# Patient Record
Sex: Female | Born: 1965
Health system: Southern US, Community
[De-identification: ages and names within clinical notes are randomized; demographics above are authoritative.]

## PROBLEM LIST (undated history)

## (undated) ENCOUNTER — Emergency Department (HOSPITAL_COMMUNITY): Admission: EM | Payer: 59

## (undated) DIAGNOSIS — F32A Depression, unspecified: Secondary | ICD-10-CM

## (undated) DIAGNOSIS — F988 Other specified behavioral and emotional disorders with onset usually occurring in childhood and adolescence: Secondary | ICD-10-CM

## (undated) DIAGNOSIS — G2581 Restless legs syndrome: Secondary | ICD-10-CM

## (undated) DIAGNOSIS — I5030 Unspecified diastolic (congestive) heart failure: Secondary | ICD-10-CM

## (undated) DIAGNOSIS — Z808 Family history of malignant neoplasm of other organs or systems: Secondary | ICD-10-CM

## (undated) DIAGNOSIS — Z8049 Family history of malignant neoplasm of other genital organs: Secondary | ICD-10-CM

## (undated) DIAGNOSIS — T7840XA Allergy, unspecified, initial encounter: Secondary | ICD-10-CM

## (undated) DIAGNOSIS — G43909 Migraine, unspecified, not intractable, without status migrainosus: Secondary | ICD-10-CM

## (undated) DIAGNOSIS — I272 Pulmonary hypertension, unspecified: Secondary | ICD-10-CM

## (undated) DIAGNOSIS — Z803 Family history of malignant neoplasm of breast: Secondary | ICD-10-CM

## (undated) DIAGNOSIS — M1990 Unspecified osteoarthritis, unspecified site: Secondary | ICD-10-CM

## (undated) DIAGNOSIS — M810 Age-related osteoporosis without current pathological fracture: Secondary | ICD-10-CM

## (undated) DIAGNOSIS — F329 Major depressive disorder, single episode, unspecified: Secondary | ICD-10-CM

## (undated) DIAGNOSIS — D151 Benign neoplasm of heart: Secondary | ICD-10-CM

## (undated) HISTORY — DX: Unspecified diastolic (congestive) heart failure: I50.30

## (undated) HISTORY — DX: Family history of malignant neoplasm of other organs or systems: Z80.8

## (undated) HISTORY — DX: Major depressive disorder, single episode, unspecified: F32.9

## (undated) HISTORY — DX: Other specified behavioral and emotional disorders with onset usually occurring in childhood and adolescence: F98.8

## (undated) HISTORY — DX: Restless legs syndrome: G25.81

## (undated) HISTORY — PX: ABDOMINAL HYSTERECTOMY: SHX81

## (undated) HISTORY — DX: Family history of malignant neoplasm of breast: Z80.3

## (undated) HISTORY — PX: GASTRIC BYPASS: SHX52

## (undated) HISTORY — DX: Unspecified osteoarthritis, unspecified site: M19.90

## (undated) HISTORY — DX: Family history of malignant neoplasm of other genital organs: Z80.49

## (undated) HISTORY — PX: TUBAL LIGATION: SHX77

## (undated) HISTORY — PX: FOOT SURGERY: SHX648

## (undated) HISTORY — DX: Pulmonary hypertension, unspecified: I27.20

## (undated) HISTORY — DX: Depression, unspecified: F32.A

## (undated) HISTORY — PX: TOTAL KNEE ARTHROPLASTY: SHX125

## (undated) HISTORY — PX: CHOLECYSTECTOMY: SHX55

## (undated) HISTORY — DX: Allergy, unspecified, initial encounter: T78.40XA

## (undated) HISTORY — DX: Benign neoplasm of heart: D15.1

---

## 1991-12-03 ENCOUNTER — Encounter: Payer: Self-pay | Admitting: Gastroenterology

## 1997-05-05 ENCOUNTER — Inpatient Hospital Stay (HOSPITAL_COMMUNITY): Admission: AD | Admit: 1997-05-05 | Discharge: 1997-05-06 | Payer: Self-pay | Admitting: Obstetrics and Gynecology

## 1997-06-24 ENCOUNTER — Inpatient Hospital Stay (HOSPITAL_COMMUNITY): Admission: AD | Admit: 1997-06-24 | Discharge: 1997-06-26 | Payer: Self-pay | Admitting: Obstetrics and Gynecology

## 1997-06-27 ENCOUNTER — Encounter (HOSPITAL_COMMUNITY): Admission: RE | Admit: 1997-06-27 | Discharge: 1997-09-25 | Payer: Self-pay | Admitting: Obstetrics and Gynecology

## 1997-09-16 ENCOUNTER — Encounter (HOSPITAL_COMMUNITY): Admission: RE | Admit: 1997-09-16 | Discharge: 1997-12-15 | Payer: Self-pay | Admitting: *Deleted

## 1998-01-15 ENCOUNTER — Encounter (HOSPITAL_COMMUNITY): Admission: RE | Admit: 1998-01-15 | Discharge: 1998-04-15 | Payer: Self-pay | Admitting: *Deleted

## 1999-04-09 ENCOUNTER — Ambulatory Visit (HOSPITAL_COMMUNITY): Admission: RE | Admit: 1999-04-09 | Discharge: 1999-04-09 | Payer: Self-pay | Admitting: Internal Medicine

## 2000-04-06 ENCOUNTER — Other Ambulatory Visit: Admission: RE | Admit: 2000-04-06 | Discharge: 2000-04-06 | Payer: Self-pay | Admitting: Obstetrics and Gynecology

## 2000-09-28 ENCOUNTER — Ambulatory Visit (HOSPITAL_COMMUNITY): Admission: RE | Admit: 2000-09-28 | Discharge: 2000-09-28 | Payer: Self-pay | Admitting: Obstetrics and Gynecology

## 2000-10-03 ENCOUNTER — Encounter (INDEPENDENT_AMBULATORY_CARE_PROVIDER_SITE_OTHER): Payer: Self-pay | Admitting: Specialist

## 2000-10-03 ENCOUNTER — Inpatient Hospital Stay (HOSPITAL_COMMUNITY): Admission: AD | Admit: 2000-10-03 | Discharge: 2000-10-07 | Payer: Self-pay | Admitting: Obstetrics and Gynecology

## 2000-11-15 ENCOUNTER — Other Ambulatory Visit: Admission: RE | Admit: 2000-11-15 | Discharge: 2000-11-15 | Payer: Self-pay | Admitting: Obstetrics and Gynecology

## 2001-07-14 ENCOUNTER — Encounter (INDEPENDENT_AMBULATORY_CARE_PROVIDER_SITE_OTHER): Payer: Self-pay

## 2001-07-14 ENCOUNTER — Ambulatory Visit (HOSPITAL_COMMUNITY): Admission: RE | Admit: 2001-07-14 | Discharge: 2001-07-14 | Payer: Self-pay | Admitting: Obstetrics and Gynecology

## 2001-11-21 ENCOUNTER — Other Ambulatory Visit: Admission: RE | Admit: 2001-11-21 | Discharge: 2001-11-21 | Payer: Self-pay | Admitting: Obstetrics and Gynecology

## 2001-11-30 ENCOUNTER — Encounter (INDEPENDENT_AMBULATORY_CARE_PROVIDER_SITE_OTHER): Payer: Self-pay | Admitting: *Deleted

## 2001-11-30 ENCOUNTER — Encounter: Payer: Self-pay | Admitting: Internal Medicine

## 2001-11-30 ENCOUNTER — Encounter: Admission: RE | Admit: 2001-11-30 | Discharge: 2001-11-30 | Payer: Self-pay | Admitting: Internal Medicine

## 2001-11-30 ENCOUNTER — Inpatient Hospital Stay (HOSPITAL_COMMUNITY): Admission: AD | Admit: 2001-11-30 | Discharge: 2001-12-01 | Payer: Self-pay | Admitting: Internal Medicine

## 2001-12-01 ENCOUNTER — Encounter: Payer: Self-pay | Admitting: General Surgery

## 2002-07-23 ENCOUNTER — Ambulatory Visit (HOSPITAL_BASED_OUTPATIENT_CLINIC_OR_DEPARTMENT_OTHER): Admission: RE | Admit: 2002-07-23 | Discharge: 2002-07-23 | Payer: Self-pay | Admitting: Orthopedic Surgery

## 2002-09-03 ENCOUNTER — Ambulatory Visit (HOSPITAL_BASED_OUTPATIENT_CLINIC_OR_DEPARTMENT_OTHER): Admission: RE | Admit: 2002-09-03 | Discharge: 2002-09-03 | Payer: Self-pay | Admitting: Orthopedic Surgery

## 2002-12-28 ENCOUNTER — Other Ambulatory Visit: Admission: RE | Admit: 2002-12-28 | Discharge: 2002-12-28 | Payer: Self-pay | Admitting: Obstetrics and Gynecology

## 2003-05-07 ENCOUNTER — Observation Stay (HOSPITAL_COMMUNITY): Admission: RE | Admit: 2003-05-07 | Discharge: 2003-05-08 | Payer: Self-pay | Admitting: Obstetrics and Gynecology

## 2003-05-07 ENCOUNTER — Encounter (INDEPENDENT_AMBULATORY_CARE_PROVIDER_SITE_OTHER): Payer: Self-pay | Admitting: Specialist

## 2004-01-23 ENCOUNTER — Ambulatory Visit: Payer: Self-pay | Admitting: Family Medicine

## 2004-01-24 ENCOUNTER — Other Ambulatory Visit: Admission: RE | Admit: 2004-01-24 | Discharge: 2004-01-24 | Payer: Self-pay | Admitting: Obstetrics and Gynecology

## 2004-05-18 ENCOUNTER — Ambulatory Visit: Payer: Self-pay | Admitting: Family Medicine

## 2004-05-28 ENCOUNTER — Ambulatory Visit: Payer: Self-pay | Admitting: Family Medicine

## 2004-06-04 ENCOUNTER — Encounter: Admission: RE | Admit: 2004-06-04 | Discharge: 2004-06-04 | Payer: Self-pay | Admitting: Obstetrics and Gynecology

## 2004-07-24 ENCOUNTER — Ambulatory Visit: Payer: Self-pay | Admitting: Family Medicine

## 2004-10-13 ENCOUNTER — Ambulatory Visit: Payer: Self-pay | Admitting: Internal Medicine

## 2005-01-04 ENCOUNTER — Ambulatory Visit: Payer: Self-pay | Admitting: Family Medicine

## 2005-02-02 ENCOUNTER — Ambulatory Visit: Payer: Self-pay | Admitting: Family Medicine

## 2005-03-24 ENCOUNTER — Ambulatory Visit: Payer: Self-pay | Admitting: Family Medicine

## 2005-03-29 ENCOUNTER — Other Ambulatory Visit: Admission: RE | Admit: 2005-03-29 | Discharge: 2005-03-29 | Payer: Self-pay | Admitting: Obstetrics and Gynecology

## 2005-06-28 ENCOUNTER — Ambulatory Visit: Payer: Self-pay | Admitting: Family Medicine

## 2005-07-06 ENCOUNTER — Ambulatory Visit: Payer: Self-pay | Admitting: Family Medicine

## 2006-05-13 ENCOUNTER — Encounter: Admission: RE | Admit: 2006-05-13 | Discharge: 2006-05-13 | Payer: Self-pay | Admitting: Obstetrics and Gynecology

## 2006-07-01 ENCOUNTER — Ambulatory Visit: Payer: Self-pay | Admitting: Family Medicine

## 2006-07-01 LAB — CONVERTED CEMR LAB
ALT: 20 units/L (ref 0–40)
AST: 18 units/L (ref 0–37)
Albumin: 4 g/dL (ref 3.5–5.2)
Alkaline Phosphatase: 42 units/L (ref 39–117)
Basophils Absolute: 0 10*3/uL (ref 0.0–0.1)
Basophils Relative: 0.5 % (ref 0.0–1.0)
Chloride: 111 meq/L (ref 96–112)
Eosinophils Absolute: 0.1 10*3/uL (ref 0.0–0.6)
Eosinophils Relative: 1.4 % (ref 0.0–5.0)
GFR calc Af Amer: 118 mL/min
GFR calc non Af Amer: 98 mL/min
HCT: 40.4 % (ref 36.0–46.0)
Hemoglobin: 13.8 g/dL (ref 12.0–15.0)
MCV: 93 fL (ref 78.0–100.0)
Monocytes Absolute: 0.5 10*3/uL (ref 0.2–0.7)
Monocytes Relative: 9.8 % (ref 3.0–11.0)
Platelets: 280 10*3/uL (ref 150–400)
Potassium: 3.8 meq/L (ref 3.5–5.1)
Sodium: 146 meq/L — ABNORMAL HIGH (ref 135–145)
TSH: 1.27 microintl units/mL (ref 0.35–5.50)
Total Protein: 7.2 g/dL (ref 6.0–8.3)
Triglycerides: 58 mg/dL (ref 0–149)
WBC: 4.8 10*3/uL (ref 4.5–10.5)

## 2006-07-08 ENCOUNTER — Ambulatory Visit: Payer: Self-pay | Admitting: Family Medicine

## 2006-08-25 ENCOUNTER — Ambulatory Visit (HOSPITAL_BASED_OUTPATIENT_CLINIC_OR_DEPARTMENT_OTHER): Admission: RE | Admit: 2006-08-25 | Discharge: 2006-08-25 | Payer: Self-pay | Admitting: Orthopaedic Surgery

## 2006-09-09 ENCOUNTER — Ambulatory Visit: Payer: Self-pay | Admitting: Family Medicine

## 2006-11-04 ENCOUNTER — Ambulatory Visit: Payer: Self-pay | Admitting: Family Medicine

## 2006-11-04 DIAGNOSIS — G4720 Circadian rhythm sleep disorder, unspecified type: Secondary | ICD-10-CM | POA: Insufficient documentation

## 2006-11-04 DIAGNOSIS — J301 Allergic rhinitis due to pollen: Secondary | ICD-10-CM | POA: Insufficient documentation

## 2006-12-02 ENCOUNTER — Ambulatory Visit: Payer: Self-pay | Admitting: Family Medicine

## 2006-12-02 DIAGNOSIS — R1310 Dysphagia, unspecified: Secondary | ICD-10-CM | POA: Insufficient documentation

## 2006-12-05 ENCOUNTER — Ambulatory Visit: Payer: Self-pay | Admitting: Internal Medicine

## 2006-12-23 ENCOUNTER — Ambulatory Visit: Payer: Self-pay | Admitting: Family Medicine

## 2006-12-23 DIAGNOSIS — M1990 Unspecified osteoarthritis, unspecified site: Secondary | ICD-10-CM | POA: Insufficient documentation

## 2006-12-23 DIAGNOSIS — R51 Headache: Secondary | ICD-10-CM | POA: Insufficient documentation

## 2006-12-23 DIAGNOSIS — J309 Allergic rhinitis, unspecified: Secondary | ICD-10-CM | POA: Insufficient documentation

## 2006-12-23 DIAGNOSIS — J45909 Unspecified asthma, uncomplicated: Secondary | ICD-10-CM | POA: Insufficient documentation

## 2006-12-23 DIAGNOSIS — R519 Headache, unspecified: Secondary | ICD-10-CM | POA: Insufficient documentation

## 2007-01-03 ENCOUNTER — Inpatient Hospital Stay (HOSPITAL_COMMUNITY): Admission: RE | Admit: 2007-01-03 | Discharge: 2007-01-05 | Payer: Self-pay | Admitting: Orthopaedic Surgery

## 2007-01-05 ENCOUNTER — Encounter (INDEPENDENT_AMBULATORY_CARE_PROVIDER_SITE_OTHER): Payer: Self-pay | Admitting: Orthopaedic Surgery

## 2007-01-05 ENCOUNTER — Ambulatory Visit: Payer: Self-pay | Admitting: Surgery

## 2007-01-11 ENCOUNTER — Encounter: Admission: RE | Admit: 2007-01-11 | Discharge: 2007-01-11 | Payer: Self-pay | Admitting: Orthopaedic Surgery

## 2007-01-16 ENCOUNTER — Encounter: Admission: RE | Admit: 2007-01-16 | Discharge: 2007-01-16 | Payer: Self-pay | Admitting: Orthopaedic Surgery

## 2007-01-25 ENCOUNTER — Telehealth: Payer: Self-pay | Admitting: Family Medicine

## 2007-01-27 ENCOUNTER — Encounter: Admission: RE | Admit: 2007-01-27 | Discharge: 2007-01-27 | Payer: Self-pay | Admitting: Orthopaedic Surgery

## 2007-03-17 ENCOUNTER — Encounter: Admission: RE | Admit: 2007-03-17 | Discharge: 2007-03-17 | Payer: Self-pay | Admitting: Orthopaedic Surgery

## 2007-05-12 ENCOUNTER — Ambulatory Visit: Payer: Self-pay | Admitting: Family Medicine

## 2007-06-07 DIAGNOSIS — I10 Essential (primary) hypertension: Secondary | ICD-10-CM | POA: Insufficient documentation

## 2007-06-07 DIAGNOSIS — K219 Gastro-esophageal reflux disease without esophagitis: Secondary | ICD-10-CM | POA: Insufficient documentation

## 2007-06-07 DIAGNOSIS — E669 Obesity, unspecified: Secondary | ICD-10-CM | POA: Insufficient documentation

## 2007-10-05 ENCOUNTER — Ambulatory Visit: Payer: Self-pay | Admitting: Family Medicine

## 2007-10-05 DIAGNOSIS — F3289 Other specified depressive episodes: Secondary | ICD-10-CM | POA: Insufficient documentation

## 2007-10-05 DIAGNOSIS — F329 Major depressive disorder, single episode, unspecified: Secondary | ICD-10-CM

## 2007-10-05 DIAGNOSIS — F988 Other specified behavioral and emotional disorders with onset usually occurring in childhood and adolescence: Secondary | ICD-10-CM | POA: Insufficient documentation

## 2007-10-06 ENCOUNTER — Encounter: Payer: Self-pay | Admitting: Family Medicine

## 2007-10-26 ENCOUNTER — Ambulatory Visit: Payer: Self-pay | Admitting: Family Medicine

## 2007-10-26 DIAGNOSIS — G2581 Restless legs syndrome: Secondary | ICD-10-CM | POA: Insufficient documentation

## 2007-10-26 DIAGNOSIS — F410 Panic disorder [episodic paroxysmal anxiety] without agoraphobia: Secondary | ICD-10-CM | POA: Insufficient documentation

## 2007-12-07 ENCOUNTER — Ambulatory Visit: Payer: Self-pay | Admitting: Family Medicine

## 2007-12-07 DIAGNOSIS — N951 Menopausal and female climacteric states: Secondary | ICD-10-CM | POA: Insufficient documentation

## 2007-12-15 ENCOUNTER — Telehealth: Payer: Self-pay | Admitting: Family Medicine

## 2007-12-18 ENCOUNTER — Ambulatory Visit: Payer: Self-pay | Admitting: Family Medicine

## 2007-12-18 ENCOUNTER — Telehealth: Payer: Self-pay | Admitting: Family Medicine

## 2007-12-19 ENCOUNTER — Telehealth: Payer: Self-pay | Admitting: Family Medicine

## 2007-12-19 LAB — CONVERTED CEMR LAB: TSH: 0.83 microintl units/mL (ref 0.35–5.50)

## 2008-01-17 ENCOUNTER — Telehealth: Payer: Self-pay | Admitting: Family Medicine

## 2008-01-18 ENCOUNTER — Encounter: Payer: Self-pay | Admitting: Family Medicine

## 2008-04-30 ENCOUNTER — Inpatient Hospital Stay (HOSPITAL_COMMUNITY): Admission: RE | Admit: 2008-04-30 | Discharge: 2008-05-01 | Payer: Self-pay | Admitting: Orthopaedic Surgery

## 2008-07-04 ENCOUNTER — Telehealth: Payer: Self-pay | Admitting: Family Medicine

## 2008-07-08 ENCOUNTER — Ambulatory Visit: Payer: Self-pay | Admitting: Family Medicine

## 2008-07-08 DIAGNOSIS — E039 Hypothyroidism, unspecified: Secondary | ICD-10-CM | POA: Insufficient documentation

## 2008-07-08 LAB — CONVERTED CEMR LAB
ALT: 13 units/L (ref 0–35)
AST: 14 units/L (ref 0–37)
Bilirubin Urine: NEGATIVE
CO2: 28 meq/L (ref 19–32)
Chloride: 111 meq/L (ref 96–112)
Creatinine, Ser: 0.6 mg/dL (ref 0.4–1.2)
Direct LDL: 111.7 mg/dL
Glucose, Bld: 76 mg/dL (ref 70–99)
HDL: 72.6 mg/dL (ref >39.00)
Potassium: 3.8 meq/L (ref 3.5–5.1)
Specific Gravity, Urine: 1.025
Total Bilirubin: 0.6 mg/dL (ref 0.3–1.2)
Total Protein: 6.9 g/dL (ref 6.0–8.3)
Triglycerides: 61 mg/dL (ref 0.0–149.0)
VLDL: 12.2 mg/dL (ref 0.0–40.0)

## 2008-07-18 ENCOUNTER — Ambulatory Visit: Payer: Self-pay | Admitting: Family Medicine

## 2008-07-24 ENCOUNTER — Ambulatory Visit: Payer: Self-pay | Admitting: Family Medicine

## 2008-07-29 ENCOUNTER — Encounter: Payer: Self-pay | Admitting: Family Medicine

## 2008-07-31 ENCOUNTER — Ambulatory Visit: Payer: Self-pay | Admitting: Family Medicine

## 2008-08-06 ENCOUNTER — Ambulatory Visit: Payer: Self-pay | Admitting: Family Medicine

## 2008-08-14 ENCOUNTER — Ambulatory Visit: Payer: Self-pay | Admitting: Family Medicine

## 2008-08-21 ENCOUNTER — Ambulatory Visit: Payer: Self-pay | Admitting: Family Medicine

## 2008-08-23 ENCOUNTER — Encounter: Payer: Self-pay | Admitting: Family Medicine

## 2008-08-28 ENCOUNTER — Ambulatory Visit: Payer: Self-pay | Admitting: Family Medicine

## 2008-09-18 ENCOUNTER — Ambulatory Visit: Payer: Self-pay | Admitting: Family Medicine

## 2008-09-20 ENCOUNTER — Ambulatory Visit: Payer: Self-pay | Admitting: Family Medicine

## 2008-09-20 DIAGNOSIS — D692 Other nonthrombocytopenic purpura: Secondary | ICD-10-CM | POA: Insufficient documentation

## 2008-09-20 LAB — CONVERTED CEMR LAB
Basophils Relative: 0.7 % (ref 0.0–3.0)
Eosinophils Relative: 1 % (ref 0.0–5.0)
Hemoglobin: 13.1 g/dL (ref 12.0–15.0)
Lymphs Abs: 1.6 10*3/uL (ref 0.7–4.0)
MCHC: 34.1 g/dL (ref 30.0–36.0)
Monocytes Relative: 8.3 % (ref 3.0–12.0)
Neutrophils Relative %: 67 % (ref 43.0–77.0)
Platelets: 240 10*3/uL (ref 150.0–400.0)
RDW: 14.2 % (ref 11.5–14.6)

## 2008-09-26 ENCOUNTER — Ambulatory Visit: Payer: Self-pay | Admitting: Family Medicine

## 2008-10-01 ENCOUNTER — Ambulatory Visit: Payer: Self-pay | Admitting: Family Medicine

## 2008-10-09 ENCOUNTER — Ambulatory Visit: Payer: Self-pay | Admitting: Family Medicine

## 2009-01-14 ENCOUNTER — Ambulatory Visit: Payer: Self-pay | Admitting: Family Medicine

## 2009-01-14 DIAGNOSIS — R209 Unspecified disturbances of skin sensation: Secondary | ICD-10-CM | POA: Insufficient documentation

## 2009-01-31 ENCOUNTER — Encounter (INDEPENDENT_AMBULATORY_CARE_PROVIDER_SITE_OTHER): Payer: Self-pay | Admitting: *Deleted

## 2009-02-11 ENCOUNTER — Ambulatory Visit: Payer: Self-pay | Admitting: Family Medicine

## 2009-02-11 ENCOUNTER — Telehealth: Payer: Self-pay | Admitting: Family Medicine

## 2009-02-20 ENCOUNTER — Ambulatory Visit: Payer: Self-pay | Admitting: Gastroenterology

## 2009-02-20 LAB — CONVERTED CEMR LAB
ALT: 25 units/L (ref 0–35)
AST: 22 units/L (ref 0–37)
Basophils Relative: 0.2 % (ref 0.0–3.0)
Bilirubin, Direct: 0.1 mg/dL (ref 0.0–0.3)
Calcium: 9 mg/dL (ref 8.4–10.5)
Chloride: 109 meq/L (ref 96–112)
Eosinophils Relative: 0.7 % (ref 0.0–5.0)
GFR calc non Af Amer: 114.94 mL/min (ref >60)
Glucose, Bld: 94 mg/dL (ref 70–99)
HCT: 42.5 % (ref 36.0–46.0)
Hemoglobin: 14.1 g/dL (ref 12.0–15.0)
IgA: 264 mg/dL (ref 68–378)
Lymphocytes Relative: 12.4 % (ref 12.0–46.0)
MCHC: 33.3 g/dL (ref 30.0–36.0)
MCV: 93.1 fL (ref 78.0–100.0)
Monocytes Relative: 5.3 % (ref 3.0–12.0)
Neutro Abs: 9.6 10*3/uL — ABNORMAL HIGH (ref 1.4–7.7)
RBC: 4.56 M/uL (ref 3.87–5.11)
Sed Rate: 26 mm/hr — ABNORMAL HIGH (ref 0–22)
Tissue Transglutaminase Ab, IgA: 0.5 units (ref <7)
Total Protein: 7.4 g/dL (ref 6.0–8.3)
WBC: 11.8 10*3/uL — ABNORMAL HIGH (ref 4.5–10.5)

## 2009-02-21 ENCOUNTER — Ambulatory Visit: Payer: Self-pay | Admitting: Gastroenterology

## 2009-02-21 ENCOUNTER — Telehealth: Payer: Self-pay | Admitting: Family Medicine

## 2009-02-26 ENCOUNTER — Encounter: Payer: Self-pay | Admitting: Gastroenterology

## 2009-03-25 ENCOUNTER — Telehealth: Payer: Self-pay | Admitting: Gastroenterology

## 2009-03-31 ENCOUNTER — Ambulatory Visit (HOSPITAL_COMMUNITY): Admission: RE | Admit: 2009-03-31 | Discharge: 2009-03-31 | Payer: Self-pay | Admitting: Gastroenterology

## 2009-03-31 ENCOUNTER — Encounter: Payer: Self-pay | Admitting: Gastroenterology

## 2009-04-03 ENCOUNTER — Ambulatory Visit: Payer: Self-pay | Admitting: Gastroenterology

## 2009-04-04 ENCOUNTER — Telehealth: Payer: Self-pay | Admitting: Gastroenterology

## 2009-04-17 ENCOUNTER — Ambulatory Visit: Payer: Self-pay | Admitting: Gastroenterology

## 2009-04-17 ENCOUNTER — Encounter (INDEPENDENT_AMBULATORY_CARE_PROVIDER_SITE_OTHER): Payer: Self-pay | Admitting: *Deleted

## 2009-04-17 LAB — CONVERTED CEMR LAB
Eosinophils Absolute: 0.1 10*3/uL (ref 0.0–0.7)
HCT: 36.2 % (ref 36.0–46.0)
MCHC: 33.7 g/dL (ref 30.0–36.0)
MCV: 92.1 fL (ref 78.0–100.0)
Monocytes Absolute: 0.6 10*3/uL (ref 0.1–1.0)
Neutrophils Relative %: 63.8 % (ref 43.0–77.0)
RBC: 3.93 M/uL (ref 3.87–5.11)
RDW: 11.7 % (ref 11.5–14.6)
WBC: 6.6 10*3/uL (ref 4.5–10.5)

## 2009-04-25 ENCOUNTER — Ambulatory Visit: Payer: Self-pay | Admitting: Gastroenterology

## 2009-04-30 ENCOUNTER — Encounter: Payer: Self-pay | Admitting: Gastroenterology

## 2009-05-09 ENCOUNTER — Telehealth: Payer: Self-pay | Admitting: Gastroenterology

## 2009-05-13 ENCOUNTER — Ambulatory Visit: Payer: Self-pay | Admitting: Family Medicine

## 2009-05-13 DIAGNOSIS — L568 Other specified acute skin changes due to ultraviolet radiation: Secondary | ICD-10-CM | POA: Insufficient documentation

## 2009-05-22 ENCOUNTER — Telehealth: Payer: Self-pay | Admitting: Family Medicine

## 2009-06-11 ENCOUNTER — Telehealth: Payer: Self-pay | Admitting: Family Medicine

## 2009-06-16 ENCOUNTER — Encounter: Payer: Self-pay | Admitting: Family Medicine

## 2009-07-25 ENCOUNTER — Ambulatory Visit: Payer: Self-pay | Admitting: Family Medicine

## 2009-08-25 ENCOUNTER — Encounter: Payer: Self-pay | Admitting: *Deleted

## 2009-08-25 LAB — HM MAMMOGRAPHY

## 2009-08-26 ENCOUNTER — Ambulatory Visit: Payer: Self-pay | Admitting: Family Medicine

## 2009-11-12 ENCOUNTER — Telehealth: Payer: Self-pay | Admitting: Family Medicine

## 2009-11-14 ENCOUNTER — Ambulatory Visit: Payer: Self-pay | Admitting: Internal Medicine

## 2009-11-14 ENCOUNTER — Encounter: Payer: Self-pay | Admitting: Family Medicine

## 2009-11-26 ENCOUNTER — Telehealth: Payer: Self-pay | Admitting: Family Medicine

## 2009-11-28 ENCOUNTER — Ambulatory Visit: Payer: Self-pay | Admitting: Family Medicine

## 2009-12-22 ENCOUNTER — Telehealth: Payer: Self-pay | Admitting: Family Medicine

## 2009-12-29 ENCOUNTER — Ambulatory Visit: Payer: Self-pay | Admitting: Family Medicine

## 2009-12-29 LAB — CONVERTED CEMR LAB
Cholesterol: 153 mg/dL (ref 0–200)
Glucose, Bld: 77 mg/dL (ref 70–99)
Hemoglobin: 11.8 g/dL — ABNORMAL LOW (ref 12.0–15.0)
Total Protein: 6.5 g/dL (ref 6.0–8.3)

## 2009-12-31 ENCOUNTER — Ambulatory Visit (HOSPITAL_COMMUNITY): Admission: RE | Admit: 2009-12-31 | Discharge: 2009-12-31 | Payer: Self-pay | Admitting: Orthopaedic Surgery

## 2009-12-31 ENCOUNTER — Ambulatory Visit: Payer: Self-pay | Admitting: Vascular Surgery

## 2009-12-31 ENCOUNTER — Encounter (INDEPENDENT_AMBULATORY_CARE_PROVIDER_SITE_OTHER): Payer: Self-pay | Admitting: Orthopaedic Surgery

## 2010-01-01 ENCOUNTER — Telehealth: Payer: Self-pay | Admitting: Family Medicine

## 2010-03-02 ENCOUNTER — Ambulatory Visit: Payer: Self-pay | Admitting: Family Medicine

## 2010-03-26 ENCOUNTER — Telehealth: Payer: Self-pay | Admitting: Family Medicine

## 2010-04-06 ENCOUNTER — Other Ambulatory Visit: Payer: Self-pay | Admitting: Family Medicine

## 2010-04-06 ENCOUNTER — Ambulatory Visit
Admission: RE | Admit: 2010-04-06 | Discharge: 2010-04-06 | Payer: Self-pay | Source: Home / Self Care | Attending: Family Medicine | Admitting: Family Medicine

## 2010-04-06 LAB — CBC WITH DIFFERENTIAL/PLATELET
Basophils Absolute: 0.1 10*3/uL (ref 0.0–0.1)
Basophils Relative: 1.3 % (ref 0.0–3.0)
Eosinophils Absolute: 0.1 10*3/uL (ref 0.0–0.7)
Eosinophils Relative: 2.9 % (ref 0.0–5.0)
HCT: 36.9 % (ref 36.0–46.0)
Hemoglobin: 12.5 g/dL (ref 12.0–15.0)
Lymphocytes Relative: 35.6 % (ref 12.0–46.0)
Lymphs Abs: 1.7 10*3/uL (ref 0.7–4.0)
MCHC: 33.9 g/dL (ref 30.0–36.0)
MCV: 91.3 fl (ref 78.0–100.0)
Monocytes Absolute: 0.3 10*3/uL (ref 0.1–1.0)
Monocytes Relative: 6.6 % (ref 3.0–12.0)
Neutro Abs: 2.6 10*3/uL (ref 1.4–7.7)
Neutrophils Relative %: 53.6 % (ref 43.0–77.0)
Platelets: 194 10*3/uL (ref 150.0–400.0)
RBC: 4.05 Mil/uL (ref 3.87–5.11)
RDW: 14.6 % (ref 11.5–14.6)
WBC: 4.9 10*3/uL (ref 4.5–10.5)

## 2010-04-12 ENCOUNTER — Encounter: Payer: Self-pay | Admitting: Orthopaedic Surgery

## 2010-04-21 NOTE — Letter (Signed)
Summary: Patient [facility] Biopsy Results  [facility]  [address] [place], [place] [address]   Phone: [phone]  Fax: [phone]        April 30, 2009 MRN: [id number]    [facility] [address] [place], [place]  [address]    Dear Ms. Shelburne,  I am pleased to inform you that the biopsies taken during your recent endoscopic examination did not show any evidence of cancer upon pathologic examination.THERE IS PERSISTENT CANDIDA PRESENT...NO EOSINOPHILIC ESOPHAGITIS.  Additional information/recommendations:  __No further action is needed at this time.  Please follow-up with      your primary care physician for your other healthcare needs.  __ Please call [phone] to schedule a return visit to review      your condition.  XX__ Continue with the treatment plan as outlined on the day of your      exam.  __ You should have a repeat endoscopic examination for this problem              in _ months/years.   Please call us if you are having persistent problems or have questions about your condition that have not been fully answered at this time.  Sincerely,  Mardella Layman MD [facility]  This letter has been electronically signed by your physician.  Appended Document: Patient Notice-Endo Biopsy Results Letter mailed 2.10.11

## 2010-04-21 NOTE — Progress Notes (Signed)
Summary: needs test results and labs  ordered  Phone Note Call from Patient Call back at Work Phone [phone]   Caller: Patient--live call Summary of Call: need bone density results. wants order for labs. Dr Tawanna Cooler told pt to call Fleet Contras for this. please return call. Initial call taken by: Warnell Forester,  December 22, 2009 9:08 AM  Follow-up for Phone Call        left message on machine returning patient's call Follow-up by: Kern Reap CMA Duncan Dull),  December 22, 2009 4:52 PM  Additional Follow-up for Phone Call Additional follow up Details #1::        patient would like bone density test results and needs labs for follow up on surgery.  she will call with the labs requested. Additional Follow-up by: Kern Reap CMA Duncan Dull),  December 22, 2009 4:56 PM    Additional Follow-up for Phone Call Additional follow up Details #2::    Fleet Contras please call......Marland Kitchen bone density normal.  Recommend calcium, vitamin D 20 minutes of walking daily to maintain bone strength Follow-up by: Roderick Pee MD,  December 23, 2009 8:18 AM  Additional Follow-up for Phone Call Additional follow up Details #3:: Details for Additional Follow-up Action Taken: patient is aware and lab appointment made Additional Follow-up by: Kern Reap CMA Duncan Dull),  December 23, 2009 3:45 PM

## 2010-04-21 NOTE — Miscellaneous (Signed)
Summary: BONE DENSITY  Clinical Lists Changes  Orders: Added new Test order of T-Bone Densitometry (77080) - Signed Added new Test order of T-Lumbar Vertebral Assessment (77082) - Signed 

## 2010-04-21 NOTE — Procedures (Signed)
Summary: manometry   Esophageal Manometry  Procedure date:  03/31/2009  Findings:      abnormal:   Esophageal manometry was completed on April 01, 2009. Results are as follows:  #1.upper esophageal sphincter-normal coordination between pharyngeal contraction and cricopharyngeal relaxation  #2. Lower esophageal sphincter-pressure is low at 11 mm of mercury with normal relaxation of swallowing.  #3. Esophageal peristalsis is normal with mean amplitude of contraction 120 mmHg and 100% peristalsis.  Assessment: This is a normal manometry except for lower esophageal sphincter incompetency. There is no evidence of an esophageal motility disorder.  Recommendations: We will intensify her reflux regime and consider fundoplication surgery. As Dr. Vania Rea. Jarold Motto M.D.  Appended Document: manometry copy Dr. Luretha Murphy and please set up followup clinic visit with me.  Appended Document: manometry Appt scheduled.

## 2010-04-21 NOTE — Progress Notes (Signed)
Summary: Requesting Letter for Bariatric Surgery  Phone Note Call from Patient Call back at Work Phone [phone]   Caller: Patient Summary of Call: Needs a letter from Dr. Tawanna Cooler, undergoing approval process for bariatric surgery. Initial call taken by: Trixie Dredge,  June 11, 2009 3:44 PM  Follow-up for Phone Call        ok to send office notes for one year....is this what she wants ?????????? Follow-up by: Roderick Pee MD,  June 15, 2009 12:13 PM  Additional Follow-up for Phone Call Additional follow up Details #1::        Phone Call Completed Additional Follow-up by: Kern Reap CMA Duncan Dull),  June 16, 2009 4:47 PM

## 2010-04-21 NOTE — Assessment & Plan Note (Signed)
Summary: Follow Up Manometry/dfs   History of Present Illness Visit Type: Follow-up Visit Primary GI MD: Sheryn Bison MD FACP FAGA Chief Complaint: F/u manometry. Pt still has some solid food dysphagia. History of Present Illness:   This 45 year old Caucasian female continues with dysphagia and globus sensation. Endoscopy in early December showed Candida esophagitis and she was treated with 10 days of Diflucan therapy without improvement. Esophageal manometry showed no evidence of an esophageal motility disturbance. She has numerous allergies and has steroid inhalers. Trials of PPI therapy have not helped her burning substernal chest pain or dysphasia.   GI Review of Systems    Reports dysphagia with solids.      Denies abdominal pain, acid reflux, belching, bloating, chest pain, dysphagia with liquids, heartburn, loss of appetite, nausea, vomiting, vomiting blood, weight loss, and  weight gain.        Denies anal fissure, black tarry stools, change in bowel habit, constipation, diarrhea, diverticulosis, fecal incontinence, heme positive stool, hemorrhoids, irritable bowel syndrome, jaundice, light color stool, liver problems, rectal bleeding, and  rectal pain.    Current Medications (verified): 1)  Proventil Hfa 108 (90 Base) Mcg/act Aers (Albuterol Sulfate) .... Inhale 2 Puff As Directed Four Times A Day 2)  Topamax 50 Mg Tabs (Topiramate) .... Take 1 Tablet By Mouth Two Times A Day 3)  Zomig Zmt 5 Mg Tbdp (Zolmitriptan) .... As Needed 4)  Lyrica 100 Mg  Caps (Pregabalin) .... Take One Three Times A Day 5)  Robaxin 100 Mg/ml Soln (Methocarbamol) .Marland Kitchen.. 1 At Bedtime 6)  Norco 10-325 Mg Tabs (Hydrocodone-Acetaminophen) .... One and Half Tab At Bedtime 7)  Zyrtec Allergy 10 Mg Tabs (Cetirizine Hcl) .... Once Daily 8)  Daily Multi  Tabs (Multiple Vitamins-Minerals) .... Once Daily 9)  Dexilant 60 Mg Cpdr (Dexlansoprazole) .Marland Kitchen.. 1 By Mouth Qd 10)  Mirapex 1.5 Mg Tabs (Pramipexole  Dihydrochloride) .Marland Kitchen.. 1 Tab @ Bedtime 11)  Flovent Hfa 110 Mcg/act Aero (Fluticasone Propionate  Hfa) .... Take 2 Puffs Two Times A Day  Allergies (verified): No Known Drug Allergies  Past History:  Past medical history reviewed for relevance to current acute and chronic problems.  Past Medical History: Reviewed history from 12/07/2007 and no changes required. Allergic rhinitis Asthma Headache Osteoarthritis left total knee replacement, October 2008 Depression ADD restless leg syndrome perimenopausal symptoms  Past Surgical History: Reviewed history from 02/20/2009 and no changes required. GALLBLADDER SURGERY 2003 HYSTERECTOMY 2003 TUBAL LIGATION 2002 FOOT SURGERY 1992/2007 Knee Replacement bilateral  Family History: Reviewed history from 02/20/2009 and no changes required. Family History Depression Family History of Breast Cancer: MGM, Mat Aunt x 2 No FH of Colon Cancer: Family History of Uterine Cancer: Sister Family History of Thyroid Cancer: Mother  Social History: Reviewed history from 02/20/2009 and no changes required. she works for [employer] in [employer] [employer].  She is very stressed out about the recent bank failure and by out. Married, 2 boys, 1 girl Patient is a former smoker.  Alcohol Use - yes 1/month Daily Caffeine Use 2/day Illicit Drug Use - no  Review of Systems       The patient complains of allergy/sinus and cough.  The patient denies anemia, anxiety-new, arthritis/joint pain, back pain, blood in urine, breast changes/lumps, change in vision, confusion, coughing up blood, depression-new, fainting, fatigue, fever, headaches-new, hearing problems, heart murmur, heart rhythm changes, itching, menstrual pain, muscle pains/cramps, night sweats, nosebleeds, pregnancy symptoms, shortness of breath, skin rash, sleeping problems, sore throat, swelling of feet/legs, swollen lymph  glands, thirst - excessive , urination - excessive , urination changes/pain, urine  leakage, vision changes, and voice change.   ENT:  Complains of difficulty swallowing; denies earache, ear discharge, tinnitus, decreased hearing, nasal congestion, loss of smell, nosebleeds, sore throat, and hoarseness. Allergy:  Complains of sneezing and hay fever; denies hives, rash, and recurrent infections; She does give a history of multiple food allergies but has not been tested in many years..  Vital Signs:  Patient profile:   45 year old female Menstrual status:  hysterectomy Height:      67 inches Weight:      251 pounds BMI:     39.45 Pulse rate:   78 / minute Pulse rhythm:   regular BP sitting:   128 / 70  (left arm)  Vitals Entered By: Ashok Cordia RN (April 17, 2009 9:04 AM)  Physical Exam  General:  Well developed, well nourished, no acute distress.healthy appearing and obese.   Head:  Normocephalic and atraumatic. Eyes:  PERRLA, no icterus. Psych:  Alert and cooperative. Normal mood and affect.   Impression & Recommendations:  Problem # 1:  DYSPHAGIA, UNSPECIFIED (ICD-787.20) Assessment Unchanged We will repeat her endoscopy and obtain biopsies for eosinophilic esophagitis. Also this will allow confirmation of Candida previous treatment. If endoscopy is entirely normal I would recommend empiric large caliber bougie dilation for possible occult stricture from GERD. However, this patient seems to be an ideal candidate for eosinophilic-allergic esophagitis. Also endoscopy will help [place] determine whether or not we need to continue PPI therapy. If she has recurrent Candida infection, further immune workup would be in order. Will check CBC and differential today to see if she has peripheral eosinophilia. TLB-CBC Platelet - w/Differential (85025-CBCD)  Problem # 2:  HYPERTENSION (ICD-401.9) Assessment: Improved Blood Pressure today is 128/70 and a batch continue all of her other multiple medications listed and reviewed her chart per Dr. Kelle Darting  Patient  Instructions: 1)  Copy sent to : Dr. Kelle Darting 2)  Conscious Sedation brochure given.  3)  Upper Endoscopy with Dilatation brochure given.  4)  Please continue current medications.  5)  The medication list was reviewed and reconciled.  All changed / newly prescribed medications were explained.  A complete medication list was provided to the patient / caregiver.  Appended Document: Follow Up Manometry/dfs    Clinical Lists Changes  Orders: Added new Test order of EGD (EGD) - Signed

## 2010-04-21 NOTE — Progress Notes (Signed)
Summary: ? re meds  Phone Note Call from Patient Call back at Work Phone [phone]   Caller: Patient Call For: Jarold Motto Reason for Call: Talk to Nurse Summary of Call: Patient has question on whether she should take Dexilant or not Initial call taken by: Tawni Levy,  May 09, 2009 1:50 PM  Follow-up for Phone Call        Per procedure note pt is to stop Dexilant.  Pt notified. Follow-up by: Ashok Cordia RN,  May 09, 2009 2:08 PM

## 2010-04-21 NOTE — Progress Notes (Signed)
Summary: BMD  Phone Note Call from Patient Call back at Work Phone [phone]   Caller: Patient Call For: Roderick Pee MD Reason for Call: Talk to Doctor Summary of Call: patient is calling because she has fractured her right ankle.  she had surgery and her ortho has advised her to have a BMD.  is this okay to order? Initial call taken by: Kern Reap CMA Duncan Dull),  November 12, 2009 12:41 PM  Follow-up for Phone Call        ok.dx........Marland Kitchenfracture r/o osterporosis Follow-up by: Roderick Pee MD,  November 13, 2009 7:17 AM  Additional Follow-up for Phone Call Additional follow up Details #1::        pt will call back  Additional Follow-up by: Heron Sabins,  November 13, 2009 11:10 AM    Additional Follow-up for Phone Call Additional follow up Details #2::    bmd sch for 11-13-2009 10am Follow-up by: Heron Sabins,  November 13, 2009 11:36 AM

## 2010-04-21 NOTE — Letter (Signed)
Summary: Generic Letter  [place] [facility]  [address] [place], [place] [address]   Phone: [phone]  Fax: [phone]    06/16/2009  [facility] [place] [address] [place], [place]  [address]  To Whom it May Concern,  Our patient Melissa [name] is under our care.  She is medically cleared for gastric bypass procedure.  She will also be under our care after the procedure is completed.  If you have any questions or concerns please feel free to call our office.          Sincerely,   Kelle Darting, MD

## 2010-04-21 NOTE — Progress Notes (Signed)
Summary: Dr Willa Rough    Caller: Dr Willa Rough Summary of Call: call regarding patient- no urgency  phone [phone] Initial call taken by: Raechel Ache, RN,  May 22, 2009 11:24 AM

## 2010-04-21 NOTE — Progress Notes (Signed)
Summary: sleep problem  Phone Note Call from Patient Call back at Work Phone [phone]   Caller: vm Summary of Call: Recent CPX.  Can you prescribe anything to help me sleep?  Few months now, go to sleep easily, but in about 3 hrs, wake & have trouble getting back to sleep & staying asleep last part of night, problems with daytime sleepiness.  New since gastric bypass surgery.  [facility].     Initial call taken by: Rudy Jew, RN,  November 26, 2009 2:24 PM  Follow-up for Phone Call        Please advise.........Marland KitchenFleet Contras please call and review all her medications, so we can see what her options are Follow-up by: Lucious Groves CMA,  November 26, 2009 3:48 PM  Additional Follow-up for Phone Call Additional follow up Details #1::        left message on machine for patient to return our call Additional Follow-up by: Kern Reap CMA Duncan Dull),  November 27, 2009 2:02 PM    Additional Follow-up for Phone Call Additional follow up Details #2::    actigall was added.  She tried melatonin with no help and it was causing migraines. Follow-up by: Kern Reap CMA Duncan Dull),  November 27, 2009 2:46 PM  Additional Follow-up for Phone Call Additional follow up Details #3:: Details for Additional Follow-up Action Taken: because she is already taking Mirapex and Topamax.  I need to see her and talked to her about her options  New/Updated Medications: ACTIGALL 300 MG CAPS (URSODIOL) take one tab by mouth two times a day

## 2010-04-21 NOTE — Assessment & Plan Note (Signed)
Summary: F/U POST GASTRIC BYPASS SURGERY // RS   Vital Signs:  Patient profile:   45 year old female Menstrual status:  hysterectomy Weight:      242 pounds Temp:     98.1 degrees F oral BP sitting:   124 / 80  (left arm) Cuff size:   regular  Vitals Entered By: Kern Reap CMA Duncan Dull) (Jul 25, 2009 10:03 AM) CC: follow-up visit   CC:  follow-up visit.  History of Present Illness: Melissa [name] is a 45 year old, married female, nonsmoker, who comes in today following gastric banding.  She had surgery two weeks ago.  Since that, time.  She's lost 14 and pounds.  They added Actigall 300 mg b.i.d. and Prilosec 20 mg b.i.d. to her medical regime.  With the weight, loss she's had to use less of a muscle relaxant and pain pills.  She states only once a week ago.  She needed to take her medication for her back.  She is also off caffeine and wonders if we can decrease or taper.  The Topamax.  The caffeine was most likely a big trigger for her migraines.  She would also like to decrease or eliminate the Mirapex.  Allergies: No Known Drug Allergies  Past History:  Past medical, surgical, family and social histories (including risk factors) reviewed for relevance to current acute and chronic problems.  Past Medical History: Reviewed history from 12/07/2007 and no changes required. Allergic rhinitis Asthma Headache Osteoarthritis left total knee replacement, October 2008 Depression ADD restless leg syndrome perimenopausal symptoms  Past Surgical History: Reviewed history from 02/20/2009 and no changes required. GALLBLADDER SURGERY 2003 HYSTERECTOMY 2003 TUBAL LIGATION 2002 FOOT SURGERY 1992/2007 Knee Replacement bilateral  Family History: Reviewed history from 02/20/2009 and no changes required. Family History Depression Family History of Breast Cancer: MGM, Mat Aunt x 2 No FH of Colon Cancer: Family History of Uterine Cancer: Sister Family History of Thyroid Cancer:  Mother  Social History: Reviewed history from 02/20/2009 and no changes required. she works for [employer] in [place].  She is very stressed out about the recent bank failure and by out. Married, 2 boys, 1 girl Patient is a former smoker.  Alcohol Use - yes 1/month Daily Caffeine Use 2/day Illicit Drug Use - no  Review of Systems      See HPI  Physical Exam  General:  Well-developed,well-nourished,in no acute distress; alert,appropriate and cooperative throughout examination   Impression & Recommendations:  Problem # 1:  HEADACHE (ICD-784.0) Assessment Improved  Her updated medication list for this problem includes:    Zomig Zmt 5 Mg Tbdp (Zolmitriptan) .Marland Kitchen... As needed    Norco 10-325 Mg Tabs (Hydrocodone-acetaminophen) ..... One and half tab at bedtime  Problem # 2:  OBESITY (ICD-278.00) Assessment: Improved  Complete Medication List: 1)  Proventil Hfa 108 (90 Base) Mcg/act Aers (Albuterol sulfate) .... Inhale 2 puff as directed four times a day 2)  Topamax 50 Mg Tabs (Topiramate) .... Take 1 tablet by mouth two times a day 3)  Zomig Zmt 5 Mg Tbdp (Zolmitriptan) .... As needed 4)  Lyrica 100 Mg Caps (Pregabalin) .... Take one three times a day 5)  Robaxin 100 Mg/ml Soln (Methocarbamol) .Marland Kitchen.. 1 at bedtime 6)  Norco 10-325 Mg Tabs (Hydrocodone-acetaminophen) .... One and half tab at bedtime 7)  Zyrtec Allergy 10 Mg Tabs (Cetirizine hcl) .... Once daily 8)  Daily Multi Tabs (Multiple vitamins-minerals) .... Once daily 9)  Mirapex 1.5 Mg Tabs (Pramipexole dihydrochloride) .Marland KitchenMarland KitchenMarland Kitchen  1 tab @ bedtime 10)  Flovent Hfa 110 Mcg/act Aero (Fluticasone propionate  hfa) .... Take 2 puffs two times a day 11)  Diflucan 100 Mg Tabs (Fluconazole) .... Diflucan 100 mg two times a day x 1 day, then daily x 2 weeks 12)  Prednisone 20 Mg Tabs (Prednisone) .... Uad 13)  Actigall 300 Mg Caps (Ursodiol) .... Take one tab by mouth two times a day 14)  Prilosec Otc 20 Mg Tbec (Omeprazole magnesium) ....  Take one tab by mouth two times a day  Patient Instructions: 1)  slowly taper the Topamax...Marland KitchenMarland KitchenMarland Kitchen begin by taking a half a tablet in the morning and a full tablet at bedtime, then decrease by half a tablet every two weeks to see if we can get you off the Topamax. 2)  Set up a time in the next two to 4 weeks for a 30 minute appointment for general medical exam. 3)  Bring a copy of all your laboratory tests

## 2010-04-21 NOTE — Miscellaneous (Signed)
Summary: diflucan rx  Clinical Lists Changes  Medications: Added new medication of DIFLUCAN 100 MG  TABS (FLUCONAZOLE) diflucan 100 mg two times a day x 1 day, then daily x 2 weeks - Signed Rx of DIFLUCAN 100 MG  TABS (FLUCONAZOLE) diflucan 100 mg two times a day x 1 day, then daily x 2 weeks;  #25 x 0;  Signed;  Entered by: Weston Brass;  Authorized by: Mardella Layman MD [facility];  Method used: Electronically to [facility] # [id number]*, [address], [place], [place]  [address], Ph: [phone], Fax: [phone] Observations: Added new observation of NKA: T (04/25/2009 13:59)    Prescriptions: DIFLUCAN 100 MG  TABS (FLUCONAZOLE) diflucan 100 mg two times a day x 1 day, then daily x 2 weeks  #25 x 0   Entered by:   Weston Brass   Authorized by:   Mardella Layman MD [facility]   Signed by:   Weston Brass on 04/25/2009   Method used:   Electronically to        [facility] # [phone]* (retail)       [address]       [place], [place]  [address]       Ph: [phone]       Fax: [phone]   RxID:   [id number]

## 2010-04-21 NOTE — Progress Notes (Signed)
Summary: Dexilant Rx  Phone Note From Pharmacy   Summary of Call: Rx requested for 90 day supply of Dexilant to be sent to [facility]. Initial call taken by: Ashok Cordia RN,  April 04, 2009 2:43 PM    Prescriptions: DEXILANT 60 MG CPDR (DEXLANSOPRAZOLE) 1 by mouth qd  #90 x 4   Entered by:   Ashok Cordia RN   Authorized by:   Mardella Layman MD [facility]   Signed by:   Ashok Cordia RN on 04/04/2009   Method used:   Electronically to        [facility]* (mail-order)             ,          Ph: [phone]       Fax: [phone]   RxID:   [id number]

## 2010-04-21 NOTE — Assessment & Plan Note (Signed)
Summary: facial rash/dm   Vital Signs:  Patient profile:   45 year old female Menstrual status:  hysterectomy Weight:      248 pounds Temp:     98.8 degrees F oral BP sitting:   140 / 90  (left arm) Cuff size:   regular  Vitals Entered By: Kern Reap CMA Duncan Dull) (May 13, 2009 8:39 AM)  Reason for Visit face and neck rash  History of Present Illness: Melissa [name] is a 45 year old, married female, nonsmoker, who comes in today for rash.  She said her second episode of fungal......... Candida..... esophagitis.  She was placed on Diflucan 100 mg daily starting February 4.  A week later, she noticed some erythema and itching and redness of her neck.  She stop the Diflucan on the 18th of February, but the rash persists, just on sun exposed areas.  Face and neck.  She's never had a skin rash like this before.  Review of systems otherwise negative.  Again, this is her second episode of Candida esophagitis.  Dr. Jarold Motto recommended that she get an immunologic workup.  I will therefore refer her to Dr. Reather Converse.  Allergies (verified): No Known Drug Allergies  Past History:  Past medical, surgical, family and social histories (including risk factors) reviewed for relevance to current acute and chronic problems.  Past Medical History: Reviewed history from 12/07/2007 and no changes required. Allergic rhinitis Asthma Headache Osteoarthritis left total knee replacement, October 2008 Depression ADD restless leg syndrome perimenopausal symptoms  Past Surgical History: Reviewed history from 02/20/2009 and no changes required. GALLBLADDER SURGERY 2003 HYSTERECTOMY 2003 TUBAL LIGATION 2002 FOOT SURGERY 1992/2007 Knee Replacement bilateral  Family History: Reviewed history from 02/20/2009 and no changes required. Family History Depression Family History of Breast Cancer: MGM, Mat Aunt x 2 No FH of Colon Cancer: Family History of Uterine Cancer: Sister Family History of  Thyroid Cancer: Mother  Social History: Reviewed history from 02/20/2009 and no changes required. she works for [employer] in [employer] [employer].  She is very stressed out about the recent bank failure and by out. Married, 2 boys, 1 girl Patient is a former smoker.  Alcohol Use - yes 1/month Daily Caffeine Use 2/day Illicit Drug Use - no  Review of Systems      See HPI  Physical Exam  General:  Well-developed,well-nourished,in no acute distress; alert,appropriate and cooperative throughout examination Skin:  erythema over the face and neck.  No discrete rash   Impression & Recommendations:  Problem # 1:  PHOTOALLERGIC DERMATITIS (ICD-692.72) Assessment New  Her updated medication list for this problem includes:    Zyrtec Allergy 10 Mg Tabs (Cetirizine hcl) ..... Once daily    Prednisone 20 Mg Tabs (Prednisone) ..... Uad  Complete Medication List: 1)  Proventil Hfa 108 (90 Base) Mcg/act Aers (Albuterol sulfate) .... Inhale 2 puff as directed four times a day 2)  Topamax 50 Mg Tabs (Topiramate) .... Take 1 tablet by mouth two times a day 3)  Zomig Zmt 5 Mg Tbdp (Zolmitriptan) .... As needed 4)  Lyrica 100 Mg Caps (Pregabalin) .... Take one three times a day 5)  Robaxin 100 Mg/ml Soln (Methocarbamol) .Marland Kitchen.. 1 at bedtime 6)  Norco 10-325 Mg Tabs (Hydrocodone-acetaminophen) .... One and half tab at bedtime 7)  Zyrtec Allergy 10 Mg Tabs (Cetirizine hcl) .... Once daily 8)  Daily Multi Tabs (Multiple vitamins-minerals) .... Once daily 9)  Mirapex 1.5 Mg Tabs (Pramipexole dihydrochloride) .Marland Kitchen.. 1 tab @ bedtime 10)  Flovent Hfa 110  Mcg/act Aero (Fluticasone propionate  hfa) .... Take 2 puffs two times a day 11)  Diflucan 100 Mg Tabs (Fluconazole) .... Diflucan 100 mg two times a day x 1 day, then daily x 2 weeks 12)  Prednisone 20 Mg Tabs (Prednisone) .... Uad  Patient Instructions: 1)  take prednisone one tablet daily, x 3 days, a half x 3 days, then half a tablet Monday, Wednesday, Friday, for a  two week taper. 2)  Call Dr. Reather Converse immunologist for consult Prescriptions: MIRAPEX 1.5 MG TABS (PRAMIPEXOLE DIHYDROCHLORIDE) 1 tab @ bedtime  #100 x 3   Entered and Authorized by:   Roderick Pee MD   Signed by:   Roderick Pee MD on 05/13/2009   Method used:   Print then Give to Patient   RxID:   [id number] ROBAXIN 100 MG/ML SOLN (METHOCARBAMOL) 1 at bedtime  #0 x 0   Entered and Authorized by:   Roderick Pee MD   Signed by:   Roderick Pee MD on 05/13/2009   Method used:   Print then Give to Patient   RxID:   [id number] LYRICA 100 MG  CAPS (PREGABALIN) take one three times a day  #300 x 4   Entered and Authorized by:   Roderick Pee MD   Signed by:   Roderick Pee MD on 05/13/2009   Method used:   Print then Give to Patient   RxID:   [id number] ZOMIG ZMT 5 MG TBDP (ZOLMITRIPTAN) as needed  #6.0 Each x 2   Entered and Authorized by:   Roderick Pee MD   Signed by:   Roderick Pee MD on 05/13/2009   Method used:   Print then Give to Patient   RxID:   [id number] TOPAMAX 50 MG TABS (TOPIRAMATE) Take 1 tablet by mouth two times a day  #200 x 3   Entered and Authorized by:   Roderick Pee MD   Signed by:   Roderick Pee MD on 05/13/2009   Method used:   Print then Give to Patient   RxID:   [id number] PREDNISONE 20 MG TABS (PREDNISONE) UAD  #30 x 0   Entered and Authorized by:   Roderick Pee MD   Signed by:   Roderick Pee MD on 05/13/2009   Method used:   Print then Give to Patient   RxID:   [id number]

## 2010-04-21 NOTE — Letter (Signed)
Summary: EGD Instructions  [facility]  [address] [place], [place] [address]   Phone: [phone]  Fax: [phone]       BEVIN DAS    Dec 13, 1965    MRN: [id number]       Procedure Day Dorna Bloom: Firday, 04/25/09     Arrival Time: 12:30     Procedure Time: 1:30     Location of Procedure:                    [facility]ian[facility] Pares  _ [facility] (4th Floor)   PREPARATION FOR ENDOSCOPY   On 04/25/09 THE DAY OF THE PROCEDURE:  1.   No solid foods, milk or milk products are allowed after midnight the night before your procedure.  2.   Do not drink anything colored red or purple.  Avoid juices with pulp.  No orange juice.  3.  You may drink clear liquids until 11:30, which is 2 hours before your procedure.                                                                                                CLEAR LIQUIDS INCLUDE: Water Jello Ice Popsicles Tea (sugar ok, no milk/cream) Powdered fruit flavored drinks Coffee (sugar ok, no milk/cream) Gatorade Juice: apple, white grape, white cranberry  Lemonade Clear bullion, consomm, broth Carbonated beverages (any kind) Strained chicken noodle soup Hard Candy   MEDICATION INSTRUCTIONS  Unless otherwise instructed, you should take regular prescription medications with a small sip of water as early as possible the morning of your procedure.                  OTHER INSTRUCTIONS  You will need a responsible adult at least 47 years of age to accompany you and drive you home.   This person must remain in the waiting room during your procedure.  Wear loose fitting clothing that is easily removed.  Leave jewelry and other valuables at home.  However, you may wish to bring a book to read or an iPod/MP3 player to listen to music as you wait for your procedure to start.  Remove all body piercing jewelry and leave at home.  Total time from sign-in until discharge is approximately 2-3 hours.  You should go home  directly after your procedure and rest.  You can resume normal activities the day after your procedure.  The day of your procedure you should not:   Drive   Make legal decisions   Operate machinery   Drink alcohol   Return to work  You will receive specific instructions about eating, activities and medications before you leave.    The above instructions have been reviewed and explained to me by   _______________________    I fully understand and can verbalize these instructions _____________________________ Date _________

## 2010-04-21 NOTE — Assessment & Plan Note (Signed)
Summary: migraine/njr   Vital Signs:  Patient Profile:   45 Years Old Female Weight:      216 pounds (98.18 kg) Temp:     98.2 degrees F (36.78 degrees C) oral BP sitting:   120 / 72  (right arm)  Pt. in pain?   yes    Location:   h/a  Vitals Entered By: Arcola Jansky, RN (December 23, 2006 12:17 PM)                  Chief Complaint:  "'migraine yest and today and occular migraines'.  History of Present Illness: Melissa [name] is a 45 year old female, who comes in today for evaluation of increased migraine headaches.  She said take 3 Zomig in the last 24 hours.  She works for work, so we have them because of the wall code via bag for her this, week.  She'mption and she is not sleeping well.  Night.  Her sleep function improved when she took Klonopin .5 1/2 a tablet nightly, which she stopped a week ago.   Current Allergies (reviewed today): No known allergies   Past Medical History:    Allergic rhinitis    Asthma    Headache    Osteoarthritis   Family History:    Reviewed history and no changes required:  Social History:    she works for [employer].  She is very stressed out about the recent bank failure and by out.     Physical Exam  General:     Well-developed,well-nourished,in no acute distress; alert,appropriate and cooperative throughout examination    Impression & Recommendations:  Problem # 1:  HEADACHE (ICD-784.0) Assessment: Deteriorated  The following medications were removed from the medication list:    Nadolol 20 Mg Tabs (Nadolol)  Her updated medication list for this problem includes:    Zomig Zmt 5 Mg Tbdp (Zolmitriptan)   Complete Medication List: 1)  Flovent Hfa 110 Mcg/act Aero (Fluticasone propionate  hfa) .... Inhale 2 puff as directed twice a day 2)  Nasonex 50 Mcg/act Susp (Mometasone furoate) .... Spray 2 spray as directed once a day 3)  Prednisone 20 Mg Tabs (Prednisone) 4)  Proventil Hfa 108 (90 Base) Mcg/act Aers  (Albuterol sulfate) .... Inhale 2 puff as directed four times a day 5)  Topamax 50 Mg Tabs (Topiramate) .... Take 1 tablet by mouth once a day 6)  Zomig Zmt 5 Mg Tbdp (Zolmitriptan) 7)  Protonix 40 Mg Pack (Pantoprazole sodium) .... Two times a day   Patient Instructions: 1)   increase the Topamax 100 mg a day.  Stop drinking caffeine.  Take the Klonopin .5, a half a tablet nightly to help her sleep function.  Return p.r.n.    ]

## 2010-04-21 NOTE — Progress Notes (Signed)
Summary: lab results  Phone Note Call from Patient Call back at Work Phone [phone]   Caller: Patient Call For: Roderick Pee MD Summary of Call: pt would like blood work results also needs copy fax to  bariatric surgery [phone] Initial call taken by: Heron Sabins,  January 01, 2010 9:54 AM  Follow-up for Phone Call        I called patient labs normal Follow-up by: Roderick Pee MD,  January 01, 2010 5:13 PM

## 2010-04-21 NOTE — Assessment & Plan Note (Signed)
Summary: CPX//CCM   Vital Signs:  Patient profile:   45 year old female Menstrual status:  hysterectomy Weight:      226 pounds Temp:     97.9 degrees F oral BP sitting:   110 / 80  (left arm)  Vitals Entered By: Kathrynn Speed CMA (August 26, 2009 10:51 AM) CC: CPX   CC:  CPX.  History of Present Illness: Melissa [name] is a 45 year old, married female, nonsmoker, who comes in today for general physical examination because asthma, migraine headaches, allergic rhinitis restless leg syndrome.  Her asthma is stable on Zyrtec 10 mg daily, Flovent 110, 2 puffs b.i.d., and occasional Proventil.  Her migraine headaches are treated with Zomig 5 mg p.r.n.  she also takes Topamax 50 mg b.i.d., which has helped decrease the frequency and severity of her migraines.  The restless leg syndrome is treated with Mirapex 1.5 mg nightly  She is trying to taper off the circumflex for her chronic back pain.  Since she had her gastric banding.  She's lost 31 pounds in 7 weeks and has less low back pain.  Tetanus booster 2004, seasonal flu 2009, Pneumovax 2010, mammogram, June 2011, normal Pap by GYN.  Normal she does not do BSE monthly    Current Medications (verified): 1)  Proventil Hfa 108 (90 Base) Mcg/act Aers (Albuterol Sulfate) .... Inhale 2 Puff As Directed Four Times A Day 2)  Topamax 50 Mg Tabs (Topiramate) .... Take 1 Tablet By Mouth Two Times A Day 3)  Zomig Zmt 5 Mg Tbdp (Zolmitriptan) .... As Needed 4)  Lyrica 100 Mg  Caps (Pregabalin) .... One Every Other Day 5)  Robaxin 100 Mg/ml Soln (Methocarbamol) .... As Needed 6)  Norco 10-325 Mg Tabs (Hydrocodone-Acetaminophen) .... One and Half Tab At Bedtime 7)  Zyrtec Allergy 10 Mg Tabs (Cetirizine Hcl) .... Once Daily 8)  Daily Multi  Tabs (Multiple Vitamins-Minerals) .... Once Daily 9)  Mirapex 1.5 Mg Tabs (Pramipexole Dihydrochloride) .... 1/2  Tab @ Bedtime 10)  Flovent Hfa 110 Mcg/act Aero (Fluticasone Propionate  Hfa) .... Take 2 Puffs Two  Times A Day 11)  Diflucan 100 Mg  Tabs (Fluconazole) .... Diflucan 100 Mg Two Times A Day X 1 Day, Then Daily X 2 Weeks 12)  Prednisone 20 Mg Tabs (Prednisone) .... Uad 13)  Actigall 300 Mg Caps (Ursodiol) .... Take One Tab By Mouth Two Times A Day 14)  Prilosec Otc 20 Mg Tbec (Omeprazole Magnesium) .... Take One Tab By Mouth Two Times A Day  Allergies (verified): No Known Drug Allergies  Past History:  Past medical, surgical, family and social histories (including risk factors) reviewed, and no changes noted (except as noted below).  Past Medical History: Reviewed history from 12/07/2007 and no changes required. Allergic rhinitis Asthma Headache Osteoarthritis left total knee replacement, October 2008 Depression ADD restless leg syndrome perimenopausal symptoms  Past Surgical History: Reviewed history from 02/20/2009 and no changes required. GALLBLADDER SURGERY 2003 HYSTERECTOMY 2003 TUBAL LIGATION 2002 FOOT SURGERY 1992/2007 Knee Replacement bilateral  Family History: Reviewed history from 02/20/2009 and no changes required. Family History Depression Family History of Breast Cancer: MGM, Mat Aunt x 2 No FH of Colon Cancer: Family History of Uterine Cancer: Sister Family History of Thyroid Cancer: Mother  Social History: Reviewed history from 02/20/2009 and no changes required. she works for [employer] in [employer] [employer].  She is very stressed out about the recent bank failure and by out. Married, 2 boys, 1 girl Patient is a former  smoker.  Alcohol Use - yes 1/month Daily Caffeine Use 2/day Illicit Drug Use - no  Review of Systems      See HPI  Physical Exam  General:  Well-developed,well-nourished,in no acute distress; alert,appropriate and cooperative throughout examination Head:  Normocephalic and atraumatic without obvious abnormalities. No apparent alopecia or balding. Eyes:  No corneal or conjunctival inflammation noted. EOMI. Perrla. Funduscopic exam benign,  without hemorrhages, exudates or papilledema. Vision grossly normal. Ears:  External ear exam shows no significant lesions or deformities.  Otoscopic examination reveals clear canals, tympanic membranes are intact bilaterally without bulging, retraction, inflammation or discharge. Hearing is grossly normal bilaterally. Nose:  External nasal examination shows no deformity or inflammation. Nasal mucosa are pink and moist without lesions or exudates. Mouth:  Oral mucosa and oropharynx without lesions or exudates.  Teeth in good repair. Neck:  No deformities, masses, or tenderness noted. Chest Wall:  No deformities, masses, or tenderness noted. Breasts:  No mass, nodules, thickening, tenderness, bulging, retraction, inflamation, nipple discharge or skin changes noted.   Lungs:  Normal respiratory effort, chest expands symmetrically. Lungs are clear to auscultation, no crackles or wheezes. Heart:  Normal rate and regular rhythm. S1 and S2 normal without gallop, murmur, click, rub or other extra sounds. Abdomen:  Bowel sounds positive,abdomen soft and non-tender without masses, organomegaly or hernias noted. Msk:  No deformity or scoliosis noted of thoracic or lumbar spine.   Pulses:  R and L carotid,radial,femoral,dorsalis pedis and posterior tibial pulses are full and equal bilaterally Extremities:  No clubbing, cyanosis, edema, or deformity noted with normal full range of motion of all joints.   Neurologic:  No cranial nerve deficits noted. Station and gait are normal. Plantar reflexes are down-going bilaterally. DTRs are symmetrical throughout. Sensory, motor and coordinative functions appear intact. Skin:  total body skin exam normal, except a tattoo on her right upper anterior chest wall Cervical Nodes:  No lymphadenopathy noted Axillary Nodes:  No palpable lymphadenopathy Inguinal Nodes:  No significant adenopathy Psych:  Cognition and judgment appear intact. Alert and cooperative with normal  attention span and concentration. No apparent delusions, illusions, hallucinations   Impression & Recommendations:  Problem # 1:  ROUTINE GENERAL MEDICAL EXAM@HEALTH  CARE FACL (ICD-V70.0) Assessment Improved  Problem # 2:  RESTLESS LEG SYNDROME (ICD-333.94) Assessment: Improved  Problem # 3:  OBESITY (ICD-278.00) Assessment: Improved  Problem # 4:  HEADACHE (ICD-784.0) Assessment: Improved  The following medications were removed from the medication list:    Norco 10-325 Mg Tabs (Hydrocodone-acetaminophen) ..... One and half tab at bedtime Her updated medication list for this problem includes:    Zomig Zmt 5 Mg Tbdp (Zolmitriptan) .Marland Kitchen... As needed  Problem # 5:  ASTHMA (ICD-493.90) Assessment: Improved  The following medications were removed from the medication list:    Prednisone 20 Mg Tabs (Prednisone) ..... Uad Her updated medication list for this problem includes:    Proventil Hfa 108 (90 Base) Mcg/act Aers (Albuterol sulfate) ..... Inhale 2 puff as directed four times a day    Flovent Hfa 110 Mcg/act Aero (Fluticasone propionate  hfa) .Marland Kitchen... Take 2 puffs two times a day  Complete Medication List: 1)  Proventil Hfa 108 (90 Base) Mcg/act Aers (Albuterol sulfate) .... Inhale 2 puff as directed four times a day 2)  Zomig Zmt 5 Mg Tbdp (Zolmitriptan) .... As needed 3)  Zyrtec Allergy 10 Mg Tabs (Cetirizine hcl) .... Once daily 4)  Daily Multi Tabs (Multiple vitamins-minerals) .... Once daily 5)  Mirapex 1.5  Mg Tabs (Pramipexole dihydrochloride) .... 1/2  tab @ bedtime 6)  Flovent Hfa 110 Mcg/act Aero (Fluticasone propionate  hfa) .... Take 2 puffs two times a day 7)  Prilosec Otc 20 Mg Tbec (Omeprazole magnesium) .... Take one tab by mouth two times a day 8)  Topamax 100 Mg Tabs (Topiramate) .Marland Kitchen.. 1 tab @ bedtime  Patient Instructions: 1)  lets change the Topamax to 100 mg nightly instead of 50 b.i.d. otherwise, continue all your medications 2)  Please schedule a follow-up  appointment in 1 year. Prescriptions: FLOVENT HFA 110 MCG/ACT AERO (FLUTICASONE PROPIONATE  HFA) take 2 puffs two times a day  #3 x 4   Entered and Authorized by:   Roderick Pee MD   Signed by:   Roderick Pee MD on 08/26/2009   Method used:   Print then Give to Patient   RxID:   [id number] MIRAPEX 1.5 MG TABS (PRAMIPEXOLE DIHYDROCHLORIDE) 1/2  tab @ bedtime  #50 x 3   Entered and Authorized by:   Roderick Pee MD   Signed by:   Roderick Pee MD on 08/26/2009   Method used:   Print then Give to Patient   RxID:   [id number] ZOMIG ZMT 5 MG TBDP (ZOLMITRIPTAN) as needed  #6.0 Each x 2   Entered and Authorized by:   Roderick Pee MD   Signed by:   Roderick Pee MD on 08/26/2009   Method used:   Print then Give to Patient   RxID:   [id number] PROVENTIL HFA 108 (90 BASE) MCG/ACT AERS (ALBUTEROL SULFATE) Inhale 2 puff as directed four times a day  #1 x 1   Entered and Authorized by:   Roderick Pee MD   Signed by:   Roderick Pee MD on 08/26/2009   Method used:   Print then Give to Patient   RxID:   [id number] TOPAMAX 100 MG TABS (TOPIRAMATE) 1 tab @ bedtime  #100 x 3   Entered and Authorized by:   Roderick Pee MD   Signed by:   Roderick Pee MD on 08/26/2009   Method used:   Print then Give to Patient   RxID:   [id number]

## 2010-04-21 NOTE — Procedures (Signed)
Summary: Upper Endoscopy  Patient: Melissa [name] Note: All result statuses are Final unless otherwise noted.  Tests: (1) Upper Endoscopy (EGD)   EGD Upper Endoscopy       DONE     [facility]     [address]     [place], [place]  [address]           ENDOSCOPY PROCEDURE REPORT           PATIENT:  [name], [name]  MR#:  [id number]     BIRTHDATE:  11/01/1965, 43 yrs. old  GENDER:  female           ENDOSCOPIST:  Vania Rea. Jarold Motto, MD, [facility]     Referred by:           PROCEDURE DATE:  04/25/2009     PROCEDURE:  EGD with biopsy, Elease Hashimoto Dilation of Esophagus     ASA CLASS:  Class I     INDICATIONS:  persistent dysphagia despite Rx. for Candida.           MEDICATIONS:   Epinephrine 50 mcg IV, Versed 8 mg IV,     glycopyrrolate (Robinal) 0.2 mg IV     TOPICAL ANESTHETIC:  Exactacain Spray           DESCRIPTION OF PROCEDURE:   After the risks benefits and     alternatives of the procedure were thoroughly explained, informed     consent was obtained.  The LB [id number] [id number] endoscope was     introduced through the mouth and advanced to the second portion of     the duodenum, without limitations.  The instrument was slowly     withdrawn as the mucosa was fully examined.     <<PROCEDUREIMAGES>>           The esophagus and gastroesophageal junction were completely normal     in appearance. WHITE PLAQUES IN PROXIMAL ESOPHAGUS ESOPHAGEAL BX.     #2.BIOPSIES OF DISTAL ESOPHAGUS LABELED #1.a 43f MALONEY DILATOR     PASSED.  Normal duodenal folds were noted.  The stomach was     entered and closely examined. The antrum, angularis, and lesser     curvature were well visualized, including a retroflexed view of     the cardia and fundus. The stomach wall was normally distensable.     The scope passed easily through the pylorus into the duodenum.     Retroflexed views revealed no masses.    The scope was then withdrawn     from the patient and the procedure completed.        COMPLICATIONS:  None           ENDOSCOPIC IMPRESSION:     1) Normal esophagus     2) Normal duodenal folds     3) Normal stomach     4) No masses     PROBABLE PERSISTENT CANDIDA ESOPHAGITIS.CBC IS NORMAL.R/O     EOSINOPHILIC ESOPHAGITIS.     RECOMMENDATIONS:     1) await biopsy results     2) post dilation instructions     STOP DEXILANT RX.DIFLUCAN 100 MG BID FOR 1 DAY, THEN DAILY FOR 2     WEEKS.# 25.           REPEAT EXAM:  No           ______________________________     Vania Rea. Jarold Motto, MD, [facility]           CC:  n.     eSIGNED:   Vania Rea. Patterson at 04/25/2009 01:56 PM           Laurel Dimmer, [id number]  Note: An exclamation mark (!) indicates a result that was not dispersed into the flowsheet. Document Creation Date: 04/25/2009 1:57 PM _______________________________________________________________________  (1) Order result status: Final Collection or observation date-time: 04/25/2009 13:45 Requested date-time:  Receipt date-time:  Reported date-time:  Referring Physician:   Ordering Physician: Sheryn Bison [phone]) Specimen Source:  Source: Launa Grill Order Number: [id number] Lab site:

## 2010-04-21 NOTE — Progress Notes (Signed)
Summary: Needs Dexilant called in to Rx  Phone Note Call from Patient   Call For: Dr Jarold Motto Summary of Call: Got samples of Dexilant-they worked good for her but she is all out. Can we call some in to [facility] on [address]. Initial call taken by: Leanor Kail [facility],  March 25, 2009 12:04 PM    Prescriptions: DEXILANT 60 MG CPDR (DEXLANSOPRAZOLE) 1 by mouth qd  #30 x 6   Entered by:   Ashok Cordia RN   Authorized by:   Mardella Layman MD [facility]   Signed by:   Ashok Cordia RN on 03/25/2009   Method used:   Electronically to        [facility] [address] [facility] [phone]* (retail)       [address]       [place], [place]  [address]       Ph: [phone]       Fax: [phone]   RxID:   [id number]

## 2010-04-21 NOTE — Assessment & Plan Note (Signed)
Summary: leg cramps headache/mhf   Vital Signs:  Patient Profile:   45 Years Old Female Weight:      202 pounds Temp:     98.2 degrees F oral Pulse rate:   68 / minute Pulse rhythm:   regular BP sitting:   124 / 88  (left arm) Cuff size:   regular  Vitals Entered By: Kern Reap CMA (October 26, 2007 12:04 PM)                 Chief Complaint:  leg cramps.  History of Present Illness: Melissa [name] is a 45 year old female, who comes in today for evaluation of two problems.  She's been having pain in both her right and left calves for Pap to 3 months.  Occurs two or 3 times a week, most that time.  It wakes her up in the middle of the night.  She has to get up walk around.  She has no history of trauma.  She has had knee surgery on her left knee.  She also has a problem of the disk in her back.  Both of these conditions are stable.  Number she also has a history of a panic attack.  She's been having spells over the past year to assure get a sensation of fullness in her chest.  A little flushing, and it goes away.  Last week.  She had a spell where she was driving her heart rate went up she develop chest pain.  She had a sense of impending doomed.  The worst of the spell lasted about 10 minutes and slowly dissipated and then she had some funny feelings over about two or 3 days.  She's never had a spell like this in the past.  Cardiopulmonary he is systems negative    Current Allergies: No known allergies    Family History:    Reviewed history from 10/05/2007 and no changes required:       Family History Depression  Social History:    Reviewed history from 12/23/2006 and no changes required:       she works for [employer].  She is very stressed out about the recent bank failure and by out.    Review of Systems      See HPI   Physical Exam  General:     Well-developed,well-nourished,in no acute distress; alert,appropriate and cooperative throughout examination Neck:      No deformities, masses, or tenderness noted. Chest Wall:     No deformities, masses, or tenderness noted. Lungs:     Normal respiratory effort, chest expands symmetrically. Lungs are clear to auscultation, no crackles or wheezes. Heart:     Normal rate and regular rhythm. S1 and S2 normal without gallop, murmur, click, rub or other extra sounds. Msk:     No deformity or scoliosis noted of thoracic or lumbar spine.   Pulses:     R and L carotid,radial,femoral,dorsalis pedis and posterior tibial pulses are full and equal bilaterally Extremities:     No clubbing, cyanosis, edema, or deformity noted with normal full range of motion of all joints.   Neurologic:     No cranial nerve deficits noted. Station and gait are normal. Plantar reflexes are down-going bilaterally. DTRs are symmetrical throughout. Sensory, motor and coordinative functions appear intact.    Impression & Recommendations:  Problem # 1:  PANIC DISORDER (ICD-300.01) Assessment: New  Her updated medication list for this problem includes:    Wellbutrin Sr 100  Mg Xr12h-tab (Bupropion hcl) .Marland Kitchen... Take 1 tablet by mouth every morning    Klonopin 0.5 Mg Tabs (Clonazepam) .Marland Kitchen... 1 by mouth as needed   Problem # 2:  RESTLESS LEG SYNDROME (ICD-333.94) Assessment: New  Complete Medication List: 1)  Flovent Hfa 110 Mcg/act Aero (Fluticasone propionate  hfa) .... Inhale 2 puff as directed twice a day 2)  Nasonex 50 Mcg/act Susp (Mometasone furoate) .... Spray 2 spray as directed once a day 3)  Proventil Hfa 108 (90 Base) Mcg/act Aers (Albuterol sulfate) .... Inhale 2 puff as directed four times a day 4)  Topamax 50 Mg Tabs (Topiramate) .... Take 1 tablet by mouth once a day 5)  Zomig Zmt 5 Mg Tbdp (Zolmitriptan) .... As needed 6)  Protonix 40 Mg Pack (Pantoprazole sodium) .... Two times a day 7)  Celebrex 100 Mg Caps (Celecoxib) .... Take 1 tablet by mouth once a day  not sure of dose 8)  Lyrica 100 Mg Caps (Pregabalin) ....  Take one three times a day 9)  Wellbutrin Sr 100 Mg Xr12h-tab (Bupropion hcl) .... Take 1 tablet by mouth every morning 10)  Adderall Xr 15 Mg Xr24h-cap (Amphetamine-dextroamphetamine) .... Take 1 tablet by mouth every morning 11)  Mirapex 1 Mg Tabs (Pramipexole dihydrochloride) .Marland Kitchen.. 1 tab @ bedtime 12)  Klonopin 0.5 Mg Tabs (Clonazepam) .Marland Kitchen.. 1 by mouth as needed   Patient Instructions: 1)  begin MiraLax.  Then, take a 0.5-mg tablet every night at bedtime for two to 3 months.  Trial. 2)  Also be aware with the panic attacks will need to see what the pattern is.  Keep some Klonopin .5 with you where your gadolinium if you have a spell stopped driving take the Klonopin and relax for 15 to 20 minutes.  We also discussed taking medication on a daily basis.  If the spells become more frequent and more severe and uncontrolled with p.r.n. Klonopin.   Prescriptions: KLONOPIN 0.5 MG  TABS (CLONAZEPAM) 1 by mouth as needed  #30 x 4   Entered and Authorized by:   Roderick Pee MD   Signed by:   Roderick Pee MD on 10/26/2007   Method used:   Print then Give to Patient   RxID:   [id number] MIRAPEX 1 MG  TABS (PRAMIPEXOLE DIHYDROCHLORIDE) 1 tab @ bedtime  #100 x 3   Entered and Authorized by:   Roderick Pee MD   Signed by:   Roderick Pee MD on 10/26/2007   Method used:   Print then Give to Patient   RxID:   [id number]  ]

## 2010-04-21 NOTE — Assessment & Plan Note (Signed)
Summary: sleeping concerns -rv   Vital Signs:  Patient profile:   45 year old female Menstrual status:  hysterectomy Weight:      184 pounds Temp:     97.5 degrees F oral BP sitting:   110 / 72  (left arm) Cuff size:   regular  Vitals Entered By: Kathrynn Speed CMA (November 28, 2009 12:03 PM)  History of Present Illness: Melissa [name] is a 45 year old female, who comes in today for evaluation of sleep dysfunction.  In the spring.  She had a lap band surgery prior to surgery.  She weighed 240, pounds she's now down to 184.  Around the same time.  She had surgery she began having sleep dysfunction.  She describes it worse.  She goes to sleep okay but wakes up at two o'clock in the morning every night and can go back to sleep.  She's had histories of anxiety and depression in the past.  At one point, we sent her to see Dr. Rolly Pancake, Nolen Mu, Dr. Prescilla Sours tried various medications of which would work.  She had very serious sexual dysfunction side effects from all the SSRIs.  Current Medications (verified): 1)  Proventil Hfa 108 (90 Base) Mcg/act Aers (Albuterol Sulfate) .... Inhale 2 Puff As Directed Four Times A Day 2)  Zomig Zmt 5 Mg Tbdp (Zolmitriptan) .... As Needed 3)  Zyrtec Allergy 10 Mg Tabs (Cetirizine Hcl) .... Once Daily 4)  Daily Multi  Tabs (Multiple Vitamins-Minerals) .... Once Daily 5)  Mirapex 1.5 Mg Tabs (Pramipexole Dihydrochloride) .... 1/2  Tab @ Bedtime 6)  Flovent Hfa 110 Mcg/act Aero (Fluticasone Propionate  Hfa) .... Take 2 Puffs Two Times A Day 7)  Prilosec Otc 20 Mg Tbec (Omeprazole Magnesium) .... Take One Tab By Mouth Two Times A Day 8)  Topamax 100 Mg Tabs (Topiramate) .Marland Kitchen.. 1 Tab @ Bedtime 9)  Actigall 300 Mg Caps (Ursodiol) .... Take One Tab By Mouth Two Times A Day  Allergies (verified): No Known Drug Allergies  Review of Systems      See HPI  Physical Exam  General:  Well-developed,well-nourished,in no acute distress; alert,appropriate and cooperative  throughout examination Psych:  Cognition and judgment appear intact. Alert and cooperative with normal attention span and concentration. No apparent delusions, illusions, hallucinations   Impression & Recommendations:  Problem # 1:  SYMPTOM, DYSFUNCTION, SLEEP STAGE (ICD-780.56) Assessment Deteriorated  Orders: Prescription Created Electronically [phone])  Complete Medication List: 1)  Proventil Hfa 108 (90 Base) Mcg/act Aers (Albuterol sulfate) .... Inhale 2 puff as directed four times a day 2)  Zomig Zmt 5 Mg Tbdp (Zolmitriptan) .... As needed 3)  Zyrtec Allergy 10 Mg Tabs (Cetirizine hcl) .... Once daily 4)  Daily Multi Tabs (Multiple vitamins-minerals) .... Once daily 5)  Mirapex 1.5 Mg Tabs (Pramipexole dihydrochloride) .... 1/2  tab @ bedtime 6)  Flovent Hfa 110 Mcg/act Aero (Fluticasone propionate  hfa) .... Take 2 puffs two times a day 7)  Prilosec Otc 20 Mg Tbec (Omeprazole magnesium) .... Take one tab by mouth two times a day 8)  Topamax 100 Mg Tabs (Topiramate) .Marland Kitchen.. 1 tab @ bedtime 9)  Actigall 300 Mg Caps (Ursodiol) .... Take one tab by mouth two times a day 10)  Amitriptyline Hcl 25 Mg Tabs (Amitriptyline hcl) .Marland Kitchen.. 1 tab @ bedtime  Patient Instructions: 1)  begin Elavil 25 mg nightly follow-up in two weeks Prescriptions: AMITRIPTYLINE HCL 25 MG TABS (AMITRIPTYLINE HCL) 1 tab @ bedtime  #30 x 2  Entered and Authorized by:   Roderick Pee MD   Signed by:   Roderick Pee MD on 11/28/2009   Method used:   Electronically to        [facility] # [phone]* (retail)       [address]       [place], [place]  [address]       Ph: [phone]       Fax: [phone]   RxID:   [id number]

## 2010-04-21 NOTE — Miscellaneous (Signed)
Summary: mammogram update  Clinical Lists Changes  Observations: Added new observation of MAMMO DUE: 08/2010 (08/25/2009 16:49) Added new observation of MAMMOGRAM: normal (08/19/2009 16:50)      Preventive Care Screening  Mammogram:    Date:  08/19/2009    Next Due:  08/2010    Results:  normal

## 2010-04-23 NOTE — Progress Notes (Signed)
Summary: Pt has an order to get cbc no diff, by Dr. Benna Dunks  Phone Note Call from Patient Call back at Work Phone [phone]   Caller: Patient Summary of Call: Pt called and said that she is sch to have surgery on Feb 1,2012. Pt has an order to get lab work done by 04/06/10 or later for cbc, no diff icd 9 code of 280.9. Pt would like to come to [facility] to have labs drawn.  Pls advise.  Initial call taken by: Lucy Antigua,  March 26, 2010 2:18 PM  Follow-up for Phone Call        appointment made Follow-up by: Kern Reap CMA Duncan Dull),  March 26, 2010 3:47 PM

## 2010-04-23 NOTE — Assessment & Plan Note (Signed)
Summary: sinus and allergy issues/cjr   Vital Signs:  Patient profile:   45 year old female Menstrual status:  hysterectomy Height:      67 inches Weight:      163 pounds BMI:     25.62 O2 Sat:      100 % on Room air Temp:     97.8 degrees F oral Pulse rate:   70 / minute BP sitting:   100 / 62  (left arm) Cuff size:   regular  Vitals Entered By: Kathlene November LPN (March 02, 2010 4:34 PM)  O2 Flow:  Room air CC: sinus pain, ears popping and feels pressure in them, sneezing, denies cough x 2 weeks   CC:  sinus pain, ears popping and feels pressure in them, sneezing, and denies cough x 2 weeks.  History of Present Illness: Melissa [name] is a 45 year old female, married, nonsmoker, who comes in today for evaluation of allergic rhinitis.  About 10 days ago.  She flew to [facility] for a business meeting while there her allergies flared up.  She had trouble flying home with pain in her ears.  No asthma.  She continues to have a lot of head congestion, postnasal drip, and is going on a cruise this coming Friday  Current Medications (verified): 1)  Proventil Hfa 108 (90 Base) Mcg/act Aers (Albuterol Sulfate) .... Inhale 2 Puff As Directed Four Times A Day 2)  Zomig Zmt 5 Mg Tbdp (Zolmitriptan) .... As Needed 3)  Zyrtec Allergy 10 Mg Tabs (Cetirizine Hcl) .... Once Daily 4)  Daily Multi  Tabs (Multiple Vitamins-Minerals) .... Once Daily 5)  Flovent Hfa 110 Mcg/act Aero (Fluticasone Propionate  Hfa) .... Take 2 Puffs Two Times A Day 6)  Topamax 100 Mg Tabs (Topiramate) .Marland Kitchen.. 1 Tab @ Bedtime 7)  Actigall 300 Mg Caps (Ursodiol) .... Take One Tab By Mouth Two Times A Day 8)  Amitriptyline Hcl 25 Mg Tabs (Amitriptyline Hcl) .Marland Kitchen.. 1 Tab @ Bedtime  Allergies (verified): No Known Drug Allergies  Comments:  Nurse/Medical Assistant: The patient's medications and allergies were reviewed with the patient and were updated in the Medication and Allergy Lists. Kathlene November LPN (March 02, 2010  4:36 PM)  Past History:  Past medical, surgical, family and social histories (including risk factors) reviewed for relevance to current acute and chronic problems.  Past Medical History: Reviewed history from 12/07/2007 and no changes required. Allergic rhinitis Asthma Headache Osteoarthritis left total knee replacement, October 2008 Depression ADD restless leg syndrome perimenopausal symptoms  Past Surgical History: Reviewed history from 02/20/2009 and no changes required. GALLBLADDER SURGERY 2003 HYSTERECTOMY 2003 TUBAL LIGATION 2002 FOOT SURGERY 1992/2007 Knee Replacement bilateral  Family History: Reviewed history from 02/20/2009 and no changes required. Family History Depression Family History of Breast Cancer: MGM, Mat Aunt x 2 No FH of Colon Cancer: Family History of Uterine Cancer: Sister Family History of Thyroid Cancer: Mother  Social History: Reviewed history from 02/20/2009 and no changes required. she works for [employer] in [employer] [place] [employer].  She is very stressed out about the recent bank failure and by out. Married, 2 boys, 1 girl Patient is a former smoker.  Alcohol Use - yes 1/month Daily Caffeine Use 2/day Illicit Drug Use - no  Review of Systems      See HPI  Physical Exam  General:  Well-developed,well-nourished,in no acute distress; alert,appropriate and cooperative throughout examination Head:  Normocephalic and atraumatic without obvious abnormalities. No apparent alopecia or balding. Eyes:  No corneal or  conjunctival inflammation noted. EOMI. Perrla. Funduscopic exam benign, without hemorrhages, exudates or papilledema. Vision grossly normal. Ears:  External ear exam shows no significant lesions or deformities.  Otoscopic examination reveals clear canals, tympanic membranes are intact bilaterally without bulging, retraction, inflammation or discharge. Hearing is grossly normal bilaterally. Nose:  External nasal examination shows no deformity or  inflammation. Nasal mucosa are pink and moist without lesions or exudates. Mouth:  Oral mucosa and oropharynx without lesions or exudates.  Teeth in good repair. Neck:  No deformities, masses, or tenderness noted.   Impression & Recommendations:  Problem # 1:  ALLERGIC RHINITIS (ICD-477.9) Assessment Deteriorated  Her updated medication list for this problem includes:    Zyrtec Allergy 10 Mg Tabs (Cetirizine hcl) ..... Once daily  Complete Medication List: 1)  Proventil Hfa 108 (90 Base) Mcg/act Aers (Albuterol sulfate) .... Inhale 2 puff as directed four times a day 2)  Zomig Zmt 5 Mg Tbdp (Zolmitriptan) .... As needed 3)  Zyrtec Allergy 10 Mg Tabs (Cetirizine hcl) .... Once daily 4)  Daily Multi Tabs (Multiple vitamins-minerals) .... Once daily 5)  Flovent Hfa 110 Mcg/act Aero (Fluticasone propionate  hfa) .... Take 2 puffs two times a day 6)  Topamax 100 Mg Tabs (Topiramate) .Marland Kitchen.. 1 tab @ bedtime 7)  Actigall 300 Mg Caps (Ursodiol) .... Take one tab by mouth two times a day 8)  Amitriptyline Hcl 25 Mg Tabs (Amitriptyline hcl) .Marland Kitchen.. 1 tab @ bedtime 9)  Prednisone 20 Mg Tabs (Prednisone) .... Uad  Patient Instructions: 1)  prednisone 20-mg tablets directions take two now then two every morning for 3 days, one for 3 days, a half for 3 days, then half a tablet every other day for two week taper. 2)  Use the afrin nasal spray, followed by the salt water nasal spray every night at bedtime for the next 5 nights, then stop.  The Afrin, and only use it, when you're on the airplane Prescriptions: PREDNISONE 20 MG TABS (PREDNISONE) UAD  #40 x 0   Entered and Authorized by:   Roderick Pee MD   Signed by:   Roderick Pee MD on 03/02/2010   Method used:   Electronically to        [facility] # [phone]* (retail)       [address]       [place], [place]  [address]       Ph: [phone]       Fax: [phone]   RxID:   [id number]    Orders Added: 1)  Est. Patient Level III  [44010]

## 2010-05-27 ENCOUNTER — Telehealth: Payer: Self-pay | Admitting: Family Medicine

## 2010-05-27 NOTE — Telephone Encounter (Signed)
Triage vm-----wants to order some labs for her bariatric specialist. She stated that she had done this before. Her appt with the specialist is soon. Wants Fleet Contras to return her call.

## 2010-05-28 ENCOUNTER — Other Ambulatory Visit: Payer: Self-pay | Admitting: *Deleted

## 2010-05-28 MED ORDER — PRAMIPEXOLE DIHYDROCHLORIDE 1.5 MG PO TABS: 1.5000 mg | ORAL_TABLET | Freq: Every day | ORAL | Status: DC

## 2010-05-28 NOTE — Telephone Encounter (Signed)
Okay to schedule labs patient request.  Remind her to bring the order with her.

## 2010-05-28 NOTE — Telephone Encounter (Signed)
Pt will come in tomorrow for labs as instructed.

## 2010-05-29 ENCOUNTER — Other Ambulatory Visit: Payer: 59

## 2010-05-29 DIAGNOSIS — E559 Vitamin D deficiency, unspecified: Secondary | ICD-10-CM

## 2010-05-29 DIAGNOSIS — K909 Intestinal malabsorption, unspecified: Secondary | ICD-10-CM

## 2010-05-29 LAB — LIPID PANEL
HDL: 56.9 mg/dL (ref >39.00)
Total CHOL/HDL Ratio: 3
VLDL: 8.6 mg/dL (ref 0.0–40.0)

## 2010-05-29 LAB — COMPREHENSIVE METABOLIC PANEL
ALT: 30 U/L (ref 0–35)
AST: 25 U/L (ref 0–37)
CO2: 24 mEq/L (ref 19–32)
Calcium: 9.1 mg/dL (ref 8.4–10.5)
Chloride: 108 mEq/L (ref 96–112)
GFR: 236.07 mL/min (ref >60.00)
Sodium: 141 mEq/L (ref 135–145)
Total Protein: 6.6 g/dL (ref 6.0–8.3)

## 2010-05-29 LAB — FERRITIN: Ferritin: 55.7 ng/mL (ref 10.0–291.0)

## 2010-05-29 LAB — MAGNESIUM: Magnesium: 2.2 mg/dL (ref 1.5–2.5)

## 2010-05-29 LAB — ALBUMIN: Albumin: 4.3 g/dL (ref 3.5–5.2)

## 2010-05-29 LAB — CALCIUM: Calcium: 9.1 mg/dL (ref 8.4–10.5)

## 2010-05-29 LAB — HEPATIC FUNCTION PANEL
ALT: 30 U/L (ref 0–35)
AST: 25 U/L (ref 0–37)
Bilirubin, Direct: 0.1 mg/dL (ref 0.0–0.3)
Total Bilirubin: 0.6 mg/dL (ref 0.3–1.2)

## 2010-05-30 LAB — CBC
Platelets: 234 10*3/uL (ref 150–400)
RBC: 4.13 MIL/uL (ref 3.87–5.11)
RDW: 13.7 % (ref 11.5–15.5)
WBC: 5.5 10*3/uL (ref 4.0–10.5)

## 2010-05-30 LAB — PREALBUMIN: Prealbumin: 15.8 mg/dL — ABNORMAL LOW (ref 17.0–34.0)

## 2010-05-30 LAB — VITAMIN D 25 HYDROXY (VIT D DEFICIENCY, FRACTURES): Vit D, 25-Hydroxy: 46 ng/mL (ref 30–89)

## 2010-06-01 LAB — PTH-RELATED PEPTIDE

## 2010-06-03 LAB — VITAMIN E: Gamma-Tocopherol (Vit E): 0.3 mg/L (ref <=4.3)

## 2010-06-03 LAB — ZINC: Zinc: 62 ug/dL (ref 60–130)

## 2010-06-04 ENCOUNTER — Telehealth: Payer: Self-pay | Admitting: Family Medicine

## 2010-06-04 LAB — DHEA

## 2010-06-04 NOTE — Telephone Encounter (Signed)
Pt called and is req lab results from 06/02/10 and 05/29/10. Pt says that she would like to be notified with result info and also info needs to be faxed to [facility] fax# [phone].

## 2010-06-05 NOTE — Telephone Encounter (Signed)
Pt called back and said that Dr Adolphus Birchwood, still has not rcvd lab results. Pls resend and notify pt when this has been done.

## 2010-06-05 NOTE — Telephone Encounter (Signed)
Labs faxed to dr dasher

## 2010-06-08 NOTE — Telephone Encounter (Signed)
Labs faxed

## 2010-06-09 ENCOUNTER — Other Ambulatory Visit: Payer: Self-pay | Admitting: *Deleted

## 2010-06-09 ENCOUNTER — Telehealth: Payer: Self-pay | Admitting: *Deleted

## 2010-06-09 MED ORDER — ZOLMITRIPTAN 5 MG PO TABS: 5.0000 mg | ORAL_TABLET | ORAL | Status: DC | PRN

## 2010-06-09 MED ORDER — ZOLMITRIPTAN 5 MG PO TBDP: 5.0000 mg | ORAL_TABLET | ORAL | Status: DC | PRN

## 2010-06-09 NOTE — Telephone Encounter (Signed)
Pharmacy called to ask permission for pt to have Zomig ZMT, it is covered better by her insurance. OK

## 2010-06-10 LAB — PTH-RELATED PEPTIDE

## 2010-07-07 LAB — COMPREHENSIVE METABOLIC PANEL
ALT: 27 U/L (ref 0–35)
Albumin: 3.7 g/dL (ref 3.5–5.2)
Calcium: 9.3 mg/dL (ref 8.4–10.5)
Glucose, Bld: 78 mg/dL (ref 70–99)
Sodium: 140 mEq/L (ref 135–145)
Total Protein: 6.6 g/dL (ref 6.0–8.3)

## 2010-07-07 LAB — BASIC METABOLIC PANEL
BUN: 4 mg/dL — ABNORMAL LOW (ref 6–23)
Chloride: 105 mEq/L (ref 96–112)
GFR calc non Af Amer: >60 mL/min (ref >60)
Potassium: 3.8 mEq/L (ref 3.5–5.1)
Sodium: 135 mEq/L (ref 135–145)

## 2010-07-07 LAB — URINALYSIS, ROUTINE W REFLEX MICROSCOPIC
Glucose, UA: NEGATIVE mg/dL
Ketones, ur: NEGATIVE mg/dL
Nitrite: NEGATIVE
Protein, ur: NEGATIVE mg/dL
pH: 7 (ref 5.0–8.0)

## 2010-07-07 LAB — CBC
HCT: 37.1 % (ref 36.0–46.0)
Hemoglobin: 13.1 g/dL (ref 12.0–15.0)
Hemoglobin: 13.4 g/dL (ref 12.0–15.0)
MCHC: 33.6 g/dL (ref 30.0–36.0)
MCV: 92.4 fL (ref 78.0–100.0)
Platelets: 265 10*3/uL (ref 150–400)
RBC: 4.02 MIL/uL (ref 3.87–5.11)
RDW: 13 % (ref 11.5–15.5)
WBC: 8.9 10*3/uL (ref 4.0–10.5)

## 2010-07-07 LAB — DIFFERENTIAL
Eosinophils Absolute: 0.1 10*3/uL (ref 0.0–0.7)
Lymphs Abs: 1.7 10*3/uL (ref 0.7–4.0)
Monocytes Relative: 9 % (ref 3–12)
Neutro Abs: 3.7 10*3/uL (ref 1.7–7.7)
Neutrophils Relative %: 61 % (ref 43–77)

## 2010-07-07 LAB — APTT: aPTT: 32 seconds (ref 24–37)

## 2010-07-07 LAB — TYPE AND SCREEN: Antibody Screen: NEGATIVE

## 2010-07-07 LAB — URINE CULTURE: Culture: NO GROWTH

## 2010-07-07 LAB — PROTIME-INR: INR: 0.9 (ref 0.00–1.49)

## 2010-08-04 NOTE — Op Note (Signed)
NAMEWELLS, GERDEMAN                 ACCOUNT NO.:  [id number]   MEDICAL RECORD NO.:  [id number]          PATIENT TYPE:  AMB   LOCATION:  [facility]                          FACILITY:  [facility]   PHYSICIAN:  Claude Manges. Whitfield, M.D.DATE OF BIRTH:  [birth year]   DATE OF PROCEDURE:  08/25/2006  DATE OF DISCHARGE:                               OPERATIVE REPORT   PREOPERATIVE DIAGNOSIS:  Tricompartmental degenerative joint disease,  left knee, with synovitis, questionable tear medial meniscus or loose  material, with mechanical symptoms.   POSTOPERATIVE DIAGNOSIS:  Tricompartmental degenerative joint disease,  left knee, with synovitis.   PROCEDURE:  Arthroscopic debridement three compartments, left knee.   SURGEON:  Claude Manges. Cleophas Dunker, M.D.   ANESTHESIA:  IV sedation and local 1% Xylocaine with epinephrine.   COMPLICATIONS:  None.   HISTORY:  45 year old female has a long history of problems referable to  both knees.  She has had prior arthroscopic debridement of her left knee  in 2004 and even more recently in January 2008.  Since that time, she  has continued to have mechanical symptoms of popping, catching, and  clicking, to the point where she is having a fair amount of pain and  discomfort. She was seeking yet another opinion regarding her problem.  She had x-rays that revealed some degenerative changes in all three  compartments with irregular joint surfaces.  She was noted, even in  2004, to have had chondromalacia in three compartments of synovitis.  She wishes to proceed with another arthroscopy to see if there is  something that can be fixed. She has not responded to anti-  inflammatory medicines, time, or exercises.   DESCRIPTION OF PROCEDURE:  With the patient comfortable on the operating  table and under IV sedation, the left lower extremity was prepped with  DuraPrep from the thigh holder to the ankle.  Sterile draping was  performed.  The patient had prior knee arthroscopic  portals. I used one  on the medial aspect of the patellar tendon and one on the other at the  level of the joint line.  These were opened with the 11 blade knife.  The arthroscope was easily placed through the lateral portal.  There was  a clear yellow joint effusion.   Diagnostic arthroscopy revealed mild yet diffuse synovitis in the  superior pouch.  There was a thickened plica with synovitis, so I  established the second portal and inserted the ArthroCare wand, did a  synovectomy, and resected the plica.  There was diffuse chondromalacia  of both the patella as well as the trochlea where there were some loose  areas of articular cartilage.  These were debrided and tapered. Further  synovectomy was performed and I did not see any loose bodies.  I thought  the patella tracked a little bit better with less impingement anteriorly  where there was some osteophytes.   The lateral compartment was then visualized. The lateral meniscus was  intact.  There was diffuse chondromalacia, at least grade 2 changes, of  the tibial plateau and femoral condyle.  There was a lot of  ridging and  softened articular cartilage in areas with near almost complete loss of  articular cartilage.  There was very minimal synovitis.  The  intercondylar notch revealed mild to moderate synovitis.  Synovectomy  was performed with the ArthroCare wand.   The medial compartment was carefully evaluated. To a greater extent,  there was chondromalacia with grade 4 changes diffusely of both the  femoral condyle and tibial plateau.  The meniscus was intact.  Synovectomy was performed.  There were no loose bodies.   The joint was then re-explored with no evidence of loose material.  The  puncture sites were left open and infiltrated with 0.25% Marcaine with  epinephrine.  A sterile bulky dressing was applied followed by an Ace  bandage.   PLAN:  Crutches, Percocet, office in one week.      Claude Manges. Cleophas Dunker, M.D.   Electronically Signed     PWW/MEDQ  D:  08/25/2006  T:  08/25/2006  Job:  [id number]

## 2010-08-04 NOTE — Assessment & Plan Note (Signed)
[facility]                         GASTROENTEROLOGY OFFICE NOTE   NAME:Nicholl, [facility]                      MRN:          [id number]  DATE:12/05/2006                            DOB:          05-28-1965    REFERRING PHYSICIAN:  Tinnie Gens A. Tawanna Cooler, M.D.   PROBLEM:  Acid reflux.   HISTORY:  Melissa [name] is a pleasant 45 year old white female generally in  good health.  She does have a history of migraine headaches, asthma, and  osteoarthritis.  She is status post cholecystectomy in 2003, had  hysterectomy in 2004, and a tubal ligation in 2002.  The patient says  that she has had acid reflux symptoms for years which she has self-  treated with Zantac on a daily basis generally twice daily over at least  the past three years.  Prior to that time she would take it as needed.  Her symptoms got to the point where they were not controlled with Zantac  a few months and she started taking over-the-counter Prilosec.  She did  this for two weeks, but did not continue it because of the package  labeling.  She had been in to see Dr. Tawanna Cooler recently and had complained  of her reflux symptoms and he told her to go back on the Prilosec 20 mg  once daily.  This helped but again she continued to have breakthrough  symptoms and started taking the medicine twice daily, about four days  ago.  Again this seems to be helping, but has not completely alleviated  her symptoms at this time.  She says she has classic heartburn,  indigestion symptoms, and has been bothered by a persistent sore throat  and hoarseness which does not seem to go away.  This had been treated as  allergies at one point without any relief and is still present now.  She  has started having some mild solid food dysphagia, no difficulty with  liquids.  She has not had any episodes of regurgitation.  She generally  does not have any nocturnal symptoms, awakening her from sleep.  She  denies any abdominal pain.   The patient says that one of her problems is that she generally does eat  late at night and then lays down. She has been trying to be NPO for  three hours before bedtime just over this past week.  She is not  drinking any regular alcohol, does not take any regular aspirin or  NSAIDS.  She does use caffeine, but generally not after noon each day.   CURRENT MEDICATIONS:  1. Glucosamine once daily.  2. Claritin once daily.  3. Flovent 110 mcg two puffs daily.  4. Prilosec 20 mg b.i.d.  5. Zyrtec 10 mg q.h.s.  6. Topamax 50 mg daily.  7. Klonopin 0.5 mg q.h.s.   ALLERGIES:  No known drug allergies.   PAST MEDICAL HISTORY:  As outlined above.  In addition she has had  several arthroscopic surgeries on her knees and two previous foot  surgeries.   FAMILY HISTORY:  Pertinent for breast cancer in a maternal grandmother  and  an aunt, thyroid cancer in her mother.  There is no family history  of colon cancer or polyps that she is aware of.   SOCIAL HISTORY:  She is married and has three children.  She is employed  in [employer].  She is a smoker having quit in 1997.  She  rarely drinks any alcohol.   REVIEW OF SYSTEMS:  GASTROINTESTINAL:  As outlined above.  She does have  fairly chronic allergy sinus symptoms.  Arthritic symptoms and otherwise  complete review of systems is negative.   PHYSICAL EXAMINATION:  GENERAL:  A well-developed white female in no  acute distress.  VITAL SIGNS:  Height is 5 feet 7 inches, weight is 213, blood pressure  106/70, pulse 72.  HEENT:  Normocephalic and atraumatic.  EOMI.  PERRLA.  Sclerae  anicteric.  NECK:  Supple without nodes.  CARDIOVASCULAR:  Regular rate and rhythm with S1 and S2.  No murmurs,  rubs, or gallops.  LUNGS:  Clear to auscultation and percussion.  ABDOMEN:  Soft.  Bowel sounds are active.  She is nontender.  There is  no palpable mass or hepatosplenomegaly.  RECTAL:  Not done at this time.   IMPRESSION:  45 year old  white female with chronic gastroesophageal  reflux disease, hoarseness and intermittent solid food dysphagia.  I  suspect her current symptoms are secondary to poorly controlled acid  reflux and possible peptic stricture.   PLAN:  1. Schedule upper endoscopy with possible dilation.  2. Institute antireflux regimen.  3. Trial of Protonix 40 mg one p.o. b.i.d. for now with plans to scale      back to once daily once her symptoms are well controlled.      Mike Gip, PA-C  Electronically Signed      Iva Boop, MD,FACG  Electronically Signed   AE/MedQ  DD: 12/05/2006  DT: 12/05/2006  Job #: [id number]

## 2010-08-04 NOTE — Op Note (Signed)
[name], [name]                 ACCOUNT NO.:  [id number]   MEDICAL RECORD NO.:  [id number]          PATIENT TYPE:  INP   LOCATION:  2899                         FACILITY:  [facility]   PHYSICIAN:  Claude Manges. Whitfield, M.D.DATE OF BIRTH:  09/07/1965   DATE OF PROCEDURE:  04/30/2008  DATE OF DISCHARGE:                               OPERATIVE REPORT   PREOPERATIVE DIAGNOSIS:  End-stage osteoarthritis, right knee.   POSTOPERATIVE DIAGNOSIS:  End-stage osteoarthritis, right knee.   PROCEDURE:  Right total knee replacement with computer navigation.   SURGEON:  Claude Manges. Cleophas Dunker, MD   ASSISTANT:  Karie Chimera, PA-C   ANESTHESIA:  General with supplemental femoral nerve block.   COMPLICATIONS:  None.   COMPONENTS:  DePuy LCS standard plus femoral component #4 keeled tibial  tray, a 15-mm polyethylene bridging bearing, a 3-pegged metal-backed  rotating patella, all was secured with polymethyl methacrylate.   PROCEDURE:  The patient was comfortable on operating table and under  general orotracheal anesthesia, nursing staff inserted a Foley catheter.  Urine was clear.  Tourniquet was then applied to the right lower  extremity, which have been appropriately marked as the operative  extremity preoperatively.  The right lower extremity was then prepped  with Betadine scrub and DuraPrep from the thigh holder to the midfoot  with Betadine scrub and then DuraPrep, sterile draping was performed.  With the extremity still elevated, it was Esmarch exsanguinated with the  proximal tourniquet at 350 mmHg.   A midline longitudinal incision was made centered about the patella  extending from the superior part of the tibial tubercle via sharp  dissection.  Incision carried down to subcutaneous tissue.  First layer  of capsule was incised in the midline.  A medial parapatellar incision  was then made with a Bovie.  The joint was entered.  There was a clear  yellow joint effusion.   The patella was  everted to 180 degrees and the knee flexed to 90  degrees.  There was moderate amount of synovitis.  Synovectomy was  performed.  There were large osteophytes along the medial and lateral  femoral condyle and almost complete loss of articular cartilage along  the medial femoral condyle to a great extent laterally.  Osteophytes  were removed and then we evaluated the size of the components.  We  trialed a standard plus femoral component, which we had templated  preoperatively and felt this was the best size, subsequent computer  determination also confirmed the standard plus.   Computer navigation was employed.  Two Schanz pins were then placed in  the distal femur and two in the proximal tibia, and the computer arrays  were applied.  The leg was placed in full range of motion to determine  the normal anatomical weightbearing axis and focal points to determine  size of the components.  Based on the computer navigation points, first  cut was made transversely along the proximal tibia with the 7-degree  angle of declination.  At each of bony cut, we confirmed our cuts with  the computer, flexion and extension gaps were then  determined after the  initial tibial cut.  Subsequent cuts were then made on the femur based  on the computer determinations.  Each cut, we were then at 1 to 1-1/2  degrees of  normal anatomic axis.  The lamina spreader was then inserted  in the medial lateral compartments, medial and lateral menisci were  removed as well as anterior and posterior cruciate ligaments.  There was  a Baker cyst in the posterior and the medial compartment and this was  debrided.  Final femoral cut was then made to taper the edges, a 15-mm  flexion and extension gap were symmetrical.   Retractor was then placed about the tibia, #4 tibial tray was applied  with a perfect fit, central hole was then made followed by the keel cut.  The pins removed, the 15-mm bridging bearing was applied followed  by the  femur.  Through a full range of motion, we had full extension and our  varus valgus was symmetrical with her gaps within a half of millimeter.   The patella was then prepared by removing 10 mm of bone, leaving 13 mm  of patellar thickness.  Three holes were then made, the trial patella  was applied through a full range of motion.  There was no subluxation.   The trial components were then removed.  The joint was copiously  irrigated with saline solution.  The final components were applied with  polymethyl methacrylate.  We initially inserted the tibia and removed  extraneous methacrylate from around its periphery.  The 15-mm bridging  bearing was then applied followed by the standard plus femoral  component.  Again all extraneous methacrylate was removed.  The knee was  placed in extension.  The patella was applied with methacrylate and  patella clamp.  While waiting for the methacrylate to mature, we  injected Marcaine with epinephrine along the deep capsule and inserted  FloSeal for hemostasis.  After complete maturation of methacrylate, the  tourniquet was deflated, any gross bleeders of bovie were coagulated.  We actually had a very nice dry field.  We lightly irrigated the joint.  The deep capsule was then closed with interrupted #1 Ethibond,  superficial capsule closed with the running 0 Vicryl subcu, and several  layers 0 and 2-0 Vicryl skin closed with skin clips.  The computer  arrays and Schanz pins were removed prior to wound closure.   Sterile bulky dressing was applied followed by the patient's compressive  stocking.  The patient tolerated the procedure without complications.      Claude Manges. Cleophas Dunker, M.D.  Electronically Signed     PWW/MEDQ  D:  04/30/2008  T:  04/30/2008  Job:  [id number]

## 2010-08-04 NOTE — Op Note (Signed)
Melissa [name], Melissa [name]                 ACCOUNT NO.:  [id number]   MEDICAL RECORD NO.:  [id number]          PATIENT TYPE:  INP   LOCATION:  5005                         FACILITY:  [facility]   PHYSICIAN:  Claude Manges. Whitfield, M.D.DATE OF BIRTH:  November 21, 1965   DATE OF PROCEDURE:  01/03/1007  DATE OF DISCHARGE:                               OPERATIVE REPORT   PREOPERATIVE DIAGNOSIS:  End-stage osteoarthritis of left knee.   POSTOPERATIVE DIAGNOSIS:  End-stage osteoarthritis of left knee.   PROCEDURE:  Left total knee replacement with computer navigation.   SURGEON:  Claude Manges. Cleophas Dunker, M.D.   ASSISTANT:  Arlys John D. Petrarca, P.A.-C.   ANESTHESIA:  General with supplemental femoral nerve block.   COMPLICATIONS:  None.   COMPONENTS:  DePuy LCS standard plus femoral component, a #4 rotating  keeled tibial tray with a 10-mm bridging bearing, a metal-backed 3-peg  rotating patella; all were secured with polymethylmethacrylate.   PROCEDURE:  With the patient comfortable on the operating table and  under general orotracheal anesthesia, the nursing staff inserted a Foley  catheter.  The tourniquet was applied to the left thigh.  The left leg  was then prepped from the tourniquet to the midfoot with Betadine scrub  and then DuraPrep.  Sterile draping was performed.  With the extremity  still elevated, it was Esmarch-exsanguinated with a proximal tourniquet  at 350 mmHg.   A midline longitudinal incision was made centered at the patella,  extending from the tibial tubercle to the superior pouch.  Via sharp  dissection, the incision was carried down to the subcutaneous tissue.  The first layer of capsule was incised in the midline.  A medial  parapatellar incision was made with the Bovie through the deep capsule.  The joint was then entered with a clear yellow joint effusion.   The patella was everted 180 degrees and the knee flexed to 90 degrees.  There was nearly complete absence of articular  cartilage in the medial  femoral condyle and through a great extent on the lateral femoral  condyle were large osteophytes medially and laterally and osteophytes,  particularly along the medial tibial tray.  There was considerable  chondromalacia of the patella and a moderate amount of synovitis; a  synovectomy was performed.   We elected to use computer navigation.  Two Schanz pins were placed in  the proximal tibia and 2 in the distal femur.  The computer arrays were  then applied.  Morphed points were then determined from the computer  including the normal anatomic axis.  Based on the computer points,  initial cuts were made transversely in the proximal tibia at about a 6-  degree posterior declination.  At that point, we established  flexion/extension gaps and felt that they were symmetrical.   Subsequent cuts were then made on the femur based on the computer  points.  When we were determining the distal femoral cut, we thought the  computer was in error and therefore we free-handed the final cuts on the  femur including the final oblique cuts.   The computer still had noted that  we were in perfect anatomic alignment  with varus and valgus, but based on the computer, we would have taken  too much of the distal femur, so then we proceeded to make the proximal  tibial cuts.  A center cut was made after applying the #4 tray and the  keeled cuts were made.  The 10-mm bridging bearing was inserted as our  flexion/extension gaps were symmetrical at 10 and then the trial  standard plus femur.  We had full extension based on the computer.  We  had an opening with varus and valgus stress and we now had maintained  normal anatomic alignment.   The patella was then prepared by removing 10 mm of bone, leaving 13 mm  of patella thickness.  The three holes were then made with a trial and  the trial patella applied and through a full range of motion, there was  no subluxation.   The ACL and  PCL had been sacrificed after the femoral cuts.  The MCL and  LCL remained intact.  We also removed any remnants of medial and lateral  menisci.  Osteophytes were removed from the posterior femoral condyles  prior to applying the trial femoral component.   The trial components were removed.  The joint was copiously irrigated  with jet saline.  Final components were then applied with  polymethylmethacrylate with the leg in extension.  Any extraneous  methacrylate was removed with a Glorious Peach.   The joint was then reinspected without any events of extraneous  methacrylate.  The capsule was injected with Marcaine with epinephrine.  Tourniquet was deflated; we had a nice dry field.  Any the small  bleeders were Bovie-coagulated.  The pin holes in the bone were covered  with wax.  There was notching of the anterior femur.  This area was  tapered and then the bone wax was applied to bleeding bone.   A Hemovac was not necessary.   The deep capsule was closed with interrupted #1 Ethibond, superficial  capsule closed with a running 0 Vicryl, subcu with 2-0 Vicryl, and skin  closed with skin clips.  A sterile bulky dressing was applied followed  by the patient's compression stockings.   There were no anesthetic complications.  The patient returned to the  postanesthesia recovery room in satisfactory condition.      Claude Manges. Cleophas Dunker, M.D.  Electronically Signed     PWW/MEDQ  D:  01/03/2007  T:  01/04/2007  Job:  [id number]

## 2010-08-07 NOTE — Op Note (Signed)
Melissa [name], Melissa [name]                         ACCOUNT NO.:  [id number]   MEDICAL RECORD NO.:  [id number]                   PATIENT TYPE:  OBV   LOCATION:  9310                                 FACILITY:  [facility]   PHYSICIAN:  Duke Salvia. [name], M.D.            DATE OF BIRTH:  1965-06-21   DATE OF PROCEDURE:  05/07/2003  DATE OF DISCHARGE:                                 OPERATIVE REPORT   PREOPERATIVE DIAGNOSIS:  Abnormal uterine bleeding.   POSTOPERATIVE DIAGNOSES:  1. Abnormal uterine bleeding.  2. Left dermoid cyst.   PROCEDURE:  Laparoscopic supracervical hysterectomy, left salpingo-  oophorectomy.   SURGEON:  Duke Salvia. [name], M.D.   ASSISTANT:  Guy Sandifer. Henderson Cloud, M.D.   ANESTHESIA:  General endotracheal anesthesia.   COMPLICATIONS:  None.   DRAINS:  Foley catheter.   ESTIMATED BLOOD LOSS:  150 mL.   SPECIMENS:  Left tube and ovary.  Supracervical hysterectomy (fundus of  uterus).   DESCRIPTION OF PROCEDURE:  The patient taken to the operating room.  After  an adequate level of general endotracheal anesthesia was obtained, the  patient's legs in stirrups, the abdomen, perineum and vagina prepped and  draped in usual manner for laparoscopy.  The bladder was drained.  UA  carried out.  Uterus was mid position, normal size.  Adnexa unremarkable.  Attention directed to the abdomen where a 2 cm subumbilical incision was  made after infiltrating with 0.5% Marcaine plain.  Veress needle was  introduced without difficulty.  A 3 liter pneumoperitoneum was then created.  Laparoscopic trocar and sleeve were then introduced without difficulty.  Initial inspection revealed no evidence of any bleeding or trauma.  Two  lower ports were placed just above and medial to the iliac crest, lateral to  the rectus muscles after negative transillumination and negative testing  with a 20-gauge spinal needle.  These were both _________ trocars 10 mm  inserted under direct vision.   Initial  inspection revealed the uterus to be normal size.  There was some  scarring from her prior C-section that was mild to moderate.  Right ovary  unremarkable.  The cul-de-sac was free and clear.  The left ovary was  enlarged with a smooth wall 5 cm cyst.  The harmonic scalpel was then used  initially to coagulate and cut the utero-ovarian pedicles down to and  including the round ligament.  Same instrument was then used to dissect the  anterior peritoneum across the bladder flap.  This was dissected for a short  way down with sharp and blunt dissection.  The harmonic scalpel was then  used on either side to the lower ports in coagulation mode to coagulate the  ascending branch of the uterine artery.  When this was accomplished,  supracervical hysterectomy was accomplished then by removing the fundus of  the uterus with the harmonic scalpel.  This was done hemostatically.  The  specimen was removed.  The powered morcellator was then inserted through the  left lower port under direct vision.  The specimen was morcellated without  difficulty.  The pelvis was then irrigated with saline and aspirated.  The  cervical stump canal was coagulated on lower power with the harmonic scalpel  to destroy any remaining endocervical tissue.  This area was completely  hemostatic.  Decision was made to proceed with right ovarian cystotomy.  On  initial small cystotomy, some oily material was noted.  This was suctioned  to deflate the cyst without any significant spill.  Once this was  accomplished, the Endobag was brought in and the cyst was placed in the  Endobag.  The cyst was opened and deflated.  Oily material and hair was  noted.  This was accomplished by performing LSO after identifying the ureter  well below.  The left IP ligament was isolated, coagulated and cut with good  hemostasis.  Once this was completely deflated within the Endobag.  The left  lower port incision was increased at the fascia.  The  morcellator was  brought back into place and the body of the ovary was morcellated out  directly over the Endobag so as not to spill any liquid content.  Once the  tissue was removed, the Endobag was collapsed and slowly worked out through  the incision without spill.  The pelvis was then irrigated copiously and  aspirated.  The pressure was lowered.  All operative sites were hemostatic.  Prior to closure, sponge, needle and instrument counts were reported as  correct x2.   Instruments are removed.  Gas allowed to escape.  The fascial defects closed  with 2-0 Dexon interrupted sutures at both lower ports, could not reach the  fascia from the umbilical port.  Skin there was closed with 4-0 Vicryl  Rapide suture with Dermabond on the lower incisions and Steri-Strips.  She  tolerated this well.  Clear urine noted at the end of the case.                                               Richard M. [name], M.D.    RMH/MEDQ  D:  05/07/2003  T:  05/07/2003  Job:  [id number]

## 2010-08-07 NOTE — Op Note (Signed)
[facility]  Patient:    Melissa [name], Melissa [name] Visit Number: [id number] MRN: [id number]          Service Type: DSU Location: [facility] Attending Physician:  Rhina Brackett Dictated by:   Duke Salvia. [name], M.D. Proc. Date: 07/14/01 Admit Date:  07/14/2001                             Operative Report  PREOPERATIVE DIAGNOSES:       Menorrhagia.  POSTOPERATIVE DIAGNOSES:      Menorrhagia.  PROCEDURE:                    Cryo ablation.  ANESTHESIA:                   Paracervical block.  PROCEDURE AND FINDINGS:       Patient went to the operating room.  Legs placed in stirrups.  Betadine prepped and draped.  Bladder was drained.  EUA carried out.  Uterus was normal size, mid to slightly posterior.  Adnexa negative. Speculum was positioned.  Cervix grasped with a tenaculum.  Paracervical block created by infiltrating 1% Xylocaine 5-7 cc at 3 and 9 oclock submucosally after negative aspiration.  The uterus was then sounded to 8 cm.  It was progressively dilated to a 29 Pratt.  Small blunt curette was then used to perform a D&C primarily to perform endometrial biopsy.  Small amount of tissue was sent to pathology.  After this was completed the cryo probe was inserted to the right cornu and six minute freeze cycle was carried out.  This was thawed.  Cryo probe repositioned into the left cornu and a six minute freeze cycle was performed.  The cryogen probe was then removed.  She tolerated this extremely well.  Went to recovery room in good condition. Dictated by:   Duke Salvia. [name], M.D. Attending Physician:  Rhina Brackett DD:  07/14/01 TD:  07/14/01 Job: [id number]

## 2010-08-07 NOTE — Op Note (Signed)
Melissa [name], Melissa [name]                         ACCOUNT NO.:  [id number]   MEDICAL RECORD NO.:  [id number]                   PATIENT TYPE:  AMB   LOCATION:  DSC                                  FACILITY:  [facility]   PHYSICIAN:  Robert A. Thurston Hole, M.D.              DATE OF BIRTH:  Nov 08, 1965   DATE OF PROCEDURE:  DATE OF DISCHARGE:                                 OPERATIVE REPORT   PREOPERATIVE DIAGNOSIS:  Left knee chondromalacia, degenerative joint  disease and synovitis.   POSTOPERATIVE DIAGNOSIS:  Left knee chondromalacia, degenerative joint  disease and synovitis.   PROCEDURE:  1. Left knee exam under anesthesia followed by arthroscopic chondroplasty.  2. Left knee partial synovectomy.   SURGEON:  Elana Alm. Thurston Hole, M.D.   ANESTHESIA:  Local and monitored anesthesia care.   OPERATIVE TIME:  30 minutes.   COMPLICATIONS:  None.   INDICATIONS FOR PROCEDURE:  The patient is a 45 year old woman who has had  painful chondromalacia and synovitis in  her left knee longstanding over the  last 1 to 2 years, increasing in nature with signs and symptoms and MRI  documenting chondromalacia and synovitis. She has failed conservative care  and is not to  undergo arthroscopy.   DESCRIPTION OF PROCEDURE:  The patient was brought to the operating room Jul 23, 2002, after a knee block had been in the holding area. She was placed on  the operating table in the supine position. Her left knee was examined under  anesthesia. Her range of motion from 0 to 120 degrees, 1 to 2+ crepitation.  The knee was stable. The ligamentous examination with normal patellar  tracking.   The left leg was prepped using sterile Duraprep and draped using sterile  technique. Originally through an inferolateral portal the arthroscope with a  pump attachment was placed  into an inferomedial portal and an arthroscope  probe was placed.   On initial inspection of  the medial compartment she was found to have 50%  grade 3 chondromalacia with loose articular cartilage pieces and this was  thoroughly debrided. The medial meniscus was intact. The intracondylar notch  was inspected and the anterior and posterior cruciate ligaments were normal.  The lateral compartment showed 30% to 40% grade 3 chondromalacia. This was  thoroughly debrided. The lateral  meniscus was intact.   The patellofemoral joint showed 75% to 80% grade 3 chondromalacia which was  thoroughly debrided and 50% changes on the femoral groove and this was  debrided. The patella tracked normally. Moderate synovitis in the medial  and  lateral gutters was debrided and loose articular cartilage pieces in  the medial and lateral gutters were debrided. Otherwise they were free of  pathology.   After this was done it was felt that all pathology had been satisfactorily  addressed. The instruments were removed. The portals were closed with 3-0  nylon suture and injected with 0.25%  Marcaine with epinephrine and 4 mg of  morphine. Sterile dressings were applied.   The patient was awakened and taken to the recovery room in stable condition.  She is to be followed as an outpatient on Vicodin and Naprosyn. She will  be  back in the office in one week for sutures out and followup.                                               Robert A. Thurston Hole, M.D.    RAW/MEDQ  D:  07/23/2002  T:  07/23/2002  Job:  [id number]

## 2010-08-07 NOTE — H&P (Signed)
[facility]  PatientSHALEENA, [name] Visit Number: [id number] MRN: [id number]          Service Type: Attending:  Duke Salvia. [name], M.D. Dictated by:   Duke Salvia. [name], M.D. Adm. Date:  07/14/01                           History and Physical  CHIEF COMPLAINT:              Menorrhagia.  HISTORY OF PRESENT ILLNESS:   A 45 year old patient with a prior tubal who has had continued menorrhagia and fatigue.  Sonohysterogram done in our office ______ showed normal findings.  We tried OCPs without significant improvement.  After discussing the options she prefers to proceed with cryoablation.  This procedure including expected results of hypomenorrhea and amenorrhea along with other risks regarding bleeding, infection, complications that may require additional surgery all reviewed with her, which she understands.  PAST MEDICAL HISTORY:  ALLERGIES:                    None.  OPERATIONS:                   D&E x2.  She has had one vaginal delivery, one C section at the time of her tubal ligation.  Also prior foot surgery and laparoscopy in the past.  REVIEW OF SYSTEMS:            Significant for a history of depression.  PHYSICAL EXAMINATION:  VITAL SIGNS:                  Temperature 98.2, blood pressure 120/84.  HEENT:                        Unremarkable.  NECK:                         Supple without masses.  LUNGS:                        Clear.  CARDIOVASCULAR:               Regular rate and rhythm without murmurs, rubs, or gallops noted.  BREASTS:                      Without masses.  ABDOMEN:                      Soft, flat, nontender.  PELVIC:                       Normal external genitalia, vagina and cervix clear.  Uterus midposition, normal size.  Adnexa negative.  Last Pap was August 2002 which was class 1.  EXTREMITIES AND NEUROLOGIC:   Unremarkable.  IMPRESSION:                   Continued symptomatic menorrhagia.  PLAN:                          Cryoablation.  Procedure and risks discussed as above. Dictated by:   Duke Salvia. [name], M.D. Attending:  Duke Salvia. [name], M.D. DD:  07/05/01 TD:  07/05/01 Job: [id number]

## 2010-08-07 NOTE — Discharge Summary (Signed)
NAMEKAILEY, Melissa [name]                         ACCOUNT NO.:  [id number]   MEDICAL RECORD NO.:  [id number]                   PATIENT TYPE:  OBV   LOCATION:  9310                                 FACILITY:  [facility]   PHYSICIAN:  Duke Salvia. [name], M.D.            DATE OF BIRTH:  13-Jun-1965   DATE OF ADMISSION:  05/07/2003  DATE OF DISCHARGE:  05/08/2003                                 DISCHARGE SUMMARY   DISCHARGE DIAGNOSES:  1. Abnormal uterine bleeding.  2. Left ovarian dermoid cyst.  3. Laparoscopic supracervical hysterectomy, left salpingo-oophorectomy.  4. Drainage of left dermoid cysts.   For summary of the history and physical examination, please see admission  history for details.  Briefly, a 45 year old G4, P2, paratubal, who is two  years post D&C hysteroscopy and cryoablation.  She initially had a good  response with decreased menstrual flow but has developed increasing  menorrhagia and presents now for [facility].   HOSPITAL COURSE:  On May 07, 2003, under general anesthesia, the  patient underwent LSH, a 5 cm left ovarian cyst, smooth walled, was noted at  the time.  At the end of the procedure when this was drained, it was noted  to be a dermoid.  Therefore LSO was carried.  It was removed in an Endobag  intact.   The first postoperative day, the catheter had been removed the evening  before.  She was ambulating without difficulty, had established normal bowel  and urinary function and was ready for discharge.  She was febrile with  preoperative hemoglobin 13.1, postoperative 10.8.   LABORATORY DATA:  Admission CBC with wbc 8.0, hemoglobin 13.1.  UA was  negative.  Postoperative CBC on May 08, 2003, with wbc 11.9, hemoglobin  10.8, hematocrit 31.0.  Pathology report is still pending.   DISPOSITION:  The patient discharged on Tylox p.r.n. pain.  She will return  to the office in one week. Advised to report any redness or drainage from  the three abdominal incisions,  any urinary complaints, nausea, vomiting, or  increased vaginal bleeding.  She was advised no heavy exercise or sex for  one month.  She was given specific instructions regarding diet, sex and  exercise.   CONDITION ON DISCHARGE:  Good.   ACTIVITY:  Gradually increase.                                               Richard M. [name], M.D.    RMH/MEDQ  D:  05/08/2003  T:  05/08/2003  Job:  [id number]

## 2010-08-07 NOTE — Op Note (Signed)
NAMEMELYSSA, Melissa [name]                         ACCOUNT NO.:  [id number]   MEDICAL RECORD NO.:  [id number]                   PATIENT TYPE:  AMB   LOCATION:  DSC                                  FACILITY:  [facility]   PHYSICIAN:  Robert A. Thurston Hole, M.D.              DATE OF BIRTH:  10/04/1965   DATE OF PROCEDURE:  09/03/2002  DATE OF DISCHARGE:                                 OPERATIVE REPORT   PREOPERATIVE DIAGNOSIS:  Right knee chondromalacia with loose bodies and  synovitis.   POSTOPERATIVE DIAGNOSIS:  Right knee chondromalacia with loose bodies and  synovitis.   PROCEDURE:  Right knee examination under anesthesia followed by arthroscopic  chondroplasty with partial synovectomy and loose body excisions.   SURGEON:  Elana Alm. Thurston Hole, M.D.   ASSISTANT:  Julien Girt, P.A.   ANESTHESIA:  Local and MAC.   OPERATIVE TIME:  30 minutes.   COMPLICATIONS:  None.   INDICATIONS FOR PROCEDURE:  [name] is a 45 year old woman who has had  significant right knee pain for the past six months increasing in nature  with signs and symptoms consistent with chondromalacia, loose bodies and  synovitis who has failed conservative care and is now to undergo  arthroscopy.   DESCRIPTION OF PROCEDURE:  [name] was brought to the operating room on  September 03, 2002, after knee blocks had been placed in the holding room.  She  was placed on the operating table in supine position.  Her right knee was  examined under anesthesia.  She had range of motion from 0 to 125 degrees, 1  to 2+ crepitation, knee stable ligamentous examination with normal patella  tracking. The right leg was prepped using sterile DuraPrep and draped using  sterile technique.  Originally, through an inferior lateral portal, the  arthroscope with a pump attachment was placed and through an inferior medial  portal, an arthroscopic probe was placed.  On initial inspection of the  medial compartment, she was found to have 50%  grade III chondromalacia which  was thoroughly debrided.  Medial meniscus was intact.  Intercondylar notch  inspected.  Anterior and posterior cruciate ligaments were normal.  Lateral  compartment inspected.  She had 50% grade III changes in the lateral tibial  plateau which was debrided, 30 to 40% grade III changes lateral femoral  condyle which was debrided, loose articular cartilage pieces were also  removed from the lateral compartment.  Patellofemoral joint showed 50% grade  III chondromalacia on the patella and the femoral groove and this was  thoroughly debrided.  The patella tracked normally.  Moderate synovitis in  the medial and lateral gutters were debrided, otherwise they were free of  pathology.  After this was done, it was felt that all pathology had been  satisfactorily addressed.  The lateral meniscus had also been probed and it  was found to be normal.  At this point, the arthroscopic  instruments were  removed.  Portals were closed with 3-0 nylon suture and injected with 0.25%  Marcaine with epinephrine and 4 mg of morphine.  Sterile dressings applied.  The patient was awakened and taken to the recovery room in stable condition.    FOLLOW UP CARE:  [name] will be followed as an outpatient on Vicodin and  Naprosyn.  See her back in the office in a week for sutures out and follow-  up.                                               Robert A. Thurston Hole, M.D.    RAW/MEDQ  D:  09/03/2002  T:  09/03/2002  Job:  [id number]

## 2010-08-07 NOTE — Discharge Summary (Signed)
[facility]  Patient:    GAZELLE, TOWE                        MRN: [id number] Adm. Date:  [id number] Disc. Date: [id number] Attending:  Trevor Iha Dictator:   Danie Chandler, R.N.                           Discharge Summary  ADMITTING DIAGNOSIS:          Intrauterine pregnancy at 38-1/2 weeks                               estimated gestational age for induction of                               labor.  DISCHARGE DIAGNOSES:          1. Intrauterine pregnancy at 38-1/2 weeks                                  estimated gestational age for induction of                                  labor.                               2. Transverse lie.  PROCEDURE ON October 04, 2000:   1. Primary low transverse cesarean section.                               2. Bilateral tubal ligation.  REASON FOR ADMISSION:          The patient is a 45 year old married white female, gravida 4, para 1, who was admitted for a two-stage induction of labor. The estimated fetal weight one week ago on ultrasound was approximately 9 pounds. The patient was admitted on the evening of October 03, 2000 and underwent Cytotec induction. Pitocin was started on the morning of October 04, 2000. Cervical examination was closed, thick, and very high. Ultrasound revealed a transverse lie, back up, with the head in the left upper quadrant and the breech in the right lower quadrant. Options were discussed and cesarean section was recommended.  HOSPITAL COURSE:              The patient was taken to the operating room and underwent the above-named procedure without complication. This was productive of a viable female infant with Apgars of 9 at one minute, and 9 at five minutes, and an arterial cord pH of 7.32. Postoperatively on day #1, the patient had good return of bowel function and good control of pain. Her hemoglobin on this day was 10.4, hematocrit 29.6, and white blood cell count 9.6.  On postoperative day #2 she was without complaint. She was ambulating well without difficulty and tolerating a regular diet. She was discharged home on postoperative day #3.  CONDITION ON DISCHARGE:       Good.  DIET:  Regular as tolerated.  ACTIVITY:                     No heavy lifting, no driving, no vaginal entry.  DISCHARGE FOLLOWUP:           She is to follow up in the office in one to two weeks for incision check and she is to call for temperature greater than 100 degrees, persistent nausea or vomiting, heavy vaginal bleeding, and/or redness or drainage from the incision site.  DISCHARGE MEDICATIONS:        1. Prenatal vitamin one p.o. q.d.                               2. Tylox one to two p.o. q.4h. p.r.n. pain. D:  10/24/00 TD:  10/24/00 Job: [id number]

## 2010-08-07 NOTE — Op Note (Signed)
[facility]  Patient:    Melissa [name], Melissa [name]                        MRN: [id number] Proc. Date: 10/04/00 Adm. Date:  [id number] Attending:  Trevor Iha                           Operative Report  PREOPERATIVE DIAGNOSES:       1. Intrauterine pregnancy at 38-1/2 weeks                                  estimated gestational age.                               2. Transverse lie.  POSTOPERATIVE DIAGNOSIS:      1. Intrauterine pregnancy at 38-1/2 weeks                                  estimated gestational age.                               2. Transverse lie.  OPERATION:                    1. Low transverse cesarean section.                               2. Bilateral tubal ligation.  SURGEON:                      Guy Sandifer. Arleta Creek, M.D.  ASSISTANT:  ANESTHESIA:                   Combined spinal epidural.  ANESTHESIOLOGIST:             Ellison Hughs., M.D.  ESTIMATED BLOOD LOSS:         900 cc.  FINDINGS:                     A viable female infant with Apgars of 9 and 9 at one and five minutes, respectively. Birth weight pending.  Arterial cord pH 7.32.  INDICATIONS AND CONSENT:      The patient is a 45 year old married white female G4, P1, AB2 admitted for a two-stage induction of labor.  Estimated fetal weight one week ago on ultrasound was 9 pounds.  The patient was admitted last evening and underwent Cytotec induction.  Pitocin was started this a.m.  Cervical examination was closed, thick, and very high.  Ultrasound revealed a transverse lie, back up, with the head in the left upper quadrant and the breech in the right lower quadrant. Options were discussed and cesarean section was recommended.  Possible risks and complications were discussed with the patient and her husband including, but not limited to, infection, organ damage, bleeding requiring transfusion of blood products with possible transfusion reaction, HIV and hepatitis  acquisition, DVT, PE, pneumonia.  The patient also requests tubal ligation.  Permanence of the procedure, failure rate, and increased ectopic risks were discussed.  All questions were answered and consent was  signed on the chart.  DESCRIPTION OF PROCEDURE:     The patient is taken to the operating room where a combined spinal epidural anesthetic is administered.  She is then placed in the dorsal supine position with 15 degree left lateral wedge.  She is prepped. Foley catheter is placed in the bladder as a drain. She is draped in a sterile fashion.  After testing for adequate anesthesia, skin is entered through a Pfannenstiel incision and dissection is carried out in layers to the peritoneum.  The peritoneum is sharply entered and extended superiorly and inferiorly. The vesicouterine peritoneum is taken down cephalolaterally and the bladder flap is developed and bladder blade is placed.  Uterus is incised in a low transverse manner and the uterine cavity is entered bluntly with a Kelly clamp.  Artificial rupture of membranes for clear fluid is done.  The infant is then delivered as a double footling breech without difficulty.  Oro- and nasopharynx were suctioned and good cry and tone is noted.  Cord is clamped and cut and the infant is handed to the awaiting pediatrics team. Placenta is manually delivered.  The internal uterine contour normal is normal and is clean.  The uterus is closed in running locked fashion with 0 Monocryl suture.  A single figure-of-eight is placed in the center of the incision to obtain hemostasis.  Ovaries are normal bilaterally.  The left fallopian tube was identified from cornea to fimbria, grasped in its mid ampullary portion with a Babcock clamp and the intervening knuckle of tube is doubly ligated with 0 plain free ties.  The intervening knuckle of tube is then resected and good hemostasis is noted. The similar procedure is carried out on the right side.  A  small amount of cautery is used on the right tube to obtain hemostasis.  Reinspection reveals good hemostasis all around and irrigation is carried out.  The anterior peritoneum is closed in running fashion with 0 Monocryl suture which was also used to reapproximated the pyramidalis muscle in the midline.  The anterior rectus fascia is closed in running fashion with 0 Panacryl suture.  Subcuticular tissue was reapproximated with interrupted 0 plain sutures.  Skin is closed with clips. All sponge, needle and instrument counts were correct.  The patient is transferred to the recovery room in stable condition. DD:  10/04/00 TD:  10/04/00 Job: [id number]

## 2010-08-07 NOTE — Op Note (Signed)
Melissa [name], Melissa [name]                           ACCOUNT NO.:  [id number]   MEDICAL RECORD NO.:  [id number]                   PATIENT TYPE:  INP   LOCATION:  5712                                 FACILITY:  [facility]   PHYSICIAN:  Angelia Mould. Derrell Lolling, M.D.             DATE OF BIRTH:  07-Jun-1965   DATE OF PROCEDURE:  12/01/2001  DATE OF DISCHARGE:                                 OPERATIVE REPORT   PREOPERATIVE DIAGNOSIS:  Gallstones and biliary colic.   POSTOPERATIVE DIAGNOSIS:  Acute cholecystitis with cholelithiasis.   OPERATION PERFORMED:  Laparoscopic cholecystectomy with intraoperative  cholangiogram.   SURGEON:  Angelia Mould. Derrell Lolling, M.D.   FIRST ASSISTANT:  Douglas [name]. Magnus Ivan, M.D.   OPERATIVE INDICATION:  This is [name] 45 year old white female who was admitted  to the hospital late last night with [name] 12-hour history of epigastric pain  and nausea.  This lasted several hours.  Dr. Barbee Shropshire admitted her.  [name]  gallbladder ultrasound showed gallstones and one stone in the gallbladder  neck/cystic duct junction.  Liver function tests were normal.  White count  was 10,700.  This morning she felt better and had just minimal abdominal  tenderness.  She is brought to the operating room for cholecystectomy.   OPERATIVE FINDINGS:  The gallbladder was acutely inflamed.  There was  significant edema of the gallbladder and the perigallbladder tissues.  There  was [name] stone impacted in the cystic duct close to the infundibulum of the  gallbladder which we milked back into the gallbladder.  The cholangiogram  was normal.  Cholangiogram showed normal intrahepatic and extrahepatic bile  ducts, prompt flow of contrast into the duodenum, and no filling defects.  Otherwise the liver, stomach, duodenum, colon, and small intestine all  looked normal.   OPERATIVE TECHNIQUE:  Following the induction of general endotracheal  anesthesia, the patient's abdomen was prepped and draped in [name] sterile  fashion.   Marcaine, 0.5%, with epinephrine was used as local infiltration  anesthetic.  [name] vertically oriented incision was made at the lower end of the  umbilicus.  The fascia was incised in the midline and the abdominal cavity  entered under direct vision.  [name] 10-mm Hasson trocar was inserted and secured  with [name] pursestring suture of 0 Vicryl.  Pneumoperitoneum was created.  [name]  video camera was inserted with visualization and findings as described  above.  [name] 10-mm trocar was placed in the subxiphoid region and two 5-mm  trocars were placed in the right upper quadrant.  We identified the fundus  of the gallbladder.  It was edematous but we could get [name] good grasp of it.  [name] lot of adhesions were present.  We took these down slowly and carefully  and had good visualization throughout.  We dissected out the infundibulum of  the gallbladder and the cystic duct proximally.  There was one stone in the  cystic duct  which we milked back into the gallbladder.  We isolated the  cystic artery, secured it with metal clips and divided it.  We created [name]  large window behind the cystic duct.  The cystic duct was clipped with [name]  metal clip close to the gallbladder.  [name] cholangiogram catheter was inserted  into the cystic duct.  [name] cholangiogram was obtained with the C-arm.  We  found that the cystic duct was quite long and tortuous.  Intrahepatic and  extrahepatic biliary anatomy was normal, there were no filling defects, and  the common bile duct drained into the duodenum well.   The cholangiogram catheter was removed and the cystic duct was secured with  multiple metal clips and divided.  The gallbladder was dissected from its  bed with electrocautery and removed.  There was moderate bleeding in the  gallbladder bed which was controlled nicely with electrocautery and one  small piece of Surgicel gauze.  At the completion of the case there was no  bleeding and no bile leak whatsoever.  We irrigated the subhepatic space  and  right pericolic gutter and subphrenic space and, at the end of the case the  irrigation fluid was clear.  We checked all the trocar sites and we removed  all the trocars under direct vision.  There was no bleeding coming from the  trocar sites.  The pneumoperitoneum was released.  The fascia of the  umbilicus was closed with 0 Vicryl suture.  Skin incisions were closed with  subcuticular sutures of 4-0 Vicryl and Steri-Strips.  Kling bandages were  placed and the patient was taken to the recovery room in stable condition.  Estimated blood loss was about 30 cc.  Complications none.  The sponge and  instrument counts were correct.                                               Angelia Mould. Derrell Lolling, M.D.    HMI/MEDQ  D:  12/01/2001  T:  12/02/2001  Job:  [id number]   cc:   Olene Craven, M.D.

## 2010-08-07 NOTE — H&P (Signed)
Melissa [name], Melissa [name]                         ACCOUNT NO.:  [id number]   MEDICAL RECORD NO.:  [id number]                   PATIENT TYPE:  OBV   LOCATION:  NA                                   FACILITY:  [facility]   PHYSICIAN:  Duke Salvia. [name], M.D.            DATE OF BIRTH:  1965/09/24   DATE OF ADMISSION:  DATE OF DISCHARGE:                                HISTORY & PHYSICAL   DATE OF SCHEDULED SURGERY:  May 07, 2003   CHIEF COMPLAINT:  Menorrhagia.   HISTORY OF PRESENT ILLNESS:  A 45 year old G4 P2; the patient has had a  prior tubal.  This patient almost 2 years ago, April 2003, underwent D&C  hysteroscopy and cryoablation, had some initial improvement in her bleeding  pattern, but now continues to have heavy, irregular bleeding, and presents  now for [facility].  She did not want to consider other options such as Mirena or  OCPs.  The procedure of LSH was reviewed to her in detail.  The procedure  including risk of bleeding, infection, adjacent organ injury, the possible  need for open or additional surgery reviewed.  The fact that she would need  to continue with yearly Pap smears discussed with her.  The possible  benefits of LSH including maintenance of pelvic support and improved  recovery time reviewed with her also.  Her cytology over the years has been  normal.  Last Pap dated October 2004 was normal.   Recently she had an E2 of 56 with an FSH of 3.2.   PAST MEDICAL HISTORY:  1. Allergies:  None.  2. Operations:  D&E x2.  One vaginal delivery, one cesarean section at the     time of tubal ligation.  She has also had prior foot surgery and     laparoscopy in the past.   CURRENT MEDICATIONS:  Zyrtec, Flonase, Flovent, Naprosyn p.r.n., and Sudafed  p.r.n.   REVIEW OF SYSTEMS:  Otherwise significant for a prior history of depression  not currently on medications.   PHYSICAL EXAMINATION:  VITAL SIGNS:  Temperature 98.2, blood pressure  120/84.  HEENT:  Unremarkable.  NECK:  Supple without masses.  LUNGS:  Clear.  CARDIOVASCULAR:  Regular rate and rhythm without murmurs, rubs, or gallops  noted.  BREASTS:  Without masses.  ABDOMEN:  Soft, flat, and nontender.  PELVIC:  Normal external genitalia, vagina and cervix clear.  Uterus  midposition, normal size.  Adnexa negative.  EXTREMITIES AND NEUROLOGIC:  Unremarkable.   IMPRESSION:  Abnormal bleeding, failed conservative management with  cryoablation.   PLAN:  LSH.  Procedure and risks discussed as above.                                               Richard M. [name], M.D.  [facility]/MEDQ  D:  04/25/2003  T:  04/25/2003  Job:  [id number]

## 2010-09-08 ENCOUNTER — Ambulatory Visit (INDEPENDENT_AMBULATORY_CARE_PROVIDER_SITE_OTHER): Payer: 59 | Admitting: Licensed Clinical Social Worker

## 2010-09-08 DIAGNOSIS — F4323 Adjustment disorder with mixed anxiety and depressed mood: Secondary | ICD-10-CM

## 2010-09-22 ENCOUNTER — Telehealth: Payer: Self-pay | Admitting: *Deleted

## 2010-09-22 NOTE — Telephone Encounter (Signed)
Appt made for pt to see Dr. Tawanna Cooler at her convenience for increasing leg cramps and twitching.

## 2010-09-25 ENCOUNTER — Other Ambulatory Visit: Payer: Self-pay | Admitting: Family Medicine

## 2010-10-01 ENCOUNTER — Encounter: Payer: Self-pay | Admitting: Family Medicine

## 2010-10-05 ENCOUNTER — Ambulatory Visit: Payer: 59 | Admitting: Family Medicine

## 2010-10-06 ENCOUNTER — Other Ambulatory Visit: Payer: Self-pay | Admitting: *Deleted

## 2010-10-06 MED ORDER — PRAMIPEXOLE DIHYDROCHLORIDE 1 MG PO TABS: 1.0000 mg | ORAL_TABLET | Freq: Two times a day (BID) | ORAL | Status: DC

## 2010-11-26 ENCOUNTER — Other Ambulatory Visit: Payer: Self-pay | Admitting: *Deleted

## 2010-11-26 MED ORDER — ALBUTEROL SULFATE HFA 108 (90 BASE) MCG/ACT IN AERS: 2.0000 | INHALATION_SPRAY | Freq: Four times a day (QID) | RESPIRATORY_TRACT | Status: DC | PRN

## 2010-12-30 LAB — BASIC METABOLIC PANEL
CO2: 22
Chloride: 107
Chloride: 107
Creatinine, Ser: 0.54
GFR calc Af Amer: >60
GFR calc non Af Amer: >60
Glucose, Bld: 127 — ABNORMAL HIGH
Potassium: 3.6
Potassium: 3.7
Sodium: 137
Sodium: 140

## 2010-12-30 LAB — CBC
HCT: 30.7 — ABNORMAL LOW
HCT: 34.7 — ABNORMAL LOW
Hemoglobin: 10.7 — ABNORMAL LOW
Hemoglobin: 11.9 — ABNORMAL LOW
MCHC: 34.9
MCV: 89.1
MCV: 90.3
RBC: 3.44 — ABNORMAL LOW
RDW: 12.7
WBC: 7.7

## 2010-12-30 LAB — PROTIME-INR: Prothrombin Time: 14

## 2010-12-31 LAB — COMPREHENSIVE METABOLIC PANEL
Albumin: 4.1
Alkaline Phosphatase: 43
BUN: 9
GFR calc Af Amer: >60
Potassium: 3.3 — ABNORMAL LOW
Sodium: 139
Total Protein: 7.2

## 2010-12-31 LAB — URINE CULTURE: Colony Count: NO GROWTH

## 2010-12-31 LAB — CROSSMATCH
ABO/RH(D): O POS
Antibody Screen: NEGATIVE

## 2010-12-31 LAB — URINALYSIS, ROUTINE W REFLEX MICROSCOPIC
Bilirubin Urine: NEGATIVE
Nitrite: NEGATIVE
Specific Gravity, Urine: 1.01
pH: 6.5

## 2010-12-31 LAB — CBC
HCT: 39.6
MCHC: 34
MCV: 91.6
Platelets: 283
RDW: 12.9
WBC: 6.2

## 2010-12-31 LAB — DIFFERENTIAL
Basophils Relative: 1
Monocytes Absolute: 0.3
Monocytes Relative: 6
Neutro Abs: 3.9

## 2010-12-31 LAB — APTT: aPTT: 29

## 2011-01-06 ENCOUNTER — Other Ambulatory Visit: Payer: Self-pay | Admitting: Family Medicine

## 2011-02-09 ENCOUNTER — Other Ambulatory Visit: Payer: Self-pay | Admitting: Family Medicine

## 2011-03-03 MED ORDER — PREDNISONE 20 MG PO TABS: 20.0000 mg | ORAL_TABLET | Freq: Every day | ORAL | Status: DC

## 2011-03-03 NOTE — Telephone Encounter (Signed)
rx sent to pharmacy

## 2011-03-03 NOTE — Telephone Encounter (Signed)
Please refill Prednisone for her allergies, to [facility]. Thanks.

## 2011-04-15 ENCOUNTER — Telehealth: Payer: Self-pay | Admitting: *Deleted

## 2011-04-15 NOTE — Telephone Encounter (Signed)
Pt is asking for RX to help her sleep.  Has been having trouble increasing over the past year.

## 2011-04-15 NOTE — Telephone Encounter (Signed)
Spoke with patient and appointment made

## 2011-04-15 NOTE — Telephone Encounter (Signed)
ov

## 2011-04-19 ENCOUNTER — Encounter: Payer: Self-pay | Admitting: Family Medicine

## 2011-04-19 ENCOUNTER — Ambulatory Visit (INDEPENDENT_AMBULATORY_CARE_PROVIDER_SITE_OTHER): Payer: 59 | Admitting: Family Medicine

## 2011-04-19 VITALS — BP 98/62 | Temp 97.7°F | Ht 67.0 in | Wt 136.0 lb

## 2011-04-19 DIAGNOSIS — G4720 Circadian rhythm sleep disorder, unspecified type: Secondary | ICD-10-CM

## 2011-04-19 MED ORDER — CLONAZEPAM 0.5 MG PO TABS: ORAL_TABLET | ORAL | Status: DC

## 2011-04-19 NOTE — Progress Notes (Signed)
  Subjective:    Patient ID: Melissa [name], female    DOB: [birth year], 46 y.o.   MRN: [id number]  HPI Melissa [name] it is a 46 year old, married female, nonsmoker........... Status post two years.  This spring with gastric bypass surgery........... Initial weight 250+ now 136........... Who comes in today for evaluation of insomnia.  She states every since her operation.  She said sleep dysfunction.  Sometimes should go to bed can't go to sleep sometimes wake up and come back to sleep.  It one time retried low-dose Elavil, but it did work.  She is also intolerant of SSRIs.  They cause a negative sexual function.  She stopped all caffeine is a nonsmoker and does not drink any alcohol.  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   Review of Systems    General psychiatric review of systems otherwise negative Objective:   Physical Exam  Well-developed well-nourished, female, in no acute distress.  BP right arm sitting position 100/70.  Cardiac exam normal.  Limited skin exam back, chest, abdomen, arms, and face normal      Assessment & Plan:  Sleep dysfunction.  Plan begin clonidine, .5 nightly follow-up in two weeks

## 2011-04-19 NOTE — Patient Instructions (Signed)
Begin the C\klonopin, one tablet daily at bedtime.  Return in two weeks for follow-up if

## 2011-05-03 ENCOUNTER — Ambulatory Visit: Payer: 59 | Admitting: Family Medicine

## 2011-05-04 ENCOUNTER — Ambulatory Visit: Payer: 59 | Admitting: Family Medicine

## 2011-05-06 ENCOUNTER — Ambulatory Visit (INDEPENDENT_AMBULATORY_CARE_PROVIDER_SITE_OTHER): Payer: 59 | Admitting: Family Medicine

## 2011-05-06 ENCOUNTER — Encounter: Payer: Self-pay | Admitting: Family Medicine

## 2011-05-06 DIAGNOSIS — G4720 Circadian rhythm sleep disorder, unspecified type: Secondary | ICD-10-CM

## 2011-05-06 NOTE — Progress Notes (Signed)
  Subjective:    Patient ID: Melissa [name], female    DOB: September 05, 1965, 46 y.o.   MRN: [id number]  HPI [name] is a 46 year old married female nonsmoker who comes in today for reevaluation of insomnia  See the details in her previous note 2 weeks ago. We started her on Klonopin 0.5 each bedtime and now she is sleeping well and feels much better. No side effects from the medication. BP today 100/70 general and neurologic and cardiovascular review of systems otherwise negative   Review of Systems     Objective:   Physical Exam  Well-developed thin female weight down and steady at 138. Cardiopulmonary exam normal      Assessment & Plan:  Insomnia continue Klonopin 0.5 each bedtime for 3 months and then taper

## 2011-05-06 NOTE — Patient Instructions (Signed)
Continue the Klonopin nightly at bedtime for 2 months then decrease the Klonopin to one tablet Monday Wednesday Friday for one month then stop

## 2011-05-16 ENCOUNTER — Other Ambulatory Visit: Payer: Self-pay | Admitting: Family Medicine

## 2011-05-19 ENCOUNTER — Other Ambulatory Visit: Payer: Self-pay | Admitting: *Deleted

## 2011-05-19 MED ORDER — ZOLMITRIPTAN 5 MG PO TBDP: 5.0000 mg | ORAL_TABLET | ORAL | Status: DC | PRN

## 2011-05-27 ENCOUNTER — Other Ambulatory Visit: Payer: Self-pay | Admitting: Family Medicine

## 2011-05-27 MED ORDER — HYDROCODONE-ACETAMINOPHEN 7.5-750 MG PO TABS: 1.0000 | ORAL_TABLET | Freq: Three times a day (TID) | ORAL | Status: AC | PRN

## 2011-05-27 MED ORDER — TIZANIDINE HCL 4 MG PO TABS: 4.0000 mg | ORAL_TABLET | Freq: Four times a day (QID) | ORAL | Status: AC | PRN

## 2011-07-01 ENCOUNTER — Telehealth: Payer: Self-pay | Admitting: *Deleted

## 2011-07-01 DIAGNOSIS — G4700 Insomnia, unspecified: Secondary | ICD-10-CM

## 2011-07-01 DIAGNOSIS — G2581 Restless legs syndrome: Secondary | ICD-10-CM

## 2011-07-01 NOTE — Telephone Encounter (Signed)
I think it's time for a neurology consult,,,,,,,,,, Dr. Denton Meek to evaluate the restless leg syndrome and sleep dysfunction please refer her and call her a letter no or doing

## 2011-07-01 NOTE — Telephone Encounter (Signed)
Referral request sent and patient is aware 

## 2011-07-01 NOTE — Telephone Encounter (Addendum)
Klonipin .5mg . Is not working for pt to sleep anymore.  She wakes up at 2-3 am.  Husband states her restless leg is still extremely bad, and the Mirapex does not seem to be helping.

## 2011-07-06 ENCOUNTER — Encounter: Payer: Self-pay | Admitting: Neurology

## 2011-07-09 ENCOUNTER — Other Ambulatory Visit (INDEPENDENT_AMBULATORY_CARE_PROVIDER_SITE_OTHER): Payer: 59

## 2011-07-09 DIAGNOSIS — K909 Intestinal malabsorption, unspecified: Secondary | ICD-10-CM

## 2011-07-09 LAB — LIPID PANEL
Cholesterol: 141 mg/dL (ref 0–200)
HDL: 60.5 mg/dL (ref >39.00)
LDL Cholesterol: 72 mg/dL (ref 0–99)
VLDL: 8.2 mg/dL (ref 0.0–40.0)

## 2011-07-09 LAB — COMPREHENSIVE METABOLIC PANEL
ALT: 56 U/L — ABNORMAL HIGH (ref 0–35)
AST: 49 U/L — ABNORMAL HIGH (ref 0–37)
CO2: 23 mEq/L (ref 19–32)
Chloride: 110 mEq/L (ref 96–112)
GFR: 118.28 mL/min (ref >60.00)
Sodium: 141 mEq/L (ref 135–145)
Total Bilirubin: 0.5 mg/dL (ref 0.3–1.2)
Total Protein: 6.9 g/dL (ref 6.0–8.3)

## 2011-07-09 LAB — CBC WITH DIFFERENTIAL/PLATELET
Basophils Absolute: 0 10*3/uL (ref 0.0–0.1)
Eosinophils Absolute: 0.1 10*3/uL (ref 0.0–0.7)
Lymphocytes Relative: 39.6 % (ref 12.0–46.0)
Monocytes Relative: 6 % (ref 3.0–12.0)
Platelets: 227 10*3/uL (ref 150.0–400.0)
RDW: 15.1 % — ABNORMAL HIGH (ref 11.5–14.6)

## 2011-07-10 LAB — VITAMIN D 25 HYDROXY (VIT D DEFICIENCY, FRACTURES): Vit D, 25-Hydroxy: 48 ng/mL (ref 30–89)

## 2011-07-12 ENCOUNTER — Telehealth: Payer: Self-pay | Admitting: Family Medicine

## 2011-07-12 LAB — PTH, INTACT AND CALCIUM: Calcium, Total (PTH): 9.2 mg/dL (ref 8.4–10.5)

## 2011-07-12 NOTE — Telephone Encounter (Signed)
Pt called req to get lab results. Pls call.  °

## 2011-07-13 LAB — VITAMIN E
Gamma-Tocopherol (Vit E): 0.3 mg/L (ref <=4.3)
Vitamin E (Alpha Tocopherol): 8.3 mg/L (ref 5.7–19.9)

## 2011-07-13 LAB — VITAMIN A: Vitamin A (Retinoic Acid): 29 ug/dL — ABNORMAL LOW (ref 38–98)

## 2011-07-13 NOTE — Telephone Encounter (Signed)
Patient is aware of lab results, copy faxed, copy mailed

## 2011-07-14 LAB — VITAMIN B6: Vitamin B6: 49.1 ng/mL — ABNORMAL HIGH (ref 2.1–21.7)

## 2011-07-20 ENCOUNTER — Telehealth: Payer: Self-pay | Admitting: Family Medicine

## 2011-07-20 ENCOUNTER — Encounter: Payer: Self-pay | Admitting: Family Medicine

## 2011-07-20 NOTE — Telephone Encounter (Signed)
Severe stomach pain. Please assist with appt.

## 2011-07-20 NOTE — Telephone Encounter (Signed)
Ate 5 grapes this morning and had acute abdominal pain.  She went to the ER but starting feeling better.  She is complaining of a headache and some lower abdominal discomfort. No nausea, vomiting, diarrhea, or fever.    Per Dr Tawanna Cooler okay to wait for specialist on Thursday.  She may have a food intolerance and should avoid grapes for now.

## 2011-08-09 ENCOUNTER — Telehealth: Payer: Self-pay

## 2011-08-09 NOTE — Telephone Encounter (Signed)
Spoke with patient.

## 2011-08-09 NOTE — Telephone Encounter (Signed)
Pt states she is experiencing bilateral swelling in her legs and ankles.  Pt states this is something new and it started on yesterday.  Pt states the last couple of times she has come in to see Dr. Tawanna Cooler her bp has been low and he has checked her for swelling but none was found.  Pt would like to know what Dr. Tawanna Cooler suggest.  Pls advise.

## 2011-08-09 NOTE — Telephone Encounter (Signed)
Fleet Contras please call,,,,,,,,, have her stay on a salt free diet for 2 weeks if after that period of time the swelling persists then we need to see her,,

## 2011-09-01 ENCOUNTER — Ambulatory Visit (INDEPENDENT_AMBULATORY_CARE_PROVIDER_SITE_OTHER): Payer: 59 | Admitting: Licensed Clinical Social Worker

## 2011-09-01 DIAGNOSIS — F4323 Adjustment disorder with mixed anxiety and depressed mood: Secondary | ICD-10-CM

## 2011-09-07 ENCOUNTER — Ambulatory Visit: Payer: 59 | Admitting: Neurology

## 2011-10-08 ENCOUNTER — Ambulatory Visit (INDEPENDENT_AMBULATORY_CARE_PROVIDER_SITE_OTHER): Payer: 59 | Admitting: Licensed Clinical Social Worker

## 2011-10-08 ENCOUNTER — Other Ambulatory Visit: Payer: Self-pay | Admitting: *Deleted

## 2011-10-08 DIAGNOSIS — F4323 Adjustment disorder with mixed anxiety and depressed mood: Secondary | ICD-10-CM

## 2011-10-08 MED ORDER — PRAMIPEXOLE DIHYDROCHLORIDE 1 MG PO TABS: 1.0000 mg | ORAL_TABLET | Freq: Two times a day (BID) | ORAL | Status: DC

## 2011-10-15 ENCOUNTER — Ambulatory Visit: Payer: 59 | Admitting: Licensed Clinical Social Worker

## 2011-10-22 ENCOUNTER — Ambulatory Visit: Payer: 59 | Admitting: Licensed Clinical Social Worker

## 2011-11-22 ENCOUNTER — Other Ambulatory Visit: Payer: Self-pay | Admitting: Family Medicine

## 2011-12-21 ENCOUNTER — Other Ambulatory Visit: Payer: Self-pay | Admitting: *Deleted

## 2011-12-21 MED ORDER — ZOLMITRIPTAN 5 MG PO TBDP: 5.0000 mg | ORAL_TABLET | ORAL | Status: DC | PRN

## 2012-01-11 ENCOUNTER — Telehealth: Payer: Self-pay | Admitting: Family Medicine

## 2012-01-11 NOTE — Telephone Encounter (Signed)
Please work in at 11:30 tomorrow

## 2012-01-11 NOTE — Telephone Encounter (Signed)
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

## 2012-01-11 NOTE — Telephone Encounter (Signed)
Caller: C[place]/Patient; Patient Name: Melissa [name]; PCP: Kelle Darting [facility]); Best Callback Phone Number: [phone] Onset-few months. Pt states that frequency of migraines has decreased but when she has them they last longer. Current headache started on  Sun.01/09/12 and it is still lingering. She rates the pain at a 3 on pain scale. She is using prescribed medications for migraine. Emergent s/s of Headache protocol  r/o. Pt to see provider within 24hrs. No appointments for Dr. Tawanna Cooler seen, message sent.

## 2012-01-12 ENCOUNTER — Encounter: Payer: Self-pay | Admitting: Family Medicine

## 2012-01-12 ENCOUNTER — Ambulatory Visit (INDEPENDENT_AMBULATORY_CARE_PROVIDER_SITE_OTHER): Payer: 59 | Admitting: Family Medicine

## 2012-01-12 VITALS — BP 110/70 | Temp 98.3°F | Wt 154.0 lb

## 2012-01-12 DIAGNOSIS — R51 Headache: Secondary | ICD-10-CM

## 2012-01-12 DIAGNOSIS — G4720 Circadian rhythm sleep disorder, unspecified type: Secondary | ICD-10-CM

## 2012-01-12 DIAGNOSIS — D649 Anemia, unspecified: Secondary | ICD-10-CM

## 2012-01-12 LAB — BASIC METABOLIC PANEL
BUN: 10 mg/dL (ref 6–23)
Calcium: 8.7 mg/dL (ref 8.4–10.5)
Creatinine, Ser: 0.6 mg/dL (ref 0.4–1.2)
GFR: 120.41 mL/min (ref >60.00)

## 2012-01-12 LAB — CBC WITH DIFFERENTIAL/PLATELET
Basophils Absolute: 0 10*3/uL (ref 0.0–0.1)
Eosinophils Absolute: 0.1 10*3/uL (ref 0.0–0.7)
HCT: 34.1 % — ABNORMAL LOW (ref 36.0–46.0)
Lymphs Abs: 1.9 10*3/uL (ref 0.7–4.0)
MCHC: 32.1 g/dL (ref 30.0–36.0)
MCV: 87.6 fl (ref 78.0–100.0)
Monocytes Absolute: 0.4 10*3/uL (ref 0.1–1.0)
Monocytes Relative: 6.3 % (ref 3.0–12.0)
Neutro Abs: 3.3 10*3/uL (ref 1.4–7.7)
Platelets: 253 10*3/uL (ref 150.0–400.0)
RDW: 14.5 % (ref 11.5–14.6)

## 2012-01-12 MED ORDER — PREDNISONE 20 MG PO TABS: ORAL_TABLET | ORAL | Status: DC

## 2012-01-12 NOTE — Progress Notes (Signed)
  Subjective:    Patient ID: Melissa [name], female    DOB: November 20, 1965, 46 y.o.   MRN: [id number]  HPI Renad ann 46 year old married female nonsmoker who comes in today for evaluation of multiple symptoms  She states that recently her migraines are becoming longer. She takes the Zomig and it does help another lasting and hanging on for 3-4 days.  She's also noticed a change in her sleep cycle and despite the Klonopin 0.5 mg nightly she wakes up at 4:00 in the morning and can't go back to sleep  She had labs in the spring because of gastric bypass surgery her serum iron was slightly low. She's also having more leg cramps.  She has been to see Dr. Nolen Mu in the past because of changes years ago   Review of Systems Neurologic review of systems and psychiatric review of systems otherwise negative    Objective:   Physical Exam Well-developed well-nourished female in no acute distress       Assessment & Plan:  Migraine headaches increasing in duration question secondary to sleep dysfunction plan short course of prednisone, increase Klonopin to 1 mg daily, consult from Dr. She Nolen Mu  History of gastric bypass with low iron and now more leg cramps check iron level

## 2012-01-12 NOTE — Patient Instructions (Addendum)
Take a short course of prednisone as we discussed  Double the Klonopin temporarily  , Dr. Andee Poles for a re\re evaluation  All call you next week when I gets her lab work back

## 2012-01-13 LAB — FERRITIN: Ferritin: 8.3 ng/mL — ABNORMAL LOW (ref 10.0–291.0)

## 2012-01-13 LAB — TSH: TSH: 0.5 u[IU]/mL (ref 0.35–5.50)

## 2012-01-13 LAB — VITAMIN B12: Vitamin B-12: 501 pg/mL (ref 211–911)

## 2012-01-19 ENCOUNTER — Encounter: Payer: Self-pay | Admitting: Family Medicine

## 2012-03-10 ENCOUNTER — Other Ambulatory Visit: Payer: Self-pay | Admitting: *Deleted

## 2012-03-10 MED ORDER — HYDROCODONE-ACETAMINOPHEN 7.5-325 MG PO TABS: 1.0000 | ORAL_TABLET | Freq: Three times a day (TID) | ORAL | Status: DC | PRN

## 2012-05-06 ENCOUNTER — Other Ambulatory Visit: Payer: Self-pay

## 2012-05-22 ENCOUNTER — Telehealth: Payer: Self-pay | Admitting: Family Medicine

## 2012-05-22 NOTE — Telephone Encounter (Signed)
Ok w/ me 

## 2012-05-22 NOTE — Telephone Encounter (Signed)
Patient would like to switch from Dr. Tawanna Cooler to Dr. Beverely Low. Is this ok?

## 2012-05-26 ENCOUNTER — Other Ambulatory Visit: Payer: Self-pay | Admitting: Family Medicine

## 2012-06-08 NOTE — Telephone Encounter (Signed)
Melissa [name], yes this is fine with me also. And please tell the patient to move her entire family to you!!!!!!!!!!!!!!!!

## 2012-06-21 ENCOUNTER — Encounter: Payer: Self-pay | Admitting: Family Medicine

## 2012-06-21 ENCOUNTER — Other Ambulatory Visit (INDEPENDENT_AMBULATORY_CARE_PROVIDER_SITE_OTHER): Payer: 59

## 2012-06-21 ENCOUNTER — Ambulatory Visit (INDEPENDENT_AMBULATORY_CARE_PROVIDER_SITE_OTHER): Payer: 59 | Admitting: Family Medicine

## 2012-06-21 VITALS — BP 110/60 | HR 77 | Temp 98.3°F | Ht 66.5 in | Wt 160.4 lb

## 2012-06-21 DIAGNOSIS — Z0000 Encounter for general adult medical examination without abnormal findings: Secondary | ICD-10-CM

## 2012-06-21 DIAGNOSIS — E2839 Other primary ovarian failure: Secondary | ICD-10-CM

## 2012-06-21 DIAGNOSIS — Z9884 Bariatric surgery status: Secondary | ICD-10-CM

## 2012-06-21 DIAGNOSIS — D649 Anemia, unspecified: Secondary | ICD-10-CM

## 2012-06-21 DIAGNOSIS — Z1239 Encounter for other screening for malignant neoplasm of breast: Secondary | ICD-10-CM

## 2012-06-21 DIAGNOSIS — G4700 Insomnia, unspecified: Secondary | ICD-10-CM

## 2012-06-21 LAB — BASIC METABOLIC PANEL
CO2: 23 mEq/L (ref 19–32)
Chloride: 109 mEq/L (ref 96–112)
GFR: 146.55 mL/min (ref >60.00)
Glucose, Bld: 88 mg/dL (ref 70–99)
Potassium: 3.6 mEq/L (ref 3.5–5.1)
Sodium: 140 mEq/L (ref 135–145)

## 2012-06-21 LAB — HEPATIC FUNCTION PANEL
ALT: 37 U/L — ABNORMAL HIGH (ref 0–35)
AST: 28 U/L (ref 0–37)
Albumin: 3.8 g/dL (ref 3.5–5.2)
Alkaline Phosphatase: 101 U/L (ref 39–117)
Total Protein: 7.2 g/dL (ref 6.0–8.3)

## 2012-06-21 LAB — CBC WITH DIFFERENTIAL/PLATELET
Basophils Absolute: 0 10*3/uL (ref 0.0–0.1)
Eosinophils Absolute: 0.1 10*3/uL (ref 0.0–0.7)
HCT: 34.3 % — ABNORMAL LOW (ref 36.0–46.0)
Hemoglobin: 11.7 g/dL — ABNORMAL LOW (ref 12.0–15.0)
Lymphs Abs: 1.8 10*3/uL (ref 0.7–4.0)
MCHC: 34 g/dL (ref 30.0–36.0)
Monocytes Absolute: 0.3 10*3/uL (ref 0.1–1.0)
Neutro Abs: 3.8 10*3/uL (ref 1.4–7.7)
Platelets: 264 10*3/uL (ref 150.0–400.0)
RDW: 14.8 % — ABNORMAL HIGH (ref 11.5–14.6)

## 2012-06-21 LAB — LIPID PANEL
Cholesterol: 145 mg/dL (ref 0–200)
LDL Cholesterol: 73 mg/dL (ref 0–99)
Total CHOL/HDL Ratio: 2
Triglycerides: 36 mg/dL (ref 0.0–149.0)
VLDL: 7.2 mg/dL (ref 0.0–40.0)

## 2012-06-21 LAB — IBC PANEL: Saturation Ratios: 8.6 % — ABNORMAL LOW (ref 20.0–50.0)

## 2012-06-21 LAB — MAGNESIUM: Magnesium: 2.2 mg/dL (ref 1.5–2.5)

## 2012-06-21 MED ORDER — TRAZODONE HCL 50 MG PO TABS: 25.0000 mg | ORAL_TABLET | Freq: Every evening | ORAL | Status: DC | PRN

## 2012-06-21 NOTE — Assessment & Plan Note (Signed)
New to provider.  Pt w/ extensive list of required labs.  Will fax to surgeon.

## 2012-06-21 NOTE — Patient Instructions (Addendum)
Follow up in 1 month to recheck sleep We'll notify you of your lab results and make any changes if needed Start the Trazodone nightly- start w/ 1/2 tab and increase to 1 tab if needed We'll call you with your bone density and mammo appt Call with any questions or concerns Think of [place] as your home base Welcome!  We're glad to have you!!!

## 2012-06-21 NOTE — Assessment & Plan Note (Signed)
New to provider, ongoing for pt.  Has never tried prescription sleep med w/ exception of klonopin which was not effective.  Start low dose trazodone and follow closely.  Reviewed importance of sleep hygiene.  Pt expressed understanding and is in agreement w/ plan.

## 2012-06-21 NOTE — Progress Notes (Signed)
  Subjective:    Patient ID: Melissa [name], female    DOB: 06-17-1965, 47 y.o.   MRN: [id number]  HPI CPE- GYN Melissa [name]), overdue for mammo [facility]).  Too young for colonoscopy.  No recent DEXA.  Insomnia- 'tremendous sleep problems'.  Having trouble both falling and staying asleep.  Having difficulty staying awake during the day, not feeling safe when driving.  Pt will find herself very frustrated w/ inability to fall asleep w/in 30 minutes and will then get out of bed for 30-45 minutes.  Waking multiple times.  Is now trying to stay up very late in order to get 'tired enough' to fall asleep.  Previously on Klonopin w/out results.  Will take Robaxin and Hydrocodone but this wears off.  Has never taken sleep med.  S/p gastric bypass- following w/ annual labs.  Reports last year iron was low and protein was low.  Has had hx of intolerance to oral iron.  (Dr Melissa [name])  Pt has list of required labs.   Review of Systems Patient reports no vision/ hearing changes, adenopathy,fever, weight change,  persistant/recurrent hoarseness , swallowing issues, chest pain, palpitations, edema, persistant/recurrent cough, hemoptysis, dyspnea (rest/exertional/paroxysmal nocturnal), gastrointestinal bleeding (melena, rectal bleeding), abdominal pain, significant heartburn, bowel changes, GU symptoms (dysuria, hematuria, incontinence), Gyn symptoms (abnormal  bleeding, pain),  syncope, focal weakness, memory loss, numbness & tingling, skin/hair/nail changes, abnormal bruising or bleeding, anxiety, or depression.     Objective:   Physical Exam General Appearance:    Alert, cooperative, no distress, appears stated age  Head:    Normocephalic, without obvious abnormality, atraumatic  Eyes:    PERRL, conjunctiva/corneas clear, EOM's intact, fundi    benign, both eyes  Ears:    Normal TM's and external ear canals, both ears  Nose:   Nares normal, septum midline, mucosa normal, no drainage    or sinus tenderness  Throat:    Lips, mucosa, and tongue normal; teeth and gums normal  Neck:   Supple, symmetrical, trachea midline, no adenopathy;    Thyroid: no enlargement/tenderness/nodules  Back:     Symmetric, no curvature, ROM normal, no CVA tenderness  Lungs:     Clear to auscultation bilaterally, respirations unlabored  Chest Wall:    No tenderness or deformity   Heart:    Regular rate and rhythm, S1 and S2 normal, no murmur, rub   or gallop  Breast Exam:    Deferred to GYN  Abdomen:     Soft, non-tender, bowel sounds active all four quadrants,    no masses, no organomegaly  Genitalia:    Deferred to GYN  Rectal:    Extremities:   Extremities normal, atraumatic, no cyanosis or edema  Pulses:   2+ and symmetric all extremities  Skin:   Skin color, texture, turgor normal, no rashes or lesions  Lymph nodes:   Cervical, supraclavicular, and axillary nodes normal  Neurologic:   CNII-XII intact, normal strength, sensation and reflexes    throughout          Assessment & Plan:

## 2012-06-21 NOTE — Assessment & Plan Note (Signed)
Pt's PE WNL.  Pt due for mammo and DEXA.  Too young for colonoscopy.  Check labs.  Anticipatory guidance provided.

## 2012-06-22 LAB — PREALBUMIN: Prealbumin: 19.4 mg/dL (ref 17.0–34.0)

## 2012-06-23 LAB — COPPER, SERUM: Copper: 108 ug/dL (ref 70–175)

## 2012-06-25 LAB — VITAMIN D 1,25 DIHYDROXY: Vitamin D 1, 25 (OH)2 Total: 120 pg/mL — ABNORMAL HIGH (ref 18–72)

## 2012-06-26 LAB — VITAMIN A

## 2012-06-26 LAB — ZINC: Zinc: 54 ug/dL — ABNORMAL LOW (ref 60–130)

## 2012-06-27 ENCOUNTER — Encounter: Payer: Self-pay | Admitting: Family Medicine

## 2012-07-21 ENCOUNTER — Encounter: Payer: Self-pay | Admitting: Family Medicine

## 2012-07-21 ENCOUNTER — Ambulatory Visit (INDEPENDENT_AMBULATORY_CARE_PROVIDER_SITE_OTHER): Payer: 59 | Admitting: Family Medicine

## 2012-07-21 VITALS — BP 100/60 | HR 73 | Temp 97.9°F | Ht 66.5 in | Wt 167.4 lb

## 2012-07-21 DIAGNOSIS — G4700 Insomnia, unspecified: Secondary | ICD-10-CM

## 2012-07-21 MED ORDER — TRAZODONE HCL 100 MG PO TABS: 100.0000 mg | ORAL_TABLET | Freq: Every day | ORAL | Status: DC

## 2012-07-21 NOTE — Patient Instructions (Addendum)
Call me in about a month to see how things are going Increase the Trazodone to 100mg  nightly- if this is too much, decrease back to 1/2 tab Try and spend more hours in bed Call with any questions or concerns Hang in there!!!

## 2012-07-21 NOTE — Progress Notes (Signed)
  Subjective:    Patient ID: Melissa [name], female    DOB: [birth year], 47 y.o.   MRN: [id number]  HPI Insomnia- started on Trazodone at last visit.  Reports sleeping is 'better' and therefor is feeling better.  Waking less frequently at night.  Taking 50mg  Trazodone nightly.  Is waking regularly at 2-3am thinking it's time to get up.  Not experiencing any AM hangover from med.  Is thinking she wants to increase med.   Review of Systems For ROS see HPI     Objective:   Physical Exam  Vitals reviewed. Constitutional: She is oriented to person, place, and time. She appears well-developed and well-nourished. No distress.  Neurological: She is alert and oriented to person, place, and time.  Psychiatric: She has a normal mood and affect. Her behavior is normal. Judgment and thought content normal.          Assessment & Plan:

## 2012-07-21 NOTE — Assessment & Plan Note (Signed)
Improving but still room to improve.  Will increase trazodone to 100mg  nightly.  If pt has sxs of AM hangover will decrease to 75 mg nightly (1.5 tabs of the 50mg ).  Pt expressed understanding and is in agreement w/ plan.

## 2012-08-01 ENCOUNTER — Telehealth: Payer: Self-pay | Admitting: *Deleted

## 2012-08-01 ENCOUNTER — Encounter: Payer: Self-pay | Admitting: Family Medicine

## 2012-08-01 ENCOUNTER — Other Ambulatory Visit: Payer: Self-pay | Admitting: Radiology

## 2012-08-01 NOTE — Telephone Encounter (Signed)
Lm @ (3:16pm) asking the pt to RTC.//AB/CMA

## 2012-08-01 NOTE — Telephone Encounter (Signed)
If her Vit D is high- she should hold the Vit D but make sure she is taking the calcium daily (sorry for the confusion!) There is no need to repeat the scan before 2 years (and insurance will not pay for it since that is the recommendation)

## 2012-08-01 NOTE — Telephone Encounter (Signed)
Spoke with the pt and informed her of recent BDS results and recommendations to start 1200 Calcium and 800 Vit.D daily.  Pt understood but she questioning the note regarding her recent labs where it mentioned that her Vit. D was high and to please hold her supplement.  She also wanted to know when she's to repeat the BDS,and I informed her that the recommendation is to follow-up in [place] in 2 years.  Pt want to know if she should have a follow-up sooner than 2 years.  Please advise.//AB/CMA

## 2012-08-02 NOTE — Telephone Encounter (Signed)
Pt notified, expressed understanding.

## 2012-08-02 NOTE — Telephone Encounter (Signed)
Called pt and LMOVM to return call.  °

## 2012-08-09 ENCOUNTER — Encounter: Payer: Self-pay | Admitting: Family Medicine

## 2012-08-10 ENCOUNTER — Encounter: Payer: Self-pay | Admitting: Family Medicine

## 2012-08-28 ENCOUNTER — Encounter: Payer: Self-pay | Admitting: Family Medicine

## 2012-08-28 ENCOUNTER — Ambulatory Visit (INDEPENDENT_AMBULATORY_CARE_PROVIDER_SITE_OTHER): Payer: 59 | Admitting: Family Medicine

## 2012-08-28 VITALS — BP 120/70 | HR 63 | Temp 98.1°F | Ht 66.5 in | Wt 164.0 lb

## 2012-08-28 DIAGNOSIS — L659 Nonscarring hair loss, unspecified: Secondary | ICD-10-CM

## 2012-08-28 DIAGNOSIS — G479 Sleep disorder, unspecified: Secondary | ICD-10-CM | POA: Insufficient documentation

## 2012-08-28 LAB — CBC WITH DIFFERENTIAL/PLATELET
Basophils Absolute: 0.1 10*3/uL (ref 0.0–0.1)
Eosinophils Absolute: 0.2 10*3/uL (ref 0.0–0.7)
Hemoglobin: 11.6 g/dL — ABNORMAL LOW (ref 12.0–15.0)
Lymphocytes Relative: 29.8 % (ref 12.0–46.0)
Lymphs Abs: 1.8 10*3/uL (ref 0.7–4.0)
MCHC: 33.1 g/dL (ref 30.0–36.0)
Neutro Abs: 3.7 10*3/uL (ref 1.4–7.7)
RDW: 13.8 % (ref 11.5–14.6)

## 2012-08-28 LAB — BASIC METABOLIC PANEL
Calcium: 8.9 mg/dL (ref 8.4–10.5)
Creatinine, Ser: 0.6 mg/dL (ref 0.4–1.2)
GFR: 117.71 mL/min (ref >60.00)

## 2012-08-28 NOTE — Assessment & Plan Note (Signed)
Pt has always struggled w/ insomnia but now finds herself sleeping 'better' at night but nodding off as soon as she sits still during the day- 'i can't control it'.  Pt is on multiple sedating meds, but trazodone is only new one.  Will hold and see if daytime sleepiness improves.  If not, may need narcolepsy work up.  Will follow.

## 2012-08-28 NOTE — Progress Notes (Signed)
  Subjective:    Patient ID: Melissa [name], female    DOB: [birth year], 47 y.o.   MRN: [id number]  HPI Hair loss- pt reports she has always had fine, thin hair but she is losing more recently.  Feels part is widening.  sxs 1st noticed ~3 weeks ago.  Pt reports she has had increased hair loss since gastric bypass but this is worse than usual.  Normal TSH in April.  No new meds recently w/ exception of Trazodone.    Daytime sleepiness- pt finds herself nodding off at her desk, stoplight, drive through.  Doesn't feel sleepy when waking.  No new meds- on Zanaflex and hydrocodone at night for back, on topamax.  Not on klonopin.   Review of Systems For ROS see HPI     Objective:   Physical Exam  Vitals reviewed. Constitutional: She is oriented to person, place, and time. She appears well-developed and well-nourished. No distress.  HENT:  Head: Normocephalic and atraumatic.  Fine, thin hair w/ wide R part No evidence of broken hair or scalp abnormality  Neck: Normal range of motion. Neck supple. No thyromegaly present.  Cardiovascular: Normal rate, regular rhythm, normal heart sounds and intact distal pulses.   Pulmonary/Chest: Effort normal and breath sounds normal. No respiratory distress. She has no wheezes. She has no rales.  Lymphadenopathy:    She has no cervical adenopathy.  Neurological: She is alert and oriented to person, place, and time.  Skin: Skin is warm and dry.  Psychiatric: She has a normal mood and affect. Her behavior is normal.          Assessment & Plan:

## 2012-08-28 NOTE — Assessment & Plan Note (Addendum)
New.  Wonder if this is medication induced from recent Trazodone start.  Will hold off on meds and see if hair loss slows.  If not, will refer to derm.  Already on Biotin supplement.  Pt expressed understanding and is in agreement w/ plan.

## 2012-08-28 NOTE — Patient Instructions (Addendum)
Hold Trazodone for now Monitor amount of hair loss- if steady or worsening, please call and we'll set you up w/ derm We'll notify you of your lab results If the daytime nodding off continues even after stopping the trazodone, please let me know! Call with any questions or concerns Hang in there!!!

## 2012-09-01 ENCOUNTER — Other Ambulatory Visit: Payer: Self-pay | Admitting: Family Medicine

## 2012-09-19 ENCOUNTER — Telehealth: Payer: Self-pay | Admitting: Family Medicine

## 2012-09-19 DIAGNOSIS — G479 Sleep disorder, unspecified: Secondary | ICD-10-CM

## 2012-09-19 DIAGNOSIS — L659 Nonscarring hair loss, unspecified: Secondary | ICD-10-CM

## 2012-09-19 NOTE — Telephone Encounter (Signed)
Patient called stating she is still having the symptoms she was seen for on 08/28/12 and would like to proceed with dermatology referral. She also has a mole that has come up around her hair line and would like that examined as well.  Patient also having sleep problems since going off trazadone. She thinks she may need to see a sleep specialist.

## 2012-09-19 NOTE — Telephone Encounter (Signed)
Please advise on sleep issues

## 2012-09-20 NOTE — Telephone Encounter (Signed)
Please refer to sleep specialist for 'disordered sleep'

## 2012-09-20 NOTE — Telephone Encounter (Signed)
Left Pt detail VM that referrals have been placed.

## 2012-09-26 ENCOUNTER — Encounter: Payer: Self-pay | Admitting: Family Medicine

## 2012-09-28 ENCOUNTER — Ambulatory Visit (HOSPITAL_COMMUNITY)
Admission: RE | Admit: 2012-09-28 | Discharge: 2012-09-28 | Disposition: A | Discharge status: Home / Self Care | Payer: 59 | Source: Ambulatory Visit | Attending: Rheumatology | Admitting: Rheumatology

## 2012-09-28 ENCOUNTER — Encounter (HOSPITAL_COMMUNITY): Payer: Self-pay

## 2012-09-28 DIAGNOSIS — Z9884 Bariatric surgery status: Secondary | ICD-10-CM | POA: Insufficient documentation

## 2012-09-28 DIAGNOSIS — M810 Age-related osteoporosis without current pathological fracture: Secondary | ICD-10-CM | POA: Insufficient documentation

## 2012-09-28 HISTORY — DX: Age-related osteoporosis without current pathological fracture: M81.0

## 2012-09-28 MED ORDER — SODIUM CHLORIDE 0.9 % IV SOLN
Freq: Once | INTRAVENOUS | Status: AC
  Administered 2012-09-28: 13:00:00 via INTRAVENOUS

## 2012-09-28 MED ORDER — ZOLEDRONIC ACID 5 MG/100ML IV SOLN
5.0000 mg | Freq: Once | INTRAVENOUS | Status: AC
  Administered 2012-09-28: 5 mg via INTRAVENOUS
  Filled 2012-09-28: qty 100

## 2012-10-03 ENCOUNTER — Encounter (HOSPITAL_COMMUNITY): Payer: 59

## 2012-10-04 ENCOUNTER — Encounter (HOSPITAL_COMMUNITY): Payer: 59

## 2012-10-12 ENCOUNTER — Institutional Professional Consult (permissible substitution): Payer: Self-pay | Admitting: Neurology

## 2012-10-13 ENCOUNTER — Encounter: Payer: Self-pay | Admitting: Family Medicine

## 2012-11-06 ENCOUNTER — Other Ambulatory Visit: Payer: Self-pay | Admitting: Family Medicine

## 2012-11-08 NOTE — Telephone Encounter (Signed)
Med filled.  

## 2012-12-05 ENCOUNTER — Institutional Professional Consult (permissible substitution): Payer: Self-pay | Admitting: Neurology

## 2013-01-04 ENCOUNTER — Encounter (HOSPITAL_COMMUNITY): Payer: Self-pay | Admitting: Emergency Medicine

## 2013-01-04 ENCOUNTER — Emergency Department (HOSPITAL_COMMUNITY)
Admission: EM | Admit: 2013-01-04 | Discharge: 2013-01-05 | Disposition: A | Discharge status: Home / Self Care | Payer: 59 | Attending: Emergency Medicine | Admitting: Emergency Medicine

## 2013-01-04 ENCOUNTER — Ambulatory Visit (INDEPENDENT_AMBULATORY_CARE_PROVIDER_SITE_OTHER): Payer: 59 | Admitting: Family Medicine

## 2013-01-04 ENCOUNTER — Encounter: Payer: Self-pay | Admitting: Family Medicine

## 2013-01-04 VITALS — BP 110/72 | HR 67 | Temp 97.4°F | Wt 166.9 lb

## 2013-01-04 DIAGNOSIS — H93239 Hyperacusis, unspecified ear: Secondary | ICD-10-CM | POA: Insufficient documentation

## 2013-01-04 DIAGNOSIS — Z79899 Other long term (current) drug therapy: Secondary | ICD-10-CM | POA: Insufficient documentation

## 2013-01-04 DIAGNOSIS — Z8679 Personal history of other diseases of the circulatory system: Secondary | ICD-10-CM | POA: Insufficient documentation

## 2013-01-04 DIAGNOSIS — Z87891 Personal history of nicotine dependence: Secondary | ICD-10-CM | POA: Insufficient documentation

## 2013-01-04 DIAGNOSIS — H53149 Visual discomfort, unspecified: Secondary | ICD-10-CM | POA: Insufficient documentation

## 2013-01-04 DIAGNOSIS — G43909 Migraine, unspecified, not intractable, without status migrainosus: Secondary | ICD-10-CM

## 2013-01-04 DIAGNOSIS — G2581 Restless legs syndrome: Secondary | ICD-10-CM | POA: Insufficient documentation

## 2013-01-04 DIAGNOSIS — M810 Age-related osteoporosis without current pathological fracture: Secondary | ICD-10-CM | POA: Insufficient documentation

## 2013-01-04 DIAGNOSIS — F329 Major depressive disorder, single episode, unspecified: Secondary | ICD-10-CM | POA: Insufficient documentation

## 2013-01-04 DIAGNOSIS — M129 Arthropathy, unspecified: Secondary | ICD-10-CM | POA: Insufficient documentation

## 2013-01-04 DIAGNOSIS — F3289 Other specified depressive episodes: Secondary | ICD-10-CM | POA: Insufficient documentation

## 2013-01-04 DIAGNOSIS — J45909 Unspecified asthma, uncomplicated: Secondary | ICD-10-CM | POA: Insufficient documentation

## 2013-01-04 HISTORY — DX: Migraine, unspecified, not intractable, without status migrainosus: G43.909

## 2013-01-04 MED ORDER — SODIUM CHLORIDE 0.9 % IV BOLUS (SEPSIS)
1000.0000 mL | Freq: Once | INTRAVENOUS | Status: AC
  Administered 2013-01-04: 1000 mL via INTRAVENOUS

## 2013-01-04 MED ORDER — PROCHLORPERAZINE EDISYLATE 5 MG/ML IJ SOLN
10.0000 mg | Freq: Once | INTRAMUSCULAR | Status: AC
  Administered 2013-01-04: 10 mg via INTRAVENOUS
  Filled 2013-01-04: qty 2

## 2013-01-04 MED ORDER — KETOROLAC TROMETHAMINE 60 MG/2ML IM SOLN
60.0000 mg | Freq: Once | INTRAMUSCULAR | Status: AC
  Administered 2013-01-04: 60 mg via INTRAMUSCULAR

## 2013-01-04 MED ORDER — ZOLMITRIPTAN 5 MG PO TBDP: 5.0000 mg | ORAL_TABLET | ORAL | Status: DC | PRN

## 2013-01-04 NOTE — ED Provider Notes (Signed)
TIME SEEN: 11:15 PM  CHIEF COMPLAINT: Migraine headache  HPI: Patient is a 47 y.o. female with a history of asthma, depression, migraines who presents the emergency department with her typical migraine headache that started gradually on Monday, 4 days ago. She reports it as a pressure like pain that is diffuse. She denies any thunderclap headache. No recent head injury. No fever, neck pain or neck stiffness. No numbness, tingling or focal weakness. She has photophobia and phonophobia. No nausea or vomiting. She reports that she stop taking her prophylactic Topamax in August but restarted this medication yesterday. She's also been taking Zomig once daily. She's been seen by her primary care physician today and was given an injection of Toradol without relief.   ROS: See HPI Constitutional: no fever  Eyes: no drainage  ENT: no runny nose   Cardiovascular:  no chest pain  Resp: no SOB  GI: no vomiting GU: no dysuria Integumentary: no rash  Allergy: no hives  Musculoskeletal: no leg swelling  Neurological: no slurred speech ROS otherwise negative  PAST MEDICAL HISTORY/PAST SURGICAL HISTORY:  Past Medical History  Diagnosis Date  . Allergy   . Asthma   . Arthritis   . Depression   . RLS (restless legs syndrome)   . ADD (attention deficit disorder)   . Osteoporosis   . Migraine     MEDICATIONS:  Prior to Admission medications   Medication Sig Start Date End Date Taking? Authorizing Provider  albuterol (PROAIR HFA) 108 (90 BASE) MCG/ACT inhaler Inhale 2 puffs into the lungs every 6 (six) hours as needed. 11/26/10  Yes Roderick Pee, MD  calcium citrate (CALCITRATE - DOSED IN MG ELEMENTAL CALCIUM) 950 MG tablet Take 2 tablets by mouth daily.   Yes Historical Provider, MD  cetirizine (ZYRTEC) 10 MG tablet Take 10 mg by mouth daily.     Yes Historical Provider, MD  methocarbamol (ROBAXIN) 500 MG tablet Take 1 tablet by mouth at bedtime as needed. 04/01/12  Yes Historical Provider, MD   Multiple Vitamin (MULTIVITAMIN) tablet Take 1 tablet by mouth daily.     Yes Historical Provider, MD  pramipexole (MIRAPEX) 1 MG tablet TAKE 1 TABLET BY MOUTH TWICE DAILY 11/06/12  Yes Sheliah Hatch, MD  tiZANidine (ZANAFLEX) 4 MG tablet Take 1 tablet by mouth daily as needed. 03/31/12  Yes Historical Provider, MD  topiramate (TOPAMAX) 100 MG tablet TAKE 1 TABLET BY MOUTH AT BEDTIME 05/26/12  Yes Roderick Pee, MD  HYDROcodone-acetaminophen [facility]) 10-325 MG per tablet Take 1 tablet by mouth at bedtime as needed. 04/01/12   Historical Provider, MD  HYDROcodone-acetaminophen (NORCO) 7.5-325 MG per tablet Take 1 tablet by mouth every 8 (eight) hours as needed. 03/10/12   Roderick Pee, MD  zolmitriptan (ZOMIG ZMT) 5 MG disintegrating tablet Take 1 tablet (5 mg total) by mouth as needed for migraine. 01/04/13   Lelon Perla, DO    ALLERGIES:  Allergies  Allergen Reactions  . Other Other (See Comments)    Honey dew melon: unknown reaction    SOCIAL HISTORY:  History  Substance Use Topics  . Smoking status: Former Games developer  . Smokeless tobacco: Not on file  . Alcohol Use: Yes     Comment: socially    FAMILY HISTORY: Family History  Problem Relation Age of Onset  . Cancer Mother     breast  . Cancer Sister     breast  . Cancer Maternal Aunt     breast  .  Cancer Maternal Grandmother     breast  . Cancer Maternal Aunt     breast  . Depression      fhx    EXAM: BP 134/65  Pulse 72  Temp(Src) 98.6 F (37 C) (Oral)  Resp 18  SpO2 100% CONSTITUTIONAL: Alert and oriented and responds appropriately to questions. Well-appearing; well-nourished HEAD: Normocephalic EYES: Conjunctivae clear, PERRL ENT: normal nose; no rhinorrhea; moist mucous membranes; pharynx without lesions noted NECK: Supple, no meningismus, no LAD  CARD: RRR; S1 and S2 appreciated; no murmurs, no clicks, no rubs, no gallops RESP: Normal chest excursion without splinting or tachypnea; breath sounds clear  and equal bilaterally; no wheezes, no rhonchi, no rales,  ABD/GI: Normal bowel sounds; non-distended; soft, non-tender, no rebound, no guarding BACK:  The back appears normal and is non-tender to palpation, there is no CVA tenderness EXT: Normal ROM in all joints; non-tender to palpation; no edema; normal capillary refill; no cyanosis    SKIN: Normal color for age and race; warm NEURO: Moves all extremities equally; cranial nerves II through XII intact, sensation to light touch intact diffusely, strength 5/5 in all 4 extremity, no dysmetria to finger-nose testing, normal gait PSYCH: The patient's mood and manner are appropriate. Grooming and personal hygiene are appropriate.  MEDICAL DECISION MAKING: Patient here with typical migraine headache. I am not concerned for infarct, intracranial hemorrhage, meningitis or other life-threatening illness. Will give Compazine and IV fluids and reassess. Anticipate discharge home with outpatient followup.  ED PROGRESS: Patient reports her headache is almost completely gone. She feels that she can manage her headache at home and would like discharge. We'll discharge with prescription for Fioricet. Given return precautions. Patient has PCP for followup. Patient and significant other at bedside verbalize understanding and are comfortable with plan.     Layla Maw Lliam Hoh, DO 01/05/13 0010

## 2013-01-04 NOTE — Progress Notes (Signed)
  Subjective:    Melissa [name] is a 47 y.o. female who presents for follow-up of migraine headaches.  Pt ran out of topamax and did not refill it.   Home treatment has included Percocet and Zomig with no improvement. Headaches are occurring every day. Generally, the headaches last about now all day--she did wake up feeling good but symptoms gradually returned -. Work attendance or other daily activities are affected by the headaches. The patient denies depression, dizziness, loss of balance, muscle weakness, numbness of extremities, speech difficulties, vision problems, vomiting in the early morning and worsening school/work performance.  The following portions of the patient's history were reviewed and updated as appropriate: allergies, current medications, past family history, past medical history, past social history, past surgical history and problem list.  Review of Systems Pertinent items are noted in HPI.    Objective:    BP 110/72  Pulse 67  Temp(Src) 97.4 F (36.3 C) (Oral)  Wt 166 lb 14.4 oz (75.705 kg)  BMI 26.13 kg/m2  SpO2 98% General appearance: alert, cooperative, appears stated age and no distress Eyes: conjunctivae/corneas clear. PERRL, EOM's intact. Fundi benign. Ears: normal TM's and external ear canals both ears Nose: Nares normal. Septum midline. Mucosa normal. No drainage or sinus tenderness. Throat: lips, mucosa, and tongue normal; teeth and gums normal Neck: no adenopathy, no carotid bruit, no JVD, supple, symmetrical, trachea midline and thyroid not enlarged, symmetric, no tenderness/mass/nodules Lungs: clear to auscultation bilaterally Heart: S1, S2 normal Neurologic: Alert and oriented X 3, normal strength and tone. Normal symmetric reflexes. Normal coordination and gait    Assessment:    Classic migraine    Plan:    Continue present treatment and plan. Lie in darkened room and apply cold packs as needed for pain. Prophylactic therapy: oral narcotic  analgesics, Toradol injections as needed and triptan therapy due to high frequency of pain. restart topamax

## 2013-01-04 NOTE — ED Notes (Addendum)
Pt reports migraine headache since Monday - has tried taking otc medication without relief. Saw pcp today, gave pt shot of Toradol, told to come into ED if pain did not improve. Pt states she usually takes topamax to prevent migraines, ran out of rx, had it refilled yesterday. Reports light sensitivity. Denies n/v/d

## 2013-01-04 NOTE — Patient Instructions (Signed)

## 2013-01-05 MED ORDER — BUTALBITAL-APAP-CAFFEINE 50-325-40 MG PO TABS: 1.0000 | ORAL_TABLET | Freq: Four times a day (QID) | ORAL | Status: AC | PRN

## 2013-01-25 ENCOUNTER — Other Ambulatory Visit: Payer: Self-pay

## 2013-02-01 LAB — HM MAMMOGRAPHY

## 2013-02-08 ENCOUNTER — Encounter: Payer: Self-pay | Admitting: General Practice

## 2013-02-18 ENCOUNTER — Other Ambulatory Visit: Payer: Self-pay | Admitting: Family Medicine

## 2013-02-19 NOTE — Telephone Encounter (Signed)
Med filled.  

## 2013-03-14 ENCOUNTER — Ambulatory Visit (INDEPENDENT_AMBULATORY_CARE_PROVIDER_SITE_OTHER): Payer: 59 | Admitting: Internal Medicine

## 2013-03-14 ENCOUNTER — Encounter: Payer: Self-pay | Admitting: Internal Medicine

## 2013-03-14 VITALS — BP 122/70 | HR 76 | Temp 98.6°F | Wt 175.0 lb

## 2013-03-14 DIAGNOSIS — J329 Chronic sinusitis, unspecified: Secondary | ICD-10-CM

## 2013-03-14 DIAGNOSIS — J069 Acute upper respiratory infection, unspecified: Secondary | ICD-10-CM

## 2013-03-14 MED ORDER — PHENYLEPH-PROMETHAZINE-COD 5-6.25-10 MG/5ML PO SYRP: 5.0000 mL | ORAL_SOLUTION | ORAL | Status: DC | PRN

## 2013-03-14 MED ORDER — LEVOFLOXACIN 500 MG PO TABS: 500.0000 mg | ORAL_TABLET | Freq: Every day | ORAL | Status: DC

## 2013-03-14 NOTE — Progress Notes (Signed)
Pre-visit discussion using our clinic review tool. No additional management support is needed unless otherwise documented below in the visit note.  

## 2013-03-14 NOTE — Progress Notes (Signed)
Patient ID: Melissa [name], female   DOB: 07-19-1965, 47 y.o.   MRN: [id number]   Subjective:   HPI  complains of head cold symptoms  Onset 48h ago, improving symptoms  associated with rhinorrhea, sneezing, sore throat, mild headache and low grade fever Also myalgias, sinus pressure and mild chest congestion Mild relief with OTC meds Precipitated by sick contacts and weather change  Past Medical History  Diagnosis Date  . Allergy   . Asthma   . Arthritis   . Depression   . RLS (restless legs syndrome)   . ADD (attention deficit disorder)   . Osteoporosis   . Migraine     Review of Systems Constitutional: No fever or night sweats, no unexpected weight change Pulmonary: No pleurisy or hemoptysis Cardiovascular: No chest pain or palpitations     Objective:   Physical Exam BP 122/70  Pulse 76  Temp(Src) 98.6 F (37 C) (Oral)  Wt 175 lb (79.379 kg)  SpO2 98% GEN: mildly ill appearing and audible head/chest congestion HENT: NCAT, mild sinus tenderness bilaterally, nares with clear discharge, oropharynx mild erythema, no exudate Eyes: Vision grossly intact, no conjunctivitis Lungs: Clear to auscultation without rhonchi or wheeze, no increased work of breathing Cardiovascular: Regular rate and rhythm, no bilateral edema      Assessment & Plan:  Viral URI  Cough, postnasal drip related to above Sinusitis due to above, but chronic allergic sinus symptoms reviewed   Explained lack of efficacy for antibiotics in viral disease  Empiric antibiotics prescribed -will start IF symptom duration greater than 7 days, fever or worsening sinus symptoms given hx same  Prescription cough suppression - new prescriptions done  Symptomatic care with Tylenol or Advil, hydration and rest -   salt gargle advised as needed

## 2013-03-14 NOTE — Patient Instructions (Addendum)
It was good to see you today.  If you develop worsening symptoms or fever, we can reconsider antibiotics, but it does not appear necessary to use antibiotics at this time. However, a paper prescription for 7 days Levaquin given to you to fill and use if needed  Use codeine prescription cough and congestion syrup - especially at night  Your prescription(s) have been given to you to submit to your pharmacy. Please take as directed and contact our office if you believe you are having problem(s) with the medication(s).  Alternate between ibuprofen and tylenol for aches, pain and fever symptoms as discussed  Hydrate, rest and call if worse or unimproved  Upper Respiratory Infection, Adult An upper respiratory infection (URI) is also sometimes known as the common cold. The upper respiratory tract includes the nose, sinuses, throat, trachea, and bronchi. Bronchi are the airways leading to the lungs. Most people improve within 1 week, but symptoms can last up to 2 weeks. A residual cough may last even longer.  CAUSES Many different viruses can infect the tissues lining the upper respiratory tract. The tissues become irritated and inflamed and often become very moist. Mucus production is also common. A cold is contagious. You can easily spread the virus to others by oral contact. This includes kissing, sharing a glass, coughing, or sneezing. Touching your mouth or nose and then touching a surface, which is then touched by another person, can also spread the virus. SYMPTOMS  Symptoms typically develop 1 to 3 days after you come in contact with a cold virus. Symptoms vary from person to person. They may include:  Runny nose.  Sneezing.  Nasal congestion.  Sinus irritation.  Sore throat.  Loss of voice (laryngitis).  Cough.  Fatigue.  Muscle aches.  Loss of appetite.  Headache.  Low-grade fever. DIAGNOSIS  You might diagnose your own cold based on familiar symptoms, since most people  get a cold 2 to 3 times a year. Your caregiver can confirm this based on your exam. Most importantly, your caregiver can check that your symptoms are not due to another disease such as strep throat, sinusitis, pneumonia, asthma, or epiglottitis. Blood tests, throat tests, and X-rays are not necessary to diagnose a common cold, but they may sometimes be helpful in excluding other more serious diseases. Your caregiver will decide if any further tests are required. RISKS AND COMPLICATIONS  You may be at risk for a more severe case of the common cold if you smoke cigarettes, have chronic heart disease (such as heart failure) or lung disease (such as asthma), or if you have a weakened immune system. The very young and very old are also at risk for more serious infections. Bacterial sinusitis, middle ear infections, and bacterial pneumonia can complicate the common cold. The common cold can worsen asthma and chronic obstructive pulmonary disease (COPD). Sometimes, these complications can require emergency medical care and may be life-threatening. PREVENTION  The best way to protect against getting a cold is to practice good hygiene. Avoid oral or hand contact with people with cold symptoms. Wash your hands often if contact occurs. There is no clear evidence that vitamin C, vitamin E, echinacea, or exercise reduces the chance of developing a cold. However, it is always recommended to get plenty of rest and practice good nutrition. TREATMENT  Treatment is directed at relieving symptoms. There is no cure. Antibiotics are not effective, because the infection is caused by a virus, not by bacteria. Treatment may include:  Increased fluid  intake. Sports drinks offer valuable electrolytes, sugars, and fluids.  Breathing heated mist or steam (vaporizer or shower).  Eating chicken soup or other clear broths, and maintaining good nutrition.  Getting plenty of rest.  Using gargles or lozenges for  comfort.  Controlling fevers with ibuprofen or acetaminophen as directed by your caregiver.  Increasing usage of your inhaler if you have asthma. Zinc gel and zinc lozenges, taken in the first 24 hours of the common cold, can shorten the duration and lessen the severity of symptoms. Pain medicines may help with fever, muscle aches, and throat pain. A variety of non-prescription medicines are available to treat congestion and runny nose. Your caregiver can make recommendations and may suggest nasal or lung inhalers for other symptoms.  HOME CARE INSTRUCTIONS   Only take over-the-counter or prescription medicines for pain, discomfort, or fever as directed by your caregiver.  Use a warm mist humidifier or inhale steam from a shower to increase air moisture. This may keep secretions moist and make it easier to breathe.  Drink enough water and fluids to keep your urine clear or pale yellow.  Rest as needed.  Return to work when your temperature has returned to normal or as your caregiver advises. You may need to stay home longer to avoid infecting others. You can also use a face mask and careful hand washing to prevent spread of the virus. SEEK MEDICAL CARE IF:   After the first few days, you feel you are getting worse rather than better.  You need your caregiver's advice about medicines to control symptoms.  You develop chills, worsening shortness of breath, or brown or red sputum. These may be signs of pneumonia.  You develop yellow or brown nasal discharge or pain in the face, especially when you bend forward. These may be signs of sinusitis.  You develop a fever, swollen neck glands, pain with swallowing, or white areas in the back of your throat. These may be signs of strep throat. SEEK IMMEDIATE MEDICAL CARE IF:   You have a fever.  You develop severe or persistent headache, ear pain, sinus pain, or chest pain.  You develop wheezing, a prolonged cough, cough up blood, or have a  change in your usual mucus (if you have chronic lung disease).  You develop sore muscles or a stiff neck. Document Released: 09/01/2000 Document Revised: 05/31/2011 Document Reviewed: 07/10/2010 [facility] Patient Information 2014 [place], [place].

## 2013-04-03 ENCOUNTER — Encounter: Payer: Self-pay | Admitting: Family Medicine

## 2013-05-29 ENCOUNTER — Other Ambulatory Visit: Payer: Self-pay | Admitting: Family Medicine

## 2013-05-30 NOTE — Telephone Encounter (Signed)
Med filled.  

## 2013-06-06 ENCOUNTER — Encounter: Payer: Self-pay | Admitting: Family Medicine

## 2013-06-07 MED ORDER — TOPIRAMATE 50 MG PO TABS: 50.0000 mg | ORAL_TABLET | Freq: Two times a day (BID) | ORAL | Status: DC

## 2013-06-07 NOTE — Telephone Encounter (Signed)
Med filed.

## 2013-08-02 ENCOUNTER — Other Ambulatory Visit: Payer: Self-pay | Admitting: Family Medicine

## 2013-08-02 ENCOUNTER — Encounter: Payer: Self-pay | Admitting: Family Medicine

## 2013-08-02 MED ORDER — TIZANIDINE HCL 4 MG PO TABS: 4.0000 mg | ORAL_TABLET | Freq: Three times a day (TID) | ORAL | Status: DC | PRN

## 2013-08-02 MED ORDER — HYDROCODONE-ACETAMINOPHEN 7.5-325 MG PO TABS: 1.0000 | ORAL_TABLET | Freq: Three times a day (TID) | ORAL | Status: DC | PRN

## 2013-08-10 ENCOUNTER — Encounter: Payer: Self-pay | Admitting: Family Medicine

## 2013-08-10 DIAGNOSIS — D241 Benign neoplasm of right breast: Secondary | ICD-10-CM

## 2013-08-10 NOTE — Telephone Encounter (Signed)
Order placed

## 2013-08-20 LAB — HM MAMMOGRAPHY: HM MAMMO: NORMAL

## 2013-08-22 ENCOUNTER — Encounter: Payer: Self-pay | Admitting: General Practice

## 2013-09-13 ENCOUNTER — Other Ambulatory Visit: Payer: Self-pay | Admitting: Family Medicine

## 2013-09-13 LAB — HM DEXA SCAN

## 2013-09-14 NOTE — Telephone Encounter (Signed)
Med filled.  

## 2013-09-18 ENCOUNTER — Encounter: Payer: Self-pay | Admitting: General Practice

## 2013-09-19 ENCOUNTER — Encounter: Payer: Self-pay | Admitting: General Practice

## 2013-09-19 ENCOUNTER — Other Ambulatory Visit: Payer: Self-pay | Admitting: General Practice

## 2013-09-19 MED ORDER — ALENDRONATE SODIUM 70 MG PO TABS: 70.0000 mg | ORAL_TABLET | ORAL | Status: DC

## 2013-09-28 ENCOUNTER — Encounter: Payer: Self-pay | Admitting: Family Medicine

## 2013-10-03 ENCOUNTER — Encounter: Payer: Self-pay | Admitting: Family Medicine

## 2013-10-04 ENCOUNTER — Other Ambulatory Visit (HOSPITAL_COMMUNITY): Payer: Self-pay

## 2013-10-05 ENCOUNTER — Encounter (HOSPITAL_COMMUNITY)
Admission: RE | Admit: 2013-10-05 | Discharge: 2013-10-05 | Disposition: A | Discharge status: Home / Self Care | Payer: 59 | Source: Ambulatory Visit | Attending: Rheumatology | Admitting: Rheumatology

## 2013-10-05 DIAGNOSIS — M810 Age-related osteoporosis without current pathological fracture: Secondary | ICD-10-CM | POA: Insufficient documentation

## 2013-10-05 MED ORDER — ZOLEDRONIC ACID 5 MG/100ML IV SOLN: 5.0000 mg | Freq: Once | INTRAVENOUS | Status: DC

## 2013-10-05 MED ORDER — ZOLEDRONIC ACID 5 MG/100ML IV SOLN
INTRAVENOUS | Status: AC
  Administered 2013-10-05: 5 mg
  Filled 2013-10-05: qty 100

## 2013-10-08 ENCOUNTER — Encounter: Payer: Self-pay | Admitting: Family Medicine

## 2013-10-08 ENCOUNTER — Other Ambulatory Visit: Payer: Self-pay | Admitting: General Practice

## 2013-10-08 ENCOUNTER — Ambulatory Visit (INDEPENDENT_AMBULATORY_CARE_PROVIDER_SITE_OTHER): Payer: 59 | Admitting: Family Medicine

## 2013-10-08 VITALS — BP 130/82 | HR 85 | Temp 98.0°F | Resp 16 | Wt 182.2 lb

## 2013-10-08 DIAGNOSIS — D6489 Other specified anemias: Secondary | ICD-10-CM

## 2013-10-08 LAB — CBC WITH DIFFERENTIAL/PLATELET
BASOS ABS: 0 10*3/uL (ref 0.0–0.1)
Basophils Relative: 0.4 % (ref 0.0–3.0)
Eosinophils Absolute: 0.1 10*3/uL (ref 0.0–0.7)
Eosinophils Relative: 2 % (ref 0.0–5.0)
HCT: 30.7 % — ABNORMAL LOW (ref 36.0–46.0)
HEMOGLOBIN: 9.8 g/dL — AB (ref 12.0–15.0)
LYMPHS PCT: 25.5 % (ref 12.0–46.0)
Lymphs Abs: 1.4 10*3/uL (ref 0.7–4.0)
MCHC: 31.8 g/dL (ref 30.0–36.0)
MCV: 77.1 fl — ABNORMAL LOW (ref 78.0–100.0)
Monocytes Absolute: 0.5 10*3/uL (ref 0.1–1.0)
Monocytes Relative: 9.9 % (ref 3.0–12.0)
NEUTROS ABS: 3.3 10*3/uL (ref 1.4–7.7)
Neutrophils Relative %: 62.2 % (ref 43.0–77.0)
PLATELETS: 291 10*3/uL (ref 150.0–400.0)
RBC: 3.99 Mil/uL (ref 3.87–5.11)
RDW: 16.4 % — AB (ref 11.5–15.5)
WBC: 5.3 10*3/uL (ref 4.0–10.5)

## 2013-10-08 LAB — IBC PANEL
IRON: 24 ug/dL — AB (ref 42–145)
Saturation Ratios: 4.4 % — ABNORMAL LOW (ref 20.0–50.0)
Transferrin: 388.1 mg/dL — ABNORMAL HIGH (ref 212.0–360.0)

## 2013-10-08 LAB — B12 AND FOLATE PANEL: Folate: >24.8 ng/mL (ref >5.9)

## 2013-10-08 MED ORDER — TANDEM PLUS 162-115.2-1 MG PO CAPS: 1.0000 | ORAL_CAPSULE | Freq: Every day | ORAL | Status: DC

## 2013-10-08 NOTE — Progress Notes (Signed)
Pre visit review using our clinic review tool, if applicable. No additional management support is needed unless otherwise documented below in the visit note. 

## 2013-10-08 NOTE — Patient Instructions (Signed)
Follow up as scheduled Drink plenty of fluids We'll notify you of your lab results and if this is iron deficiency, we'll refer you to GI for evaluation and start the Tandem Plus Continue to rest as needed Call with any questions or concerns Hang in there!!!

## 2013-10-08 NOTE — Progress Notes (Signed)
   Subjective:    Patient ID: Melissa [name], female    DOB: 10-Feb-1966, 48 y.o.   MRN: [id number]  Anemia  Headache    Anemia- pt had labs done at Dr Arlean Hopping office that showed she was anemic.  Pt reports having daily late afternoon HAs.  HAs worsened after Reclast IV on Friday.  Ongoing fatigue- would fall asleep at desk while working from home.  S/p hysterectomy.  No rectal bleeding that she has seen.  Is s/p gastric bypass.  Taking B complex vitamin.   Review of Systems  Neurological: Positive for headaches.   For ROS see HPI     Objective:   Physical Exam  Vitals reviewed. Constitutional: She is oriented to person, place, and time. She appears well-developed and well-nourished. No distress.  HENT:  Head: Normocephalic and atraumatic.  Eyes: Conjunctivae and EOM are normal. Pupils are equal, round, and reactive to light.  Neck: Normal range of motion. Neck supple. No thyromegaly present.  Cardiovascular: Normal rate, regular rhythm, normal heart sounds and intact distal pulses.   No murmur heard. Pulmonary/Chest: Effort normal and breath sounds normal. No respiratory distress.  Abdominal: Soft. She exhibits no distension. There is no tenderness.  Musculoskeletal: She exhibits no edema.  Lymphadenopathy:    She has no cervical adenopathy.  Neurological: She is alert and oriented to person, place, and time.  Skin: Skin is warm and dry.  Psychiatric: She has a normal mood and affect. Her behavior is normal.          Assessment & Plan:

## 2013-10-09 NOTE — Assessment & Plan Note (Signed)
Chronic problem.  Pt has hx of gastric bypass so it is unclear if this is iron deficiency or B12/folate deficiency.  Check labs.  Tx based on lab results

## 2013-10-12 ENCOUNTER — Encounter (HOSPITAL_COMMUNITY): Payer: 59

## 2013-10-14 ENCOUNTER — Other Ambulatory Visit: Payer: Self-pay | Admitting: Family Medicine

## 2013-10-15 ENCOUNTER — Encounter: Payer: Self-pay | Admitting: Family Medicine

## 2013-10-15 NOTE — Telephone Encounter (Signed)
Med filled.  

## 2013-10-16 DIAGNOSIS — R12 Heartburn: Secondary | ICD-10-CM | POA: Insufficient documentation

## 2013-10-16 DIAGNOSIS — M810 Age-related osteoporosis without current pathological fracture: Secondary | ICD-10-CM | POA: Insufficient documentation

## 2013-10-16 DIAGNOSIS — Z9884 Bariatric surgery status: Secondary | ICD-10-CM | POA: Insufficient documentation

## 2013-11-13 ENCOUNTER — Other Ambulatory Visit: Payer: Self-pay | Admitting: Family Medicine

## 2013-11-13 NOTE — Telephone Encounter (Signed)
Med filled.  

## 2014-01-19 ENCOUNTER — Other Ambulatory Visit: Payer: Self-pay | Admitting: Family Medicine

## 2014-02-12 ENCOUNTER — Other Ambulatory Visit (HOSPITAL_COMMUNITY): Payer: Self-pay | Admitting: Orthopaedic Surgery

## 2014-02-12 DIAGNOSIS — M25512 Pain in left shoulder: Secondary | ICD-10-CM

## 2014-02-28 ENCOUNTER — Ambulatory Visit (HOSPITAL_COMMUNITY): Payer: 59

## 2015-03-13 DIAGNOSIS — G43019 Migraine without aura, intractable, without status migrainosus: Secondary | ICD-10-CM | POA: Insufficient documentation

## 2015-03-27 DIAGNOSIS — E559 Vitamin D deficiency, unspecified: Secondary | ICD-10-CM | POA: Insufficient documentation

## 2015-06-05 ENCOUNTER — Other Ambulatory Visit: Payer: Self-pay | Admitting: Orthopaedic Surgery

## 2015-06-05 DIAGNOSIS — M545 Low back pain: Secondary | ICD-10-CM

## 2015-06-10 ENCOUNTER — Emergency Department (HOSPITAL_COMMUNITY): Payer: 59

## 2015-06-10 ENCOUNTER — Emergency Department (HOSPITAL_COMMUNITY)
Admission: EM | Admit: 2015-06-10 | Discharge: 2015-06-10 | Disposition: A | Discharge status: Home / Self Care | Payer: 59 | Attending: Emergency Medicine | Admitting: Emergency Medicine

## 2015-06-10 ENCOUNTER — Encounter (HOSPITAL_COMMUNITY): Payer: Self-pay | Admitting: Emergency Medicine

## 2015-06-10 DIAGNOSIS — R079 Chest pain, unspecified: Secondary | ICD-10-CM | POA: Insufficient documentation

## 2015-06-10 DIAGNOSIS — R55 Syncope and collapse: Secondary | ICD-10-CM | POA: Insufficient documentation

## 2015-06-10 DIAGNOSIS — R42 Dizziness and giddiness: Secondary | ICD-10-CM | POA: Diagnosis not present

## 2015-06-10 DIAGNOSIS — J45909 Unspecified asthma, uncomplicated: Secondary | ICD-10-CM | POA: Diagnosis not present

## 2015-06-10 LAB — I-STAT TROPONIN, ED: TROPONIN I, POC: 0 ng/mL (ref 0.00–0.08)

## 2015-06-10 LAB — BASIC METABOLIC PANEL
Anion gap: 12 (ref 5–15)
BUN: 8 mg/dL (ref 6–20)
CALCIUM: 9.3 mg/dL (ref 8.9–10.3)
CO2: 25 mmol/L (ref 22–32)
CREATININE: 0.54 mg/dL (ref 0.44–1.00)
Chloride: 105 mmol/L (ref 101–111)
Glucose, Bld: 91 mg/dL (ref 65–99)
Potassium: 4.3 mmol/L (ref 3.5–5.1)
SODIUM: 142 mmol/L (ref 135–145)

## 2015-06-10 LAB — CBC
HCT: 38.7 % (ref 36.0–46.0)
Hemoglobin: 12.9 g/dL (ref 12.0–15.0)
MCH: 29 pg (ref 26.0–34.0)
MCHC: 33.3 g/dL (ref 30.0–36.0)
MCV: 87 fL (ref 78.0–100.0)
PLATELETS: 300 10*3/uL (ref 150–400)
RBC: 4.45 MIL/uL (ref 3.87–5.11)
RDW: 13.9 % (ref 11.5–15.5)
WBC: 7.9 10*3/uL (ref 4.0–10.5)

## 2015-06-10 NOTE — ED Notes (Signed)
Pt reports that she had an episode at 1245 where she became lightheaded and began to have centralized non radiating CP. Pt reports during the episode she felt like she was going to pass out but did not. Pt alert x4. NAD at this time. Pt denies CP at this time.

## 2015-06-13 ENCOUNTER — Ambulatory Visit
Admission: RE | Admit: 2015-06-13 | Discharge: 2015-06-13 | Disposition: A | Discharge status: Home / Self Care | Payer: 59 | Source: Ambulatory Visit | Attending: Orthopaedic Surgery | Admitting: Orthopaedic Surgery

## 2015-06-13 DIAGNOSIS — M545 Low back pain: Secondary | ICD-10-CM

## 2015-07-11 DIAGNOSIS — G43901 Migraine, unspecified, not intractable, with status migrainosus: Secondary | ICD-10-CM | POA: Insufficient documentation

## 2015-08-01 ENCOUNTER — Encounter: Payer: Self-pay | Admitting: Gastroenterology

## 2015-08-28 DIAGNOSIS — E669 Obesity, unspecified: Secondary | ICD-10-CM | POA: Insufficient documentation

## 2015-08-28 DIAGNOSIS — M797 Fibromyalgia: Secondary | ICD-10-CM | POA: Insufficient documentation

## 2015-10-27 ENCOUNTER — Encounter (HOSPITAL_COMMUNITY): Payer: Self-pay | Admitting: Emergency Medicine

## 2015-10-27 ENCOUNTER — Emergency Department (HOSPITAL_COMMUNITY)
Admission: EM | Admit: 2015-10-27 | Discharge: 2015-10-27 | Disposition: A | Discharge status: Home / Self Care | Payer: 59 | Attending: Emergency Medicine | Admitting: Emergency Medicine

## 2015-10-27 DIAGNOSIS — Z79899 Other long term (current) drug therapy: Secondary | ICD-10-CM | POA: Diagnosis not present

## 2015-10-27 DIAGNOSIS — J45909 Unspecified asthma, uncomplicated: Secondary | ICD-10-CM | POA: Insufficient documentation

## 2015-10-27 DIAGNOSIS — G43909 Migraine, unspecified, not intractable, without status migrainosus: Secondary | ICD-10-CM | POA: Diagnosis present

## 2015-10-27 DIAGNOSIS — G43901 Migraine, unspecified, not intractable, with status migrainosus: Secondary | ICD-10-CM | POA: Insufficient documentation

## 2015-10-27 DIAGNOSIS — Z87891 Personal history of nicotine dependence: Secondary | ICD-10-CM | POA: Diagnosis not present

## 2015-10-27 MED ORDER — VALPROATE SODIUM 500 MG/5ML IV SOLN: 500.0000 mg | Freq: Once | INTRAVENOUS | Status: DC

## 2015-10-27 MED ORDER — METOCLOPRAMIDE HCL 5 MG/ML IJ SOLN
10.0000 mg | Freq: Once | INTRAMUSCULAR | Status: AC
Start: 2015-10-27 — End: 2015-10-27
  Administered 2015-10-27: 10 mg via INTRAVENOUS
  Filled 2015-10-27: qty 2

## 2015-10-27 MED ORDER — VALPROATE SODIUM 500 MG/5ML IV SOLN
500.0000 mg | Freq: Once | INTRAVENOUS | Status: AC
  Administered 2015-10-27: 500 mg via INTRAVENOUS
  Filled 2015-10-27: qty 5

## 2015-10-27 MED ORDER — SODIUM CHLORIDE 0.9 % IV BOLUS (SEPSIS)
1000.0000 mL | Freq: Once | INTRAVENOUS | Status: AC
  Administered 2015-10-27: 1000 mL via INTRAVENOUS

## 2015-10-27 MED ORDER — DIPHENHYDRAMINE HCL 50 MG/ML IJ SOLN
25.0000 mg | Freq: Once | INTRAMUSCULAR | Status: AC
  Administered 2015-10-27: 25 mg via INTRAVENOUS
  Filled 2015-10-27: qty 1

## 2015-10-27 NOTE — ED Provider Notes (Signed)
[facility] DEPT Provider Note   CSN: [id number] Arrival date & time: 10/27/15  2032  By signing my name below, I, Gwenlyn Fudge, attest that this documentation has been prepared under the direction and in the presence of Junius Creamer, NP. Electronically Signed: Gwenlyn Fudge, ED Scribe. 10/27/15. 10:07 PM.   First MD Initiated Contact with Patient 10/27/15 2207      History   Chief Complaint Chief Complaint  Patient presents with  . Migraine   The history is provided by the patient. No language interpreter was used.    HPI Comments: Melissa [name] is a 50 y.o. female with PMHx of Migraines who presents to the Emergency Department complaining of chronic, episodic, gradual worsening migraines onset Saturday. Pt states she has had migraines since she was 60 and they were well controlled until 18 months  ago. She states she has had trouble sleeping and has not slept in the last 24 hours. Pt states she was seen at   [facility] Friday with no changes to medications, she called today to arranged an appointment for 10/28/15. Pt was told if she was still having trouble with her migraines, she could be seen at an ED to get an "infusion" for her headache. Pt states the last infusion relieved her symptoms. Pt states she has tried nerve blocks, SPG block, and Botox injections to relieve her migraines with little no no relief Pt denies nausea and vomiting.   Past Medical History:  Diagnosis Date  . ADD (attention deficit disorder)   . Allergy   . Arthritis   . Asthma   . Depression   . Migraine   . Osteoporosis   . RLS (restless legs syndrome)     Patient Active Problem List   Diagnosis Date Noted  . Hair loss 08/28/2012  . Disordered sleep 08/28/2012  . S/P gastric bypass 06/21/2012  . Routine general medical examination at a health care facility 06/21/2012  . Insomnia 06/21/2012  . Anemia 01/12/2012  . MENOPAUSE-RELATED VASOMOTOR SYMPTOMS, HOT FLASHES 12/07/2007  . RESTLESS LEG  SYNDROME 10/26/2007  . ALLERGIC RHINITIS 12/23/2006  . HEADACHE 12/23/2006  . ALLERGIC RHINITIS, SEASONAL 11/04/2006  . SYMPTOM, DYSFUNCTION, SLEEP STAGE 11/04/2006    Past Surgical History:  Procedure Laterality Date  . ABDOMINAL HYSTERECTOMY    . CHOLECYSTECTOMY    . FOOT SURGERY    . GASTRIC BYPASS    . TOTAL KNEE ARTHROPLASTY    . TUBAL LIGATION      OB History    No data available       Home Medications    Prior to Admission medications   Medication Sig Start Date End Date Taking? Authorizing Provider  albuterol (VENTOLIN HFA) 108 (90 Base) MCG/ACT inhaler INHALE 2 PUFFS INTO THE LUNGS EVERY 6 HOURS AS NEEDED FOR WHEEZING 07/09/15  Yes Historical Provider, MD  amitriptyline (ELAVIL) 25 MG tablet Take 25 mg by mouth at bedtime.  10/19/15  Yes Historical Provider, MD  calcium citrate (CALCITRATE - DOSED IN MG ELEMENTAL CALCIUM) 950 MG tablet Take 2 tablets by mouth daily.   Yes Historical Provider, MD  cetirizine (ZYRTEC) 10 MG tablet Take 10 mg by mouth daily.     Yes Historical Provider, MD  Cholecalciferol (VITAMIN D3) 50000 units CAPS TK 1 C PO Q WK 08/26/15  Yes Historical Provider, MD  docusate sodium (DOK) 100 MG capsule TK 1 C PO QD PRN FOR CONSTIPATION 08/22/15  Yes Historical Provider, MD  Estradiol Acetate (Addison) 0.05 MG/24HR  RING INSERT 1 RING INTRAVAGINALLY EVERY 3 MONTHS 06/23/15  Yes Historical Provider, MD  ferrous gluconate (FERGON) 325 MG tablet TK 1 T PO ONCE A DAY UTD 08/22/15  Yes Historical Provider, MD  gabapentin (NEURONTIN) 300 MG capsule Take 300 mg by mouth 3 (three) times daily.  10/07/15  Yes Historical Provider, MD  methocarbamol (ROBAXIN) 500 MG tablet Take 1 tablet by mouth at bedtime as needed for muscle spasms.  04/01/12  Yes Historical Provider, MD  Multiple Vitamin (MULTIVITAMIN) tablet Take 1 tablet by mouth daily.     Yes Historical Provider, MD  pramipexole (MIRAPEX) 1 MG tablet Take 1 tablet by mouth  twice a day 05/28/15  Yes Historical Provider,  MD  sertraline (ZOLOFT) 50 MG tablet Take 50 mg by mouth at bedtime.  10/19/15  Yes Historical Provider, MD  SUMAtriptan (IMITREX) 20 MG/ACT nasal spray Place 20 mg into the nose every 2 (two) hours as needed for migraine.  10/09/15  Yes Historical Provider, MD  tiZANidine (ZANAFLEX) 2 MG tablet Take 2 mg by mouth every 6 (six) hours as needed (headache).  10/11/15  Yes Historical Provider, MD  traMADol (ULTRAM) 50 MG tablet Take 100 mg by mouth every 6 (six) hours as needed (headache).  10/19/15  Yes Historical Provider, MD  albuterol (PROAIR HFA) 108 (90 BASE) MCG/ACT inhaler Inhale 2 puffs into the lungs every 6 (six) hours as needed. 11/26/10   Dorena Cookey, MD  alendronate (FOSAMAX) 70 MG tablet Take 1 tablet (70 mg total) by mouth every 7 (seven) days. Take with a full glass of water on an empty stomach. Patient not taking: Reported on 10/27/2015 09/19/13   Midge Minium, MD  FeFum-FePo-FA-B Cmp-C-Zn-Mn-Cu (TANDEM PLUS) 162-115.2-1 MG CAPS Take 1 capsule by mouth daily. Patient not taking: Reported on 10/27/2015 10/08/13   Midge Minium, MD  HYDROcodone-acetaminophen (NORCO) 7.5-325 MG per tablet Take 1 tablet by mouth every 8 (eight) hours as needed. Patient not taking: Reported on 10/27/2015 08/02/13   Midge Minium, MD  pramipexole (MIRAPEX) 1 MG tablet TAKE 1 TABLET BY MOUTH TWICE DAILY Patient not taking: Reported on 10/27/2015 11/13/13   Midge Minium, MD  rizatriptan (MAXALT) 10 MG tablet Take 10 mg by mouth. 07/11/15   Historical Provider, MD  tiZANidine (ZANAFLEX) 4 MG tablet Take 1 tablet (4 mg total) by mouth every 8 (eight) hours as needed. Patient not taking: Reported on 10/27/2015 08/02/13   Midge Minium, MD  topiramate (TOPAMAX) 50 MG tablet Take 1 tablet (50 mg total) by mouth 2 (two) times daily. Patient not taking: Reported on 10/27/2015 06/07/13   Midge Minium, MD  zolmitriptan (ZOMIG-ZMT) 5 MG disintegrating tablet TAKE 1 TABLET BY MOUTH AS NEEDED FOR  MIGRAINE Patient not taking: Reported on 10/27/2015 01/21/14   Midge Minium, MD    Family History Family History  Problem Relation Age of Onset  . Cancer Mother     breast  . Cancer Sister     breast  . Cancer Maternal Aunt     breast  . Cancer Maternal Grandmother     breast  . Cancer Maternal Aunt     breast  . Depression      fhx    Social History Social History  Substance Use Topics  . Smoking status: Former Research scientist (life sciences)  . Smokeless tobacco: Never Used  . Alcohol use No     Comment: socially     Allergies   Other  Review of Systems Review of Systems  Eyes: Positive for photophobia.  Musculoskeletal: Positive for neck pain.  Neurological: Positive for headaches. Negative for weakness.  All other systems reviewed and are negative.    Physical Exam Updated Vital Signs BP 121/62 (BP Location: Right Arm)   Pulse 70   Temp 98.4 F (36.9 C) (Oral)   Resp 18   Ht 5\' 7"  (1.702 m)   Wt 220 lb (99.8 kg)   SpO2 96%   BMI 34.46 kg/m   Physical Exam  Constitutional: She appears well-developed and well-nourished.  HENT:  Head: Normocephalic.  Eyes: EOM are normal. Pupils are equal, round, and reactive to light.  Neck: Normal range of motion.  Cardiovascular: Normal rate.   Pulmonary/Chest: Effort normal.  Musculoskeletal: Normal range of motion.  Neurological: She is alert.  Skin: Skin is warm.  Nursing note and vitals reviewed.    ED Treatments / Results  DIAGNOSTIC STUDIES: Oxygen Saturation is 96% on RA, adequate by my interpretation.    COORDINATION OF CARE: 10:06 PM Discussed treatment plan with pt at bedside which includes her headache cocktail of Depacon, Reglan and [place] pt agreed to plan.  Labs (all labs ordered are listed, but only abnormal results are displayed) Labs Reviewed - No data to display  EKG  EKG Interpretation None       Radiology No results found.  Procedures Procedures (including critical care  time)  Medications Ordered in ED Medications - No data to display   Initial Impression / Assessment and Plan / ED Course  I have reviewed the triage vital signs and the nursing notes.  Pertinent labs & imaging results that were available during my care of the patient were reviewed by me and considered in my medical decision making (see chart for details).  Clinical Course  Patient headache cocktail of choice that works for her id Depacon, Reglan and Benadryl  Headache considerable better asking to be DC home she will FU as scheduled with Neurologist    Final Clinical Impressions(s) / ED Diagnoses   Final diagnoses:  None    New Prescriptions New Prescriptions   No medications on file   I personally performed the services described in this documentation, which was scribed in my presence. The recorded information has been reviewed and is accurate.    Junius Creamer, NP 10/27/15 [place], NP 10/27/15 [place], NP 10/27/15 2346    Julianne Rice, MD 10/28/15 2302

## 2015-10-27 NOTE — ED Notes (Signed)
Patient was alert, oriented and stable upon discharge. RN went over AVS and patient had no further questions.  

## 2015-10-27 NOTE — ED Triage Notes (Signed)
Patient complaining of migraine that started back on Saturday. Patient states that she has not been able to sleep in the last 24 hours. No nausea or vomiting.

## 2015-10-27 NOTE — Discharge Instructions (Signed)
Keep you appoiontment as scheduled with your neurologist at [facility] as scheduled

## 2015-11-12 ENCOUNTER — Other Ambulatory Visit (HOSPITAL_COMMUNITY): Payer: Self-pay | Admitting: *Deleted

## 2015-11-13 ENCOUNTER — Encounter (HOSPITAL_COMMUNITY): Payer: Self-pay

## 2015-11-13 ENCOUNTER — Encounter (HOSPITAL_COMMUNITY): Payer: 59

## 2015-11-13 ENCOUNTER — Encounter (HOSPITAL_COMMUNITY)
Admission: RE | Admit: 2015-11-13 | Discharge: 2015-11-13 | Disposition: A | Discharge status: Home / Self Care | Payer: 59 | Source: Ambulatory Visit | Attending: Rheumatology | Admitting: Rheumatology

## 2015-11-13 ENCOUNTER — Encounter (HOSPITAL_COMMUNITY)
Admission: RE | Admit: 2015-11-13 | Discharge: 2015-11-13 | Disposition: A | Discharge status: Home / Self Care | Payer: 59 | Source: Ambulatory Visit | Attending: Family Medicine | Admitting: Family Medicine

## 2015-11-13 DIAGNOSIS — M810 Age-related osteoporosis without current pathological fracture: Secondary | ICD-10-CM | POA: Diagnosis not present

## 2015-11-13 DIAGNOSIS — D509 Iron deficiency anemia, unspecified: Secondary | ICD-10-CM | POA: Diagnosis present

## 2015-11-13 MED ORDER — DIPHENHYDRAMINE HCL 50 MG/ML IJ SOLN
25.0000 mg | Freq: Once | INTRAMUSCULAR | Status: AC
  Administered 2015-11-13: 25 mg via INTRAVENOUS

## 2015-11-13 MED ORDER — DIPHENHYDRAMINE HCL 50 MG/ML IJ SOLN
INTRAMUSCULAR | Status: AC
  Administered 2015-11-13: 25 mg via INTRAVENOUS
  Filled 2015-11-13: qty 1

## 2015-11-13 MED ORDER — ZOLEDRONIC ACID 5 MG/100ML IV SOLN
INTRAVENOUS | Status: AC
  Filled 2015-11-13: qty 100

## 2015-11-13 MED ORDER — SODIUM CHLORIDE 0.9 % IV SOLN
500.0000 mg | INTRAVENOUS | Status: AC
  Administered 2015-11-13: 500 mg via INTRAVENOUS
  Filled 2015-11-13: qty 25

## 2015-11-13 MED ORDER — ZOLEDRONIC ACID 5 MG/100ML IV SOLN
5.0000 mg | Freq: Once | INTRAVENOUS | Status: AC
  Administered 2015-11-13: 5 mg via INTRAVENOUS

## 2015-11-14 ENCOUNTER — Encounter (HOSPITAL_COMMUNITY): Payer: 59

## 2016-02-03 ENCOUNTER — Ambulatory Visit: Payer: Self-pay | Admitting: Rheumatology

## 2016-03-26 DIAGNOSIS — G43719 Chronic migraine without aura, intractable, without status migrainosus: Secondary | ICD-10-CM | POA: Diagnosis not present

## 2016-03-26 DIAGNOSIS — G43711 Chronic migraine without aura, intractable, with status migrainosus: Secondary | ICD-10-CM | POA: Diagnosis not present

## 2016-03-26 DIAGNOSIS — M797 Fibromyalgia: Secondary | ICD-10-CM | POA: Diagnosis not present

## 2016-03-26 DIAGNOSIS — G43019 Migraine without aura, intractable, without status migrainosus: Secondary | ICD-10-CM | POA: Diagnosis not present

## 2016-03-26 DIAGNOSIS — Z79899 Other long term (current) drug therapy: Secondary | ICD-10-CM | POA: Diagnosis not present

## 2016-03-31 DIAGNOSIS — M797 Fibromyalgia: Secondary | ICD-10-CM | POA: Diagnosis not present

## 2016-03-31 DIAGNOSIS — G43711 Chronic migraine without aura, intractable, with status migrainosus: Secondary | ICD-10-CM | POA: Diagnosis not present

## 2016-04-07 DIAGNOSIS — R51 Headache: Secondary | ICD-10-CM | POA: Diagnosis not present

## 2016-04-19 DIAGNOSIS — Z1231 Encounter for screening mammogram for malignant neoplasm of breast: Secondary | ICD-10-CM | POA: Diagnosis not present

## 2016-04-23 DIAGNOSIS — G43911 Migraine, unspecified, intractable, with status migrainosus: Secondary | ICD-10-CM | POA: Diagnosis not present

## 2016-05-19 DIAGNOSIS — E538 Deficiency of other specified B group vitamins: Secondary | ICD-10-CM | POA: Diagnosis not present

## 2016-05-19 DIAGNOSIS — E611 Iron deficiency: Secondary | ICD-10-CM | POA: Diagnosis not present

## 2016-05-19 DIAGNOSIS — R768 Other specified abnormal immunological findings in serum: Secondary | ICD-10-CM | POA: Diagnosis not present

## 2016-05-19 DIAGNOSIS — M797 Fibromyalgia: Secondary | ICD-10-CM | POA: Diagnosis not present

## 2016-05-19 DIAGNOSIS — E559 Vitamin D deficiency, unspecified: Secondary | ICD-10-CM | POA: Diagnosis not present

## 2016-06-14 DIAGNOSIS — G43709 Chronic migraine without aura, not intractable, without status migrainosus: Secondary | ICD-10-CM | POA: Diagnosis not present

## 2016-06-14 DIAGNOSIS — M797 Fibromyalgia: Secondary | ICD-10-CM | POA: Diagnosis not present

## 2016-06-16 DIAGNOSIS — G43709 Chronic migraine without aura, not intractable, without status migrainosus: Secondary | ICD-10-CM | POA: Diagnosis not present

## 2016-06-28 DIAGNOSIS — G43019 Migraine without aura, intractable, without status migrainosus: Secondary | ICD-10-CM | POA: Diagnosis not present

## 2016-06-28 DIAGNOSIS — G43719 Chronic migraine without aura, intractable, without status migrainosus: Secondary | ICD-10-CM | POA: Diagnosis not present

## 2016-06-28 DIAGNOSIS — Z79899 Other long term (current) drug therapy: Secondary | ICD-10-CM | POA: Diagnosis not present

## 2016-07-16 DIAGNOSIS — J4521 Mild intermittent asthma with (acute) exacerbation: Secondary | ICD-10-CM | POA: Diagnosis not present

## 2016-07-16 DIAGNOSIS — G43709 Chronic migraine without aura, not intractable, without status migrainosus: Secondary | ICD-10-CM | POA: Diagnosis not present

## 2016-07-16 DIAGNOSIS — N952 Postmenopausal atrophic vaginitis: Secondary | ICD-10-CM | POA: Diagnosis not present

## 2016-08-24 DIAGNOSIS — M545 Low back pain: Secondary | ICD-10-CM | POA: Diagnosis not present

## 2016-09-14 DIAGNOSIS — G43709 Chronic migraine without aura, not intractable, without status migrainosus: Secondary | ICD-10-CM | POA: Diagnosis not present

## 2016-09-23 DIAGNOSIS — G43719 Chronic migraine without aura, intractable, without status migrainosus: Secondary | ICD-10-CM | POA: Diagnosis not present

## 2016-09-23 DIAGNOSIS — G4700 Insomnia, unspecified: Secondary | ICD-10-CM | POA: Diagnosis not present

## 2016-09-23 DIAGNOSIS — M797 Fibromyalgia: Secondary | ICD-10-CM | POA: Diagnosis not present

## 2016-09-24 DIAGNOSIS — G43519 Persistent migraine aura without cerebral infarction, intractable, without status migrainosus: Secondary | ICD-10-CM | POA: Insufficient documentation

## 2016-09-24 DIAGNOSIS — F54 Psychological and behavioral factors associated with disorders or diseases classified elsewhere: Secondary | ICD-10-CM | POA: Insufficient documentation

## 2016-09-24 DIAGNOSIS — M797 Fibromyalgia: Secondary | ICD-10-CM | POA: Diagnosis not present

## 2016-09-24 DIAGNOSIS — R0609 Other forms of dyspnea: Secondary | ICD-10-CM | POA: Insufficient documentation

## 2016-09-24 DIAGNOSIS — Z6838 Body mass index (BMI) 38.0-38.9, adult: Secondary | ICD-10-CM | POA: Insufficient documentation

## 2016-09-24 DIAGNOSIS — Z0000 Encounter for general adult medical examination without abnormal findings: Secondary | ICD-10-CM | POA: Diagnosis not present

## 2016-09-30 DIAGNOSIS — Z0000 Encounter for general adult medical examination without abnormal findings: Secondary | ICD-10-CM | POA: Diagnosis not present

## 2016-09-30 DIAGNOSIS — R0609 Other forms of dyspnea: Secondary | ICD-10-CM | POA: Diagnosis not present

## 2016-10-19 DIAGNOSIS — Z87891 Personal history of nicotine dependence: Secondary | ICD-10-CM | POA: Diagnosis not present

## 2016-10-19 DIAGNOSIS — Z9884 Bariatric surgery status: Secondary | ICD-10-CM | POA: Diagnosis not present

## 2016-10-21 DIAGNOSIS — R635 Abnormal weight gain: Secondary | ICD-10-CM | POA: Insufficient documentation

## 2016-10-25 DIAGNOSIS — J029 Acute pharyngitis, unspecified: Secondary | ICD-10-CM | POA: Diagnosis not present

## 2016-10-26 DIAGNOSIS — K219 Gastro-esophageal reflux disease without esophagitis: Secondary | ICD-10-CM | POA: Diagnosis not present

## 2016-10-26 DIAGNOSIS — Z9884 Bariatric surgery status: Secondary | ICD-10-CM | POA: Diagnosis not present

## 2016-10-28 DIAGNOSIS — J0100 Acute maxillary sinusitis, unspecified: Secondary | ICD-10-CM | POA: Diagnosis not present

## 2016-11-01 DIAGNOSIS — J209 Acute bronchitis, unspecified: Secondary | ICD-10-CM | POA: Diagnosis not present

## 2016-11-03 DIAGNOSIS — R609 Edema, unspecified: Secondary | ICD-10-CM | POA: Diagnosis not present

## 2016-11-03 DIAGNOSIS — J4521 Mild intermittent asthma with (acute) exacerbation: Secondary | ICD-10-CM | POA: Diagnosis not present

## 2016-11-24 DIAGNOSIS — G43109 Migraine with aura, not intractable, without status migrainosus: Secondary | ICD-10-CM | POA: Diagnosis not present

## 2016-11-24 DIAGNOSIS — G43711 Chronic migraine without aura, intractable, with status migrainosus: Secondary | ICD-10-CM | POA: Diagnosis not present

## 2016-11-24 DIAGNOSIS — G43119 Migraine with aura, intractable, without status migrainosus: Secondary | ICD-10-CM | POA: Diagnosis not present

## 2016-11-24 DIAGNOSIS — Z9884 Bariatric surgery status: Secondary | ICD-10-CM | POA: Diagnosis not present

## 2016-11-24 DIAGNOSIS — J45909 Unspecified asthma, uncomplicated: Secondary | ICD-10-CM | POA: Diagnosis not present

## 2016-11-24 DIAGNOSIS — M797 Fibromyalgia: Secondary | ICD-10-CM | POA: Diagnosis not present

## 2016-11-24 DIAGNOSIS — G43719 Chronic migraine without aura, intractable, without status migrainosus: Secondary | ICD-10-CM | POA: Diagnosis not present

## 2016-11-25 DIAGNOSIS — R51 Headache: Secondary | ICD-10-CM | POA: Diagnosis not present

## 2016-11-25 DIAGNOSIS — R400 Somnolence: Secondary | ICD-10-CM | POA: Diagnosis not present

## 2016-11-25 DIAGNOSIS — R0683 Snoring: Secondary | ICD-10-CM | POA: Diagnosis not present

## 2016-11-29 DIAGNOSIS — M9905 Segmental and somatic dysfunction of pelvic region: Secondary | ICD-10-CM | POA: Diagnosis not present

## 2016-11-29 DIAGNOSIS — M9901 Segmental and somatic dysfunction of cervical region: Secondary | ICD-10-CM | POA: Diagnosis not present

## 2016-11-29 DIAGNOSIS — M50322 Other cervical disc degeneration at C5-C6 level: Secondary | ICD-10-CM | POA: Diagnosis not present

## 2016-12-01 DIAGNOSIS — D649 Anemia, unspecified: Secondary | ICD-10-CM | POA: Diagnosis not present

## 2016-12-01 DIAGNOSIS — Z9884 Bariatric surgery status: Secondary | ICD-10-CM | POA: Diagnosis not present

## 2016-12-01 DIAGNOSIS — R12 Heartburn: Secondary | ICD-10-CM | POA: Diagnosis not present

## 2016-12-01 DIAGNOSIS — R1319 Other dysphagia: Secondary | ICD-10-CM | POA: Diagnosis not present

## 2016-12-01 DIAGNOSIS — E611 Iron deficiency: Secondary | ICD-10-CM | POA: Diagnosis not present

## 2016-12-09 DIAGNOSIS — Z9884 Bariatric surgery status: Secondary | ICD-10-CM | POA: Diagnosis not present

## 2016-12-13 DIAGNOSIS — J4521 Mild intermittent asthma with (acute) exacerbation: Secondary | ICD-10-CM | POA: Diagnosis not present

## 2016-12-13 DIAGNOSIS — R0602 Shortness of breath: Secondary | ICD-10-CM | POA: Diagnosis not present

## 2016-12-13 DIAGNOSIS — R609 Edema, unspecified: Secondary | ICD-10-CM | POA: Diagnosis not present

## 2016-12-14 DIAGNOSIS — H2513 Age-related nuclear cataract, bilateral: Secondary | ICD-10-CM | POA: Diagnosis not present

## 2016-12-14 DIAGNOSIS — H43812 Vitreous degeneration, left eye: Secondary | ICD-10-CM | POA: Diagnosis not present

## 2016-12-14 DIAGNOSIS — H353132 Nonexudative age-related macular degeneration, bilateral, intermediate dry stage: Secondary | ICD-10-CM | POA: Diagnosis not present

## 2016-12-16 ENCOUNTER — Other Ambulatory Visit: Payer: Self-pay | Admitting: Family Medicine

## 2016-12-16 DIAGNOSIS — R609 Edema, unspecified: Secondary | ICD-10-CM

## 2016-12-16 DIAGNOSIS — R0609 Other forms of dyspnea: Secondary | ICD-10-CM

## 2016-12-16 DIAGNOSIS — R7989 Other specified abnormal findings of blood chemistry: Secondary | ICD-10-CM

## 2016-12-17 DIAGNOSIS — Z01818 Encounter for other preprocedural examination: Secondary | ICD-10-CM | POA: Diagnosis not present

## 2016-12-17 DIAGNOSIS — Z9884 Bariatric surgery status: Secondary | ICD-10-CM | POA: Diagnosis not present

## 2016-12-17 DIAGNOSIS — Z713 Dietary counseling and surveillance: Secondary | ICD-10-CM | POA: Diagnosis not present

## 2016-12-20 DIAGNOSIS — D151 Benign neoplasm of heart: Secondary | ICD-10-CM

## 2016-12-20 DIAGNOSIS — J45909 Unspecified asthma, uncomplicated: Secondary | ICD-10-CM | POA: Insufficient documentation

## 2016-12-20 HISTORY — DX: Benign neoplasm of heart: D15.1

## 2016-12-23 ENCOUNTER — Other Ambulatory Visit (HOSPITAL_COMMUNITY): Payer: 59

## 2016-12-23 DIAGNOSIS — J45909 Unspecified asthma, uncomplicated: Secondary | ICD-10-CM | POA: Diagnosis not present

## 2016-12-23 DIAGNOSIS — G43719 Chronic migraine without aura, intractable, without status migrainosus: Secondary | ICD-10-CM | POA: Diagnosis not present

## 2016-12-23 DIAGNOSIS — G43109 Migraine with aura, not intractable, without status migrainosus: Secondary | ICD-10-CM | POA: Diagnosis not present

## 2016-12-24 ENCOUNTER — Other Ambulatory Visit: Payer: Self-pay

## 2016-12-24 ENCOUNTER — Inpatient Hospital Stay (HOSPITAL_COMMUNITY): Payer: 59

## 2016-12-24 ENCOUNTER — Ambulatory Visit (INDEPENDENT_AMBULATORY_CARE_PROVIDER_SITE_OTHER): Payer: 59 | Admitting: Internal Medicine

## 2016-12-24 ENCOUNTER — Encounter: Payer: Self-pay | Admitting: Internal Medicine

## 2016-12-24 ENCOUNTER — Encounter (HOSPITAL_COMMUNITY): Payer: Self-pay

## 2016-12-24 ENCOUNTER — Inpatient Hospital Stay (HOSPITAL_COMMUNITY)
Admission: AD | Admit: 2016-12-24 | Discharge: 2017-01-02 | DRG: 228 | Disposition: A | Discharge status: Home / Self Care | Payer: 59 | Source: Ambulatory Visit | Attending: Cardiothoracic Surgery | Admitting: Cardiothoracic Surgery

## 2016-12-24 ENCOUNTER — Ambulatory Visit (HOSPITAL_BASED_OUTPATIENT_CLINIC_OR_DEPARTMENT_OTHER): Payer: 59

## 2016-12-24 VITALS — BP 124/82 | HR 108 | Ht 67.0 in | Wt 230.0 lb

## 2016-12-24 DIAGNOSIS — F329 Major depressive disorder, single episode, unspecified: Secondary | ICD-10-CM | POA: Diagnosis present

## 2016-12-24 DIAGNOSIS — E669 Obesity, unspecified: Secondary | ICD-10-CM | POA: Diagnosis present

## 2016-12-24 DIAGNOSIS — I5031 Acute diastolic (congestive) heart failure: Secondary | ICD-10-CM | POA: Diagnosis not present

## 2016-12-24 DIAGNOSIS — I081 Rheumatic disorders of both mitral and tricuspid valves: Secondary | ICD-10-CM | POA: Diagnosis present

## 2016-12-24 DIAGNOSIS — I5030 Unspecified diastolic (congestive) heart failure: Secondary | ICD-10-CM | POA: Diagnosis not present

## 2016-12-24 DIAGNOSIS — R0609 Other forms of dyspnea: Secondary | ICD-10-CM

## 2016-12-24 DIAGNOSIS — Z6836 Body mass index (BMI) 36.0-36.9, adult: Secondary | ICD-10-CM | POA: Diagnosis not present

## 2016-12-24 DIAGNOSIS — M810 Age-related osteoporosis without current pathological fracture: Secondary | ICD-10-CM | POA: Diagnosis present

## 2016-12-24 DIAGNOSIS — M797 Fibromyalgia: Secondary | ICD-10-CM | POA: Diagnosis not present

## 2016-12-24 DIAGNOSIS — G2581 Restless legs syndrome: Secondary | ICD-10-CM | POA: Diagnosis not present

## 2016-12-24 DIAGNOSIS — I272 Pulmonary hypertension, unspecified: Secondary | ICD-10-CM | POA: Diagnosis not present

## 2016-12-24 DIAGNOSIS — R0602 Shortness of breath: Secondary | ICD-10-CM | POA: Diagnosis not present

## 2016-12-24 DIAGNOSIS — I371 Nonrheumatic pulmonary valve insufficiency: Secondary | ICD-10-CM | POA: Insufficient documentation

## 2016-12-24 DIAGNOSIS — Z87891 Personal history of nicotine dependence: Secondary | ICD-10-CM | POA: Diagnosis not present

## 2016-12-24 DIAGNOSIS — I519 Heart disease, unspecified: Secondary | ICD-10-CM

## 2016-12-24 DIAGNOSIS — R609 Edema, unspecified: Secondary | ICD-10-CM

## 2016-12-24 DIAGNOSIS — D62 Acute posthemorrhagic anemia: Secondary | ICD-10-CM | POA: Diagnosis present

## 2016-12-24 DIAGNOSIS — J45909 Unspecified asthma, uncomplicated: Secondary | ICD-10-CM | POA: Diagnosis present

## 2016-12-24 DIAGNOSIS — F988 Other specified behavioral and emotional disorders with onset usually occurring in childhood and adolescence: Secondary | ICD-10-CM | POA: Diagnosis present

## 2016-12-24 DIAGNOSIS — Z9884 Bariatric surgery status: Secondary | ICD-10-CM

## 2016-12-24 DIAGNOSIS — I5189 Other ill-defined heart diseases: Secondary | ICD-10-CM

## 2016-12-24 DIAGNOSIS — Z79899 Other long term (current) drug therapy: Secondary | ICD-10-CM

## 2016-12-24 DIAGNOSIS — Z9889 Other specified postprocedural states: Secondary | ICD-10-CM

## 2016-12-24 DIAGNOSIS — R7989 Other specified abnormal findings of blood chemistry: Secondary | ICD-10-CM

## 2016-12-24 DIAGNOSIS — J90 Pleural effusion, not elsewhere classified: Secondary | ICD-10-CM | POA: Diagnosis not present

## 2016-12-24 DIAGNOSIS — Z01810 Encounter for preprocedural cardiovascular examination: Secondary | ICD-10-CM | POA: Diagnosis not present

## 2016-12-24 DIAGNOSIS — I340 Nonrheumatic mitral (valve) insufficiency: Secondary | ICD-10-CM | POA: Diagnosis not present

## 2016-12-24 DIAGNOSIS — D151 Benign neoplasm of heart: Secondary | ICD-10-CM | POA: Diagnosis not present

## 2016-12-24 DIAGNOSIS — D213 Benign neoplasm of connective and other soft tissue of thorax: Secondary | ICD-10-CM | POA: Diagnosis not present

## 2016-12-24 DIAGNOSIS — J9811 Atelectasis: Secondary | ICD-10-CM | POA: Diagnosis not present

## 2016-12-24 DIAGNOSIS — G43909 Migraine, unspecified, not intractable, without status migrainosus: Secondary | ICD-10-CM | POA: Insufficient documentation

## 2016-12-24 LAB — ECHOCARDIOGRAM COMPLETE
Ao-asc: 30 cm
CHL CUP LVOT MV VTI: 0.52
E decel time: 176 msec
E/e' ratio: 52.03
FS: 39 % (ref 28–44)
IV/PV OW: 1.19
LA diam end sys: 54 mm
LADIAMINDEX: 2.54 cm/m2
LASIZE: 54 mm
LAVOL: 97 mL
LAVOLA4C: 93 mL
LAVOLIN: 45.5 mL/m2
LV E/e' medial: 52.03
LV E/e'average: 52.03
LV PW d: 9.06 mm — AB (ref 0.6–1.1)
LV TDI E'LATERAL: 5.17
LVELAT: 5.17 cm/s
LVOT MV VTI INDEX: 0.24 cm2/m2
LVOT VTI: 13.6 cm
LVOT area: 2.27 cm2
LVOT peak grad rest: 4 mmHg
LVOT peak vel: 95 cm/s
LVOTD: 17 mm
LVOTSV: 31 mL
MV Dec: 176
MV M vel: 219
MV Peak grad: 29 mmHg
MV pk A vel: 219 m/s
MV pk E vel: 269 m/s
MVANNULUSVTI: 59.2 cm
MVAP: 3.86 cm2
MVG: 20 mmHg
MVSPHT: 57 ms
RV LATERAL S' VELOCITY: 12.7 cm/s
RV sys press: 89 mmHg
Reg peak vel: 431 cm/s
TDI e' medial: 5.85
TRMAXVEL: 431 cm/s

## 2016-12-24 LAB — COMPREHENSIVE METABOLIC PANEL
ALK PHOS: 71 U/L (ref 38–126)
ALT: 59 U/L — ABNORMAL HIGH (ref 14–54)
ANION GAP: 5 (ref 5–15)
AST: 51 U/L — ABNORMAL HIGH (ref 15–41)
Albumin: 3.3 g/dL — ABNORMAL LOW (ref 3.5–5.0)
BILIRUBIN TOTAL: 0.7 mg/dL (ref 0.3–1.2)
BUN: 9 mg/dL (ref 6–20)
CALCIUM: 8.4 mg/dL — AB (ref 8.9–10.3)
CO2: 21 mmol/L — ABNORMAL LOW (ref 22–32)
Chloride: 113 mmol/L — ABNORMAL HIGH (ref 101–111)
Creatinine, Ser: 0.63 mg/dL (ref 0.44–1.00)
GLUCOSE: 94 mg/dL (ref 65–99)
POTASSIUM: 3.3 mmol/L — AB (ref 3.5–5.1)
Sodium: 139 mmol/L (ref 135–145)
TOTAL PROTEIN: 6 g/dL — AB (ref 6.5–8.1)

## 2016-12-24 LAB — APTT: aPTT: 28 seconds (ref 24–36)

## 2016-12-24 LAB — TROPONIN I: Troponin I: <0.03 ng/mL (ref <0.03)

## 2016-12-24 LAB — BRAIN NATRIURETIC PEPTIDE: B NATRIURETIC PEPTIDE 5: 492.1 pg/mL — AB (ref 0.0–100.0)

## 2016-12-24 MED ORDER — ACETAMINOPHEN 325 MG PO TABS: 650.0000 mg | ORAL_TABLET | ORAL | Status: DC | PRN

## 2016-12-24 MED ORDER — INFLUENZA VAC SPLIT QUAD 0.5 ML IM SUSY: 0.5000 mL | PREFILLED_SYRINGE | INTRAMUSCULAR | Status: DC | PRN

## 2016-12-24 MED ORDER — ENOXAPARIN SODIUM 40 MG/0.4ML ~~LOC~~ SOLN
40.0000 mg | SUBCUTANEOUS | Status: DC
  Administered 2016-12-25 – 2016-12-26 (×2): 40 mg via SUBCUTANEOUS
  Filled 2016-12-24 (×2): qty 0.4

## 2016-12-24 MED ORDER — BUDESONIDE 0.25 MG/2ML IN SUSP
0.2500 mg | Freq: Two times a day (BID) | RESPIRATORY_TRACT | Status: DC
  Administered 2016-12-25 – 2016-12-27 (×6): 0.25 mg via RESPIRATORY_TRACT
  Filled 2016-12-24 (×6): qty 2

## 2016-12-24 MED ORDER — FUROSEMIDE 10 MG/ML IJ SOLN
20.0000 mg | Freq: Every day | INTRAMUSCULAR | Status: DC
  Administered 2016-12-24 – 2016-12-26 (×3): 20 mg via INTRAVENOUS
  Filled 2016-12-24 (×3): qty 2

## 2016-12-24 MED ORDER — NITROGLYCERIN 0.4 MG SL SUBL: 0.4000 mg | SUBLINGUAL_TABLET | SUBLINGUAL | Status: DC | PRN

## 2016-12-24 MED ORDER — HYDROXYZINE HCL 10 MG PO TABS
10.0000 mg | ORAL_TABLET | Freq: Three times a day (TID) | ORAL | Status: DC | PRN
  Filled 2016-12-24: qty 1

## 2016-12-24 MED ORDER — TIZANIDINE HCL 2 MG PO TABS
2.0000 mg | ORAL_TABLET | Freq: Four times a day (QID) | ORAL | Status: DC | PRN
  Administered 2016-12-26: 2 mg via ORAL
  Filled 2016-12-24 (×3): qty 1

## 2016-12-24 MED ORDER — METOPROLOL TARTRATE 12.5 MG HALF TABLET
12.5000 mg | ORAL_TABLET | Freq: Two times a day (BID) | ORAL | Status: DC
  Administered 2016-12-24 – 2016-12-27 (×8): 12.5 mg via ORAL
  Filled 2016-12-24 (×8): qty 1

## 2016-12-24 MED ORDER — POTASSIUM CHLORIDE CRYS ER 20 MEQ PO TBCR
40.0000 meq | EXTENDED_RELEASE_TABLET | ORAL | Status: AC
  Administered 2016-12-24 (×2): 40 meq via ORAL
  Filled 2016-12-24 (×2): qty 2

## 2016-12-24 MED ORDER — LORATADINE 10 MG PO TABS
10.0000 mg | ORAL_TABLET | Freq: Every day | ORAL | Status: DC
  Administered 2016-12-27: 10 mg via ORAL
  Filled 2016-12-24 (×2): qty 1

## 2016-12-24 MED ORDER — ALBUTEROL SULFATE (2.5 MG/3ML) 0.083% IN NEBU: 2.5000 mg | INHALATION_SOLUTION | Freq: Four times a day (QID) | RESPIRATORY_TRACT | Status: DC | PRN

## 2016-12-24 MED ORDER — ASPIRIN EC 81 MG PO TBEC
81.0000 mg | DELAYED_RELEASE_TABLET | Freq: Every day | ORAL | Status: DC
  Administered 2016-12-25 – 2016-12-26 (×2): 81 mg via ORAL
  Filled 2016-12-24 (×2): qty 1

## 2016-12-24 MED ORDER — ONDANSETRON HCL 4 MG/2ML IJ SOLN: 4.0000 mg | Freq: Four times a day (QID) | INTRAMUSCULAR | Status: DC | PRN

## 2016-12-24 NOTE — H&P (Signed)
Cardiology Admission History and Physical:   Patient ID: Melissa [name]; MRN: [id number]; DOB: 24-Nov-1965   Admission date: 12/24/2016  Primary Care Provider: Maurice Small, MD Primary Cardiologist: New - Caisen Mangas Primary Electrophysiologist:  None  Chief Complaint:  Shortness of breath  Patient Profile:   Melissa [name] is a 51 y.o. female with a history of asthma, admitted with progressive heart failure symptoms and echo demonstrating left atrial mass obstructing the mitral valve.  History of Present Illness:   Melissa [name] presented today for routine echocardiogram for evaluation of progressive shortness of breath and leg edema. She notes that over the last 6 months, she has had progressive exertional dyspnea, which is now present with even mild activity. She has to stop when she reaches the top of her and the steps in her home resting catch her breath. She also has frequent lightheadedness when walking or standing for more than a few minutes at a time. She has not passed out. She denies chest pain and palpitations. She also denies orthopnea and PND. She has not had any focal neurologic changes, including speech difficulty and amaurosis fugax. She denies a history of prior cardiovascular disease.  Over the last several months, she has been using her inhaler more, thinking that her dyspnea was due to asthma. However, this has not helped much. Echo today showed a large heterogeneous mass in the left atrium with obstruction of the actual valve resulting in a mean transmitral gradient of 20 mmHg.   Past Medical History:  Diagnosis Date  . ADD (attention deficit disorder)   . Allergy   . Arthritis   . Asthma   . Depression   . Migraine   . Osteoporosis   . RLS (restless legs syndrome)     Past Surgical History:  Procedure Laterality Date  . ABDOMINAL HYSTERECTOMY    . CHOLECYSTECTOMY    . FOOT SURGERY    . GASTRIC BYPASS    . TOTAL KNEE ARTHROPLASTY    . TUBAL LIGATION        Medications Prior to Admission: Prior to Admission medications   Medication Sig Start Date [place] Date Taking? Authorizing Provider  albuterol (VENTOLIN HFA) 108 (90 Base) MCG/ACT inhaler INHALE 2 PUFFS INTO THE LUNGS EVERY 6 HOURS AS NEEDED FOR WHEEZING 07/09/15   [provider]  cetirizine (ZYRTEC) 10 MG tablet Take 10 mg by mouth daily.      [provider]  Cholecalciferol (VITAMIN D3) 50000 units CAPS TK 1 C PO Q WK 08/26/15   [provider]  hydrOXYzine (ATARAX/VISTARIL) 10 MG tablet Take 10 mg by mouth 3 (three) times daily as needed (migraines).    [provider]  Mometasone Furoate [facility] HFA) 200 MCG/ACT AERO Inhale 200 mcg into the lungs 2 (two) times daily.    [provider]  Multiple Vitamin (MULTIVITAMIN) tablet Take 1 tablet by mouth daily.      [provider]  promethazine (PHENERGAN) 12.5 MG tablet Take 1-2 tablets by mouth 3 (three) times daily as needed for nausea/vomiting. 12/17/16   [provider]  SUMAtriptan 6 MG/0.5ML SOAJ Inject one at onset of severe headache; may repeat once in 2 hours if HA persists 09/23/16   [provider]  tiZANidine (ZANAFLEX) 2 MG tablet Take 2 mg by mouth every 6 (six) hours as needed (headache).  10/11/15   [provider]     Allergies:    Allergies  Allergen Reactions  . Other Other (See  Comments) and Anaphylaxis    Honeydew melon Honeydew melon Honey dew melon: unknown reaction    Social History:   Social History   Social History  . Marital status: Married    Spouse name: N/A  . Number of children: N/A  . Years of education: N/A   Occupational History  . Not on file.   Social History Main Topics  . Smoking status: Former Smoker    Packs/day: 0.25    Years: 10.00    Types: Cigarettes    Quit date: 1997  . Smokeless tobacco: Never Used  . Alcohol use No     Comment: Less than once a week.  . Drug use: No  . Sexual activity: Not on file    Other Topics Concern  . Not on file   Social History Narrative  . No narrative on file    Family History:  The patient's family history includes Breast cancer in her maternal aunt, maternal aunt, maternal grandmother, mother, and sister; Depression in her other; Stroke in her paternal grandfather.    ROS:  Review of Systems  Constitutional: Positive for malaise/fatigue.  HENT: Negative.   Eyes: Negative.   Respiratory: Positive for shortness of breath.   Cardiovascular: Positive for leg swelling. Negative for chest pain, palpitations, orthopnea and PND.  Gastrointestinal: Negative.   Genitourinary: Negative.   Musculoskeletal: Negative.   Skin: Negative.   Neurological: Positive for dizziness and headaches.  Endo/Heme/Allergies: Negative.   Psychiatric/Behavioral: Negative.     Physical Exam/Data:   Vitals:   12/24/16 1537  BP: 122/74  Pulse: 94  Temp: 98.6 F (37 C)  TempSrc: Oral  SpO2: 97%  Weight: 229 lb 12.8 oz (104.2 kg)  Height: 5\' 7"  (1.702 m)   No intake or output data in the 24 hours ending 12/24/16 1559 Filed Weights   12/24/16 1537  Weight: 229 lb 12.8 oz (104.2 kg)   Body mass index is 35.99 kg/m.  General:  Obese woman seated on the exam table. She is accompanied by her husband. HEENT: No conjunctival pallor or scleral icterus. Moist mucous membranes. OP clear. Neck: Supple without lymphadenopathy, thyromegaly, JVD, or HJR. No carotid bruit. Lungs: Normal work of breathing. Mildly diminished breath sounds throughout without wheezes or crackles.  Heart: Regular rate and rhythm with 1/6 systolic murmur. No rubs or gallops. Unable to assess PMI due to body habitus. Abd: Bowel sounds present. Soft, NT/ND. Unable to assess HSM due to body habitus. Ext: 1+ pretibial edema bilaterally. Radial, PT, and DP pulses are 2+ bilaterally Skin: Warm and dry without rash. Neuro: CNIII-XII intact. Strength and fine-touch sensation intact in upper and lower  extremities bilaterally. Psych: Patient appears anxious.   EKG:  The ECG that was done today was personally reviewed and demonstrates Sinus tachycardia with biatrial enlargement and right axis deviation.  Relevant CV Studies:  TTE (12/24/16, prelim): Normal LV contraction. Large, heterogenous left atrial mass with obstruction of LV inflow. Transmitral gradient is ~20 mm. There is moderate to severe tricuspid regurgitation with PA pressure of at least 70 mmHg and elevated CVP.  Laboratory Data:  ChemistryNo results for input(s): NA, K, CL, CO2, GLUCOSE, BUN, CREATININE, CALCIUM, GFRNONAA, GFRAA, ANIONGAP in the last 168 hours.  No results for input(s): PROT, ALBUMIN, AST, ALT, ALKPHOS, BILITOT in the last 168 hours. HematologyNo results for input(s): WBC, RBC, HGB, HCT, MCV, MCH, MCHC, RDW, PLT in the last 168 hours. Cardiac EnzymesNo results for input(s): TROPONINI in the last 168 hours. No  results for input(s): TROPIPOC in the last 168 hours.  BNPNo results for input(s): BNP, PROBNP in the last 168 hours.  DDimer No results for input(s): DDIMER in the last 168 hours.  Radiology/Studies:  No results found.  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 Complex blood cyst is a consideration. Cardiac surgery has been consulted for expedited workup and likely surgical removal.  Heart failure with preserved ejection fraction Patient has symptoms of worsening heart failure (NYHA class III) with normal LVEF by echo. Symptoms are most likely due to functional mitral stenosis with an elevated mean gradient secondary to the left atrial mass. We will initiate low-dose beta blockade with metoprolol tartrate 12.5 mg twice a day and also diuresis the patient gently with IV furosemide.  Severity of Illness: The appropriate patient status for this patient is INPATIENT. Inpatient status is judged to be  reasonable and necessary in order to provide the required intensity of service to ensure the patient's safety. The patient's presenting symptoms, physical exam findings, and initial radiographic and laboratory data in the context of their chronic comorbidities is felt to place them at high risk for further clinical deterioration. Furthermore, it is not anticipated that the patient will be medically stable for discharge from the hospital within 2 midnights of admission. The following factors support the patient status of inpatient.   " The patient's presenting symptoms include worsening heart failure with significant exertional dyspnea and lightheadedness. " The worrisome physical exam findings include significant leg edema. " The initial radiographic and laboratory data are worrisome because of left atrial mass with obstruction of the mitral valve and severe functional mitral stenosis with severe pulmonary hypertension. " The chronic co-morbidities include obesity and asthma.  * I certify that at the point of admission it is my clinical judgment that the patient will require inpatient hospital care spanning beyond 2 midnights from the point of admission due to high intensity of service, high risk for further deterioration and high frequency of surveillance required.*   For questions or updates, please contact [place] Please consult [email or web address] for contact info under Cardiology/STEMI.    Signed, Nelva Bush, MD  12/24/2016 3:59 PM

## 2016-12-24 NOTE — Progress Notes (Signed)
New Outpatient Visit Date: 12/24/2016  Referring Provider: Maurice Small, MD [facility] [address] [place],  [address]  Chief Complaint: Shortness of breath  HPI:  Ms. [address] is a 51 y.o. female who is being seen today for the evaluation of shortness of breath and abnormal echo. This patient presented today for routine echocardiogram for evaluation of progressive shortness of breath and leg edema. She notes that over the last 6 months, she has had progressive exertional dyspnea, which is now present with even mild activity. She has to stop when she reaches the top of her and the steps in her home resting catch her breath. She also has frequent lightheadedness when walking or standing for more than a few minutes at a time. She has not passed out. She denies chest pain and palpitations. She also denies orthopnea and PND. She has not had any focal neurologic changes, including speech difficulty and amaurosis fugax. She denies a history of prior cardiovascular disease.  Over the last several months, she has been using her inhaler more, thinking that her dyspnea was due to asthma. However, this has not helped much. Echo today showed a large heterogeneous mass in the left atrium with obstruction of the actual valve resulting in a mean transmitral gradient of 20 mmHg.  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 Large, heterogenous left atrial mass with obstruction of LV inflow. Transmitral gradient is ~20 mm. There is moderate to severe tricuspid regurgitation with PA pressure of at least 70 mmHg and elevated  CVP.  Recent CV Pertinent Labs: Lab Results  Component Value Date   CHOL 145 06/21/2012   HDL 64.60 06/21/2012   LDLCALC 73 06/21/2012   LDLDIRECT 111.7 07/08/2008   TRIG 36.0 06/21/2012   CHOLHDL 2 06/21/2012   INR 1.1 05/01/2008   K 4.3 06/10/2015   MG 2.2 06/21/2012   BUN 8 06/10/2015   CREATININE 0.54 06/10/2015    --------------------------------------------------------------------------------------------------  Past Medical History:  Diagnosis Date  . ADD (attention deficit disorder)   . Allergy   . Arthritis   . Asthma   . Depression   . Migraine   . Osteoporosis   . RLS (restless legs syndrome)     Past Surgical History:  Procedure Laterality Date  . ABDOMINAL HYSTERECTOMY    . CHOLECYSTECTOMY    . FOOT SURGERY    . GASTRIC BYPASS    . TOTAL KNEE ARTHROPLASTY    . TUBAL LIGATION      Current Meds  Medication Sig  . albuterol (VENTOLIN HFA) 108 (90 Base) MCG/ACT inhaler INHALE 2 PUFFS INTO THE LUNGS EVERY 6 HOURS AS NEEDED FOR WHEEZING  . cetirizine (ZYRTEC) 10 MG tablet Take 10 mg by mouth daily.    . Cholecalciferol (VITAMIN D3) 50000 units CAPS TK 1 C PO Q WK  . hydrOXYzine (ATARAX/VISTARIL) 10 MG tablet Take 10 mg by mouth 3 (three) times daily as needed (migraines).  . Mometasone Furoate (ASMANEX HFA) 200 MCG/ACT AERO Inhale 200 mcg into the lungs 2 (two) times daily.  . Multiple Vitamin (MULTIVITAMIN) tablet Take 1 tablet by mouth daily.    . promethazine (PHENERGAN) 12.5 MG tablet Take 1-2 tablets by mouth 3 (three) times daily as needed for nausea/vomiting.  Marland Kitchen  SUMAtriptan 6 MG/0.5ML SOAJ Inject one at onset of severe headache; may repeat once in 2 hours if HA persists  . tiZANidine (ZANAFLEX) 2 MG tablet Take 2 mg by mouth every 6 (six) hours as needed (headache).     Allergies: Other  Social History   Social History  . Marital status: Married    Spouse name: N/A  . Number of children: N/A  . Years of education: N/A   Occupational  History  . Not on file.   Social History Main Topics  . Smoking status: Former Smoker    Packs/day: 0.25    Years: 10.00    Types: Cigarettes    Quit date: 1997  . Smokeless tobacco: Never Used  . Alcohol use No     Comment: Less than once a week.  . Drug use: No  . Sexual activity: Not on file   Other Topics Concern  . Not on file   Social History Narrative  . No narrative on file    Family History  Problem Relation Age of Onset  . Breast cancer Mother   . Breast cancer Sister   . Breast cancer Maternal Aunt   . Breast cancer Maternal Grandmother   . Breast cancer Maternal Aunt   . Depression Other        FMHx  . Stroke Paternal Grandfather     Review of Systems: A 12-system review of systems was performed and was negative except as noted in the HPI.  --------------------------------------------------------------------------------------------------  Physical Exam: BP 124/82   Pulse (!) 108   Ht 5\' 7"  (1.702 m)   Wt 230 lb (104.3 kg)   BMI 36.02 kg/m   General:  Obese woman seated on the exam table. She is accompanied by her husband. HEENT: No conjunctival pallor or scleral icterus. Moist mucous membranes. OP clear. Neck: Supple without lymphadenopathy, thyromegaly, JVD, or HJR. No carotid bruit. Lungs: Normal work of breathing. Mildly diminished breath sounds throughout without wheezes or crackles.  Heart: Regular rate and rhythm with 1/6 systolic murmur. No rubs or gallops. Unable to assess PMI due to body habitus. Abd: Bowel sounds present. Soft, NT/ND. Unable to assess HSM due to body habitus. Ext: 1+ pretibial edema bilaterally. Radial, PT, and DP pulses are 2+ bilaterally Skin: Warm and dry without rash. Neuro: CNIII-XII intact. Strength and fine-touch sensation intact in upper and lower extremities bilaterally. Psych: Patient appears anxious.  EKG:  Sinus tachycardia with biatrial enlargement and right axis deviation.  Lab Results  Component Value  Date   WBC 7.9 06/10/2015   HGB 12.9 06/10/2015   HCT 38.7 06/10/2015   MCV 87.0 06/10/2015   PLT 300 06/10/2015    Lab Results  Component Value Date   NA 142 06/10/2015   K 4.3 06/10/2015   CL 105 06/10/2015   CO2 25 06/10/2015   BUN 8 06/10/2015   CREATININE 0.54 06/10/2015   GLUCOSE 91 06/10/2015   ALT 37 (H) 06/21/2012    Lab Results  Component Value Date   CHOL 145 06/21/2012   HDL 64.60 06/21/2012   LDLCALC 73 06/21/2012   LDLDIRECT 111.7 07/08/2008   TRIG 36.0 06/21/2012   CHOLHDL 2 06/21/2012     --------------------------------------------------------------------------------------------------  ASSESSMENT AND PLAN: Left atrial mass Most likely diagnosis would be a myxoma, though the heterogeneous appearance is somewhat atypical for this. Complex blood cyst is a consideration. We will admit the patient to [facility] for further workup and urgent cardiothoracic surgical consult.  Heart  failure with preserved ejection fraction Patient has symptoms of worsening heart failure (NYHA class III) with normal LVEF by echo. Symptoms are most likely due to functional mitral stenosis with a mean gradient secondary to the left atrial mass. We will initiate low-dose beta blockade with metoprolol tartrate 12.5 mg twice a day and also diuresis the patient gently with IV furosemide.  Follow-up: Directly admit to [facility] for work-up and management, as above.  Nelva Bush, MD 12/24/2016 3:49 PM

## 2016-12-25 ENCOUNTER — Inpatient Hospital Stay (HOSPITAL_COMMUNITY): Payer: 59

## 2016-12-25 DIAGNOSIS — I340 Nonrheumatic mitral (valve) insufficiency: Secondary | ICD-10-CM

## 2016-12-25 DIAGNOSIS — I519 Heart disease, unspecified: Secondary | ICD-10-CM

## 2016-12-25 LAB — MRSA PCR SCREENING: MRSA by PCR: NEGATIVE

## 2016-12-25 LAB — COMPREHENSIVE METABOLIC PANEL
ALT: 59 U/L — ABNORMAL HIGH (ref 14–54)
AST: 45 U/L — ABNORMAL HIGH (ref 15–41)
Albumin: 3 g/dL — ABNORMAL LOW (ref 3.5–5.0)
Alkaline Phosphatase: 64 U/L (ref 38–126)
Anion gap: 8 (ref 5–15)
BUN: 12 mg/dL (ref 6–20)
CO2: 21 mmol/L — ABNORMAL LOW (ref 22–32)
Calcium: 8.2 mg/dL — ABNORMAL LOW (ref 8.9–10.3)
Chloride: 110 mmol/L (ref 101–111)
Creatinine, Ser: 0.63 mg/dL (ref 0.44–1.00)
GFR calc Af Amer: >60 mL/min (ref >60)
GFR calc non Af Amer: >60 mL/min (ref >60)
Glucose, Bld: 89 mg/dL (ref 65–99)
Potassium: 4.1 mmol/L (ref 3.5–5.1)
Sodium: 139 mmol/L (ref 135–145)
Total Bilirubin: 0.6 mg/dL (ref 0.3–1.2)
Total Protein: 5.5 g/dL — ABNORMAL LOW (ref 6.5–8.1)

## 2016-12-25 LAB — CBC WITH DIFFERENTIAL/PLATELET
BASOS ABS: 0.1 10*3/uL (ref 0.0–0.1)
Basophils Relative: 1 %
Eosinophils Absolute: 0.2 10*3/uL (ref 0.0–0.7)
Eosinophils Relative: 2 %
HEMATOCRIT: 32.8 % — AB (ref 36.0–46.0)
HEMOGLOBIN: 10.2 g/dL — AB (ref 12.0–15.0)
LYMPHS PCT: 27 %
Lymphs Abs: 2.2 10*3/uL (ref 0.7–4.0)
MCH: 24.9 pg — ABNORMAL LOW (ref 26.0–34.0)
MCHC: 31.1 g/dL (ref 30.0–36.0)
MCV: 80.2 fL (ref 78.0–100.0)
MONO ABS: 0.5 10*3/uL (ref 0.1–1.0)
MONOS PCT: 7 %
NEUTROS ABS: 5.3 10*3/uL (ref 1.7–7.7)
NEUTROS PCT: 63 %
PLATELETS: 341 10*3/uL (ref 150–400)
RBC: 4.09 MIL/uL (ref 3.87–5.11)
RDW: 16.4 % — ABNORMAL HIGH (ref 11.5–15.5)
WBC: 8.3 10*3/uL (ref 4.0–10.5)

## 2016-12-25 LAB — URINALYSIS, ROUTINE W REFLEX MICROSCOPIC
Bilirubin Urine: NEGATIVE
GLUCOSE, UA: NEGATIVE mg/dL
HGB URINE DIPSTICK: NEGATIVE
Ketones, ur: NEGATIVE mg/dL
Leukocytes, UA: NEGATIVE
Nitrite: NEGATIVE
PH: 5 (ref 5.0–8.0)
Protein, ur: NEGATIVE mg/dL
SPECIFIC GRAVITY, URINE: 1.012 (ref 1.005–1.030)

## 2016-12-25 LAB — HEMOGLOBIN A1C
Hgb A1c MFr Bld: 5.1 % (ref 4.8–5.6)
Mean Plasma Glucose: 99.67 mg/dL

## 2016-12-25 LAB — HIV ANTIBODY (ROUTINE TESTING W REFLEX): HIV Screen 4th Generation wRfx: NONREACTIVE

## 2016-12-25 LAB — TSH: TSH: 1.362 u[IU]/mL (ref 0.350–4.500)

## 2016-12-25 LAB — PROTIME-INR
INR: 1.07
Prothrombin Time: 13.8 seconds (ref 11.4–15.2)

## 2016-12-25 MED ORDER — IOPAMIDOL (ISOVUE-300) INJECTION 61%
INTRAVENOUS | Status: AC
  Administered 2016-12-25: 100 mL
  Filled 2016-12-25: qty 75

## 2016-12-25 MED ORDER — TRAZODONE HCL 50 MG PO TABS
50.0000 mg | ORAL_TABLET | Freq: Every day | ORAL | Status: DC
  Administered 2016-12-25 – 2016-12-27 (×3): 50 mg via ORAL
  Filled 2016-12-25 (×3): qty 1

## 2016-12-25 NOTE — Progress Notes (Signed)
  Subjective: No complaints  Objective: Vital signs in last 24 hours: Temp:  [97.7 F (36.5 C)-98.3 F (36.8 C)] 97.8 F (36.6 C) (10/06 1300) Pulse Rate:  [88-103] 103 (10/06 1300) Cardiac Rhythm: Sinus bradycardia (10/06 0700) Resp:  [16-18] 16 (10/06 1300) BP: (101-134)/(73-92) 134/92 (10/06 1300) SpO2:  [98 %-100 %] 100 % (10/06 1300) Weight:  [227 lb 6.4 oz (103.1 kg)] 227 lb 6.4 oz (103.1 kg) (10/06 0335)  Hemodynamic parameters for last 24 hours:  nsr  Intake/Output from previous day: 10/05 0701 - 10/06 0700 In: 480 [P.O.:480] Out: 1650 [Urine:1650] Intake/Output this shift: Total I/O In: 360 [P.O.:360] Out: -        Exam    General- alert and comfortable   Lungs- clear without rales, wheezes   Cor- regular rate and rhythm, no murmur , gallop   Abdomen- soft, non-tender   Extremities - warm, non-tender, minimal edema   Neuro- oriented, appropriate, no focal weakness   Lab Results:  Recent Labs  12/25/16 0254  WBC 8.3  HGB 10.2*  HCT 32.8*  PLT 341   BMET:  Recent Labs  12/24/16 1611 12/25/16 0254  NA 139 139  K 3.3* 4.1  CL 113* 110  CO2 21* 21*  GLUCOSE 94 89  BUN 9 12  CREATININE 0.63 0.63  CALCIUM 8.4* 8.2*    PT/INR:  Recent Labs  12/25/16 0254  LABPROT 13.8  INR 1.07   ABG No results found for: PHART, HCO3, TCO2, ACIDBASEDEF, O2SAT CBG (last 3)  No results for input(s): GLUCAP in the last 72 hours.  Assessment/Plan: LA myxoma CT chest w/contrast L&R heart cath mon  Surgery after cath Procedure d/w patient and husband  LOS: 1 day    Tharon Aquas Trigt III 12/25/2016

## 2016-12-25 NOTE — Consult Note (Signed)
[address]       [place] [address]             [phone]        Melissa [name] [facility] Medical Record #[id number] Date of Birth: 07/15/1965  Referring: Minta Balsam Primary Care: Maurice Small, MD  Chief Complaint:   Shortness of breath, edema  History of Present Illness:    Patient examined, transthoracic echocardiogram images personally reviewed and counseled with patient and husband  Very nice 51 year old obese reformed smoker status post gastric bypass surgery with fibromyalgia and chronic migraine headaches was admitted after echocardiogram showing a 4 cm left atrial myxoma. The patient has had progressive symptoms of shortness of breath decreased exercise tolerance probable PND and ankle edema. She has maintained a sinus rhythm. She has no history of cardiac disease. The patient states she's had general anesthesia without complication for several procedures including knee replacement surgery bariatric surgery and hysterectomy.  The patient denies any neurologic symptoms or symptoms of embolic problems from the left atrial myxoma. Echocardiogram shows no structural disease of the mitral valve but there is some moderate functional tricuspid regurgitation from probable pulmonary hypertension. LV function is preserved. Cardiac catheterization is pending. The patient is currently stable and telemetry unit.  Current Activity/ Functional Status: Patient works full time and is active with her husband and family.   Zubrod Score: At the time of surgery this patient's most appropriate activity status/level should be described as: []     0    Normal activity, no symptoms [x]     1    Restricted in physical strenuous activity but ambulatory, able to do out light work []     2    Ambulatory and capable of self care, unable to do work activities, up and about                 more than 50%  Of the time                            []     3    Only limited self care, in bed  greater than 50% of waking hours []     4    Completely disabled, no self care, confined to bed or chair []     5    Moribund  Past Medical History:  Diagnosis Date  . ADD (attention deficit disorder)   . Allergy   . Arthritis   . Asthma   . Depression   . Migraine   . Osteoporosis   . RLS (restless legs syndrome)     Past Surgical History:  Procedure Laterality Date  . ABDOMINAL HYSTERECTOMY    . CHOLECYSTECTOMY    . FOOT SURGERY    . GASTRIC BYPASS    . TOTAL KNEE ARTHROPLASTY    . TUBAL LIGATION      History  Smoking Status  . Former Smoker  . Packs/day: 0.25  . Years: 10.00  . Types: Cigarettes  . Quit date: 1997  Smokeless Tobacco  . Never Used    History  Alcohol Use No    Comment: Less than once a week.    Social History   Social History  . Marital status: Married    Spouse name: N/A  . Number of children: N/A  . Years of education: N/A   Occupational History  . Not on file.   Social History Main  Topics  . Smoking status: Former Smoker    Packs/day: 0.25    Years: 10.00    Types: Cigarettes    Quit date: 1997  . Smokeless tobacco: Never Used  . Alcohol use No     Comment: Less than once a week.  . Drug use: No  . Sexual activity: Not on file   Other Topics Concern  . Not on file   Social History Narrative   Married.    Allergies  Allergen Reactions  . Other Anaphylaxis and Other (See Comments)    HONEYDEW MELON    Current Facility-Administered Medications  Medication Dose Route Frequency Provider Last Rate Last Dose  . acetaminophen (TYLENOL) tablet 650 mg  650 mg Oral Q4H PRN Ahmed Prima, Brittany M, PA-C      . albuterol (PROVENTIL) (2.5 MG/3ML) 0.083% nebulizer solution 2.5 mg  2.5 mg Inhalation Q6H PRN Strader, Brittany M, PA-C      . aspirin EC tablet 81 mg  81 mg Oral Daily Strader, [place] M, PA-C      . budesonide (PULMICORT) nebulizer solution 0.25 mg  0.25 mg Nebulization BID Strader, Brittany M, PA-C      . enoxaparin  (LOVENOX) injection 40 mg  40 mg Subcutaneous Q24H Strader, Brittany M, PA-C      . furosemide (LASIX) injection 20 mg  20 mg Intravenous Daily Strader, [place] M, PA-C   20 mg at 12/24/16 1718  . hydrOXYzine (ATARAX/VISTARIL) tablet 10 mg  10 mg Oral TID PRN Erma Heritage, PA-C      . Influenza vac split quadrivalent PF (FLUARIX) injection 0.5 mL  0.5 mL Intramuscular Prior to discharge End, Harrell Gave, MD      . loratadine (CLARITIN) tablet 10 mg  10 mg Oral Daily Strader, [place] M, PA-C      . metoprolol tartrate (LOPRESSOR) tablet 12.5 mg  12.5 mg Oral BID Bernerd Pho M, PA-C   12.5 mg at 12/24/16 2300  . nitroGLYCERIN (NITROSTAT) SL tablet 0.4 mg  0.4 mg Sublingual Q5 Min x 3 PRN Strader, Brittany M, PA-C      . ondansetron [facility]) injection 4 mg  4 mg Intravenous Q6H PRN Strader, [place] M, PA-C      . tiZANidine (ZANAFLEX) tablet 2 mg  2 mg Oral Q6H PRN Erma Heritage, PA-C        Prescriptions Prior to Admission  Medication Sig Dispense Refill Last Dose  . albuterol (VENTOLIN HFA) 108 (90 Base) MCG/ACT inhaler INHALE 2 PUFFS INTO THE LUNGS EVERY 6 HOURS AS NEEDED FOR WHEEZING   Taking  . cetirizine (ZYRTEC) 10 MG tablet Take 10 mg by mouth daily.     Taking  . Cholecalciferol (VITAMIN D3) 50000 units CAPS TK 1 C PO Q WK  0 Taking  . hydrOXYzine (ATARAX/VISTARIL) 10 MG tablet Take 10 mg by mouth 3 (three) times daily as needed (migraines).   Taking  . Mometasone Furoate (ASMANEX HFA) 200 MCG/ACT AERO Inhale 200 mcg into the lungs 2 (two) times daily.   Taking  . Multiple Vitamin (MULTIVITAMIN) tablet Take 1 tablet by mouth daily.     Taking  . promethazine (PHENERGAN) 12.5 MG tablet Take 1-2 tablets by mouth 3 (three) times daily as needed for nausea/vomiting.  5 Taking  . SUMAtriptan 6 MG/0.5ML SOAJ Inject one at onset of severe headache; may repeat once in 2 hours if HA persists   Taking  . tiZANidine (ZANAFLEX) 2 MG tablet Take 2 mg by mouth every  6 (six) hours  as needed (headache).   5 Taking    Family History  Problem Relation Age of Onset  . Breast cancer Mother   . Breast cancer Sister   . Breast cancer Maternal Aunt   . Breast cancer Maternal Grandmother   . Breast cancer Maternal Aunt   . Depression Other        FMHx  . Stroke Paternal Grandfather      Review of Systems:       Cardiac Review of Systems: Y or N  Chest Pain [ No   ]  Resting SOB [ yes ] Exertional SOB  [ yes ]  Orthopnea [ yes ]   Pedal Edema [ yes  ]    Palpitations [ no ] Syncope  [ no ]   Presyncope [no   ]  General Review of Systems: [Y] = yes [  ]=no Constitional: recent weight change [  ]; anorexia [  ]; fatigue [ yes ]; nausea [  ]; night sweats [  ]; fever [  ]; or chills [  ]                                                               Dental: poor dentition[  ]; Last Dentist visit: 6 months  Eye : blurred vision [  ]; diplopia [   ]; vision changes [  ];  Amaurosis fugax[  ]; Resp: cough [  ];  wheezing[  ];  hemoptysis[  ]; shortness of breath[  ]; paroxysmal nocturnal dyspnea[  yes]; dyspnea on exertion[ yes ]; or orthopnea[  ];  GI:  gallstones[  ], vomiting[  ];  dysphagia[  ]; melena[  ];  hematochezia [  ]; heartburn[  ];   Hx of  Colonoscopy[  ]; GU: kidney stones [  ]; hematuria[  ];   dysuria [  ];  nocturia[  ];  history of     obstruction [  ]; urinary frequency [  ]             Skin: rash, swelling[  ];, hair loss[  ];  peripheral edema[  ];  or itching[  ]; Musculosketetal: myalgias[  ];  joint swelling[  ];  joint erythema[  ];  joint pain[  ];  back pain[  ];  Heme/Lymph: bruising[  ];  bleeding[  ];  anemia[  ];  Neuro: TIA[  ];  headaches[ yes migraine syndrome ];  stroke[  ];  vertigo[  ];  seizures[  ];   paresthesias[  ];  difficulty walking[  ]; fibromyalgias  Psych:depression[  ]; anxiety[  ];  Endocrine: diabetes[  ];  thyroid dysfunction[  ];  Immunizations: Flu [  ]; Pneumococcal[  ];  Other: Left-hand-dominant                          No history of thoracic trauma or procedures  Physical Exam: BP 101/73 (BP Location: Right Arm)   Pulse 90   Temp 97.7 F (36.5 C) (Oral)   Ht 5\' 7"  (1.702 m)   Wt 227 lb 6.4 oz (103.1 kg)   SpO2 97%   BMI 35.62 kg/m  Physical Exam  General: Very nice middle-aged obese female no acute distress HEENT: Normocephalic pupils equal , dentition adequate Neck: Supple without JVD, adenopathy, or bruit Chest: Clear to auscultation, symmetrical breath sounds, no rhonchi, no tenderness             or deformity Cardiovascular: Regular rate and rhythm,   1/6 systolicmurmur - TR, no gallop, peripheral pulses             palpable in all extremities Abdomen:  Soft, nontender, no palpable mass or organomegaly Extremities: Warm, well-perfused, no clubbing cyanosis edema or tenderness,              no venous stasis changes of the legs Rectal/GU: Deferred Neuro: Grossly non--focal and symmetrical throughout Skin: Clean and dry without rash or ulceration   Diagnostic Studies & Laboratory data:     Recent Radiology Findings:   Dg Chest 2 View  Result Date: 12/24/2016 CLINICAL DATA:  Mass in the atrium EXAM: CHEST  2 VIEW COMPARISON:  06/10/2015 FINDINGS: Small bilateral effusions. No focal consolidation. Mild cardiomegaly. Increased cardiac opacity with slight splaying of the carina. No pneumothorax IMPRESSION: 1. Small bilateral effusions 2. Mild cardiomegaly with left atrial enlargement. Electronically Signed   By: Donavan Foil M.D.   On: 12/24/2016 20:56     I have independently reviewed the above radiologic studies. chest x-ray clear  Recent Lab Findings: Lab Results  Component Value Date   WBC 8.3 12/25/2016   HGB 10.2 (L) 12/25/2016   HCT 32.8 (L) 12/25/2016   PLT 341 12/25/2016   GLUCOSE 89 12/25/2016   CHOL 145 06/21/2012   TRIG 36.0 06/21/2012   HDL 64.60 06/21/2012   LDLDIRECT 111.7 07/08/2008   LDLCALC 73 06/21/2012   ALT 59 (H) 12/25/2016   AST 45 (H)  12/25/2016   NA 139 12/25/2016   K 4.1 12/25/2016   CL 110 12/25/2016   CREATININE 0.63 12/25/2016   BUN 12 12/25/2016   CO2 21 (L) 12/25/2016   TSH 1.362 12/25/2016   INR 1.07 12/25/2016   HGBA1C 5.1 12/25/2016      Assessment / Plan:      4 cm left atrial myxoma with symptoms of heart failure from obstruction of the mitral valve-mitral stenosis physiology with pulmonary hypertension and 2+ tricuspid regurgitation  Surgical extirpation is recommended Prior to surgery she will need a left and right heart catheterization Surgery should be performed this hospitalization next week.  I discussed the diagnosis, plan for surgical therapy and the patient and her husband understand and agree.    @ME1 @ 12/25/2016 7:54 AM

## 2016-12-25 NOTE — Progress Notes (Signed)
CARDIAC REHAB PHASE I   PRE:  Rate/Rhythm: 66 nsr  BP:  Sitting: 119/92      SaO2: 99 ra  MODE:  Ambulation: 500 ft   POST:  Rate/Rhythm: 76  BP:  Sitting: 134/92     SaO2: 98 ra 1155-1230 Patient ambulated in hallway unassisted. Steady gait noted. Denied complaints of SOB or light headedness. Anxious about upcoming surgery. Post ambulation patient back to chair with call bell and phone in reach. Father at bedside. Reviewed [facility] booklet and video information with patient and dad. Gave and patient demonstrated use of IS. Will follow post surgery.  Audrie Kuri English PayneRN, BSN 12/25/2016 12:56 PM

## 2016-12-25 NOTE — Progress Notes (Signed)
Progress Note  Patient Name: Melissa [name] Date of Encounter: 12/25/2016  Primary Cardiologist: End  Subjective   Baseline dyspnea  Inpatient Medications    Scheduled Meds: . aspirin EC  81 mg Oral Daily  . budesonide  0.25 mg Nebulization BID  . enoxaparin (LOVENOX) injection  40 mg Subcutaneous Q24H  . furosemide  20 mg Intravenous Daily  . loratadine  10 mg Oral Daily  . metoprolol tartrate  12.5 mg Oral BID   Continuous Infusions:  PRN Meds: acetaminophen, albuterol, hydrOXYzine, Influenza vac split quadrivalent PF, nitroGLYCERIN, ondansetron (ZOFRAN) IV, tiZANidine   Vital Signs    Vitals:   12/24/16 2259 12/25/16 0335 12/25/16 0700 12/25/16 0803  BP: 119/74 101/73    Pulse: 88 90    Temp: 98.3 F (36.8 C) 97.7 F (36.5 C)    TempSrc: Oral Oral    SpO2:    98%  Weight:  227 lb 6.4 oz (103.1 kg)    Height:   5\' 7"  (1.702 m)     Intake/Output Summary (Last 24 hours) at 12/25/16 0927 Last data filed at 12/25/16 0900  Gross per 24 hour  Intake              600 ml  Output             1650 ml  Net            -1050 ml   Filed Weights   12/24/16 1537 12/25/16 0335  Weight: 229 lb 12.8 oz (104.2 kg) 227 lb 6.4 oz (103.1 kg)    Telemetry    NSR 12/25/2016  - Personally Reviewed  ECG    NSR no acute changes  - Personally Reviewed  Physical Exam  Previous bariatric surgery  GEN: No acute distress.   Neck: No JVD Cardiac: RRR, no murmurs, rubs, or gallops.  Respiratory: Clear to auscultation bilaterally. GI: Soft, nontender, non-distended  MS: No edema; No deformity. Neuro:  Nonfocal  Psych: Normal affect    Labs    Chemistry Recent Labs Lab 12/24/16 1611 12/25/16 0254  NA 139 139  K 3.3* 4.1  CL 113* 110  CO2 21* 21*  GLUCOSE 94 89  BUN 9 12  CREATININE 0.63 0.63  CALCIUM 8.4* 8.2*  PROT 6.0* 5.5*  ALBUMIN 3.3* 3.0*  AST 51* 45*  ALT 59* 59*  ALKPHOS 71 64  BILITOT 0.7 0.6  GFRNONAA >60 >60  GFRAA >60 >60  ANIONGAP 5 8      Hematology Recent Labs Lab 12/25/16 0254  WBC 8.3  RBC 4.09  HGB 10.2*  HCT 32.8*  MCV 80.2  MCH 24.9*  MCHC 31.1  RDW 16.4*  PLT 341    Cardiac Enzymes Recent Labs Lab 12/24/16 1611  TROPONINI <0.03   No results for input(s): TROPIPOC in the last 168 hours.   BNP Recent Labs Lab 12/24/16 1611  BNP 492.1*     DDimer No results for input(s): DDIMER in the last 168 hours.   Radiology    Dg Chest 2 View  Result Date: 12/24/2016 CLINICAL DATA:  Mass in the atrium EXAM: CHEST  2 VIEW COMPARISON:  06/10/2015 FINDINGS: Small bilateral effusions. No focal consolidation. Mild cardiomegaly. Increased cardiac opacity with slight splaying of the carina. No pneumothorax IMPRESSION: 1. Small bilateral effusions 2. Mild cardiomegaly with left atrial enlargement. Electronically Signed   By: Donavan Foil M.D.   On: 12/24/2016 20:56    Cardiac Studies   Echo with large  LA cystic mass likely atypical myxoma  Patient Profile     51 y.o. female with 9 months progressive dyspnea Echo noted large LA myxoma with functional MS and pulmonary HTN  Seen by PVT for right and left cath Monday and surgery Tuesday or Wends day   Assessment & Plan    1) Myxoma: likely although atypical cystic appearance reviewed echo ON lovenox not systemic anticoagulation. Continue lasix and beta blocker to maximize diastolic filling time and help dyspnea Have placed orders for right and left cath and put on board with Dr End since he is in lab  Darius Bump and Gerald Stabs husband are personal friends of mine   For questions or updates, please contact [place] Please consult [email or web address] for contact info under Cardiology/STEMI.      Signed, Jenkins Rouge, MD  12/25/2016, 9:27 AM

## 2016-12-26 MED ORDER — ASPIRIN 81 MG PO CHEW
81.0000 mg | CHEWABLE_TABLET | ORAL | Status: AC
  Administered 2016-12-27: 81 mg via ORAL
  Filled 2016-12-26: qty 1

## 2016-12-26 MED ORDER — SODIUM CHLORIDE 0.9 % IV SOLN: 250.0000 mL | INTRAVENOUS | Status: DC | PRN

## 2016-12-26 MED ORDER — PRAMIPEXOLE DIHYDROCHLORIDE 0.25 MG PO TABS: 1.0000 mg | ORAL_TABLET | Freq: Two times a day (BID) | ORAL | Status: DC | PRN

## 2016-12-26 MED ORDER — SODIUM CHLORIDE 0.9% FLUSH
3.0000 mL | Freq: Two times a day (BID) | INTRAVENOUS | Status: DC
  Administered 2016-12-26 – 2016-12-27 (×2): 3 mL via INTRAVENOUS

## 2016-12-26 MED ORDER — SODIUM CHLORIDE 0.9 % IV SOLN: INTRAVENOUS | Status: DC

## 2016-12-26 MED ORDER — SODIUM CHLORIDE 0.9% FLUSH: 3.0000 mL | INTRAVENOUS | Status: DC | PRN

## 2016-12-26 NOTE — Progress Notes (Signed)
Called to order pt's home mirapex. Dose is somewhat higher than usual dose for RLS. Spoke with pharmacy who contacted OP pharmacy confirming long-time dose. Pt also verified this is her longstanding home dose, no problems on this dose. Will reorder and follow. Aaliyan Brinkmeier PA-C

## 2016-12-26 NOTE — Progress Notes (Signed)
Progress Note  Patient Name: Melissa [name] Date of Encounter: 12/26/2016  Primary Cardiologist: End  Subjective   No complaints anxious about surgery   Inpatient Medications    Scheduled Meds: . aspirin EC  81 mg Oral Daily  . budesonide  0.25 mg Nebulization BID  . enoxaparin (LOVENOX) injection  40 mg Subcutaneous Q24H  . furosemide  20 mg Intravenous Daily  . loratadine  10 mg Oral Daily  . metoprolol tartrate  12.5 mg Oral BID  . traZODone  50 mg Oral QHS   Continuous Infusions:  PRN Meds: acetaminophen, albuterol, hydrOXYzine, Influenza vac split quadrivalent PF, nitroGLYCERIN, ondansetron (ZOFRAN) IV, tiZANidine   Vital Signs    Vitals:   12/25/16 1942 12/25/16 1949 12/26/16 0351 12/26/16 0716  BP:  115/73 96/64 113/78  Pulse:  71 (!) 57 (!) 59  Resp:  (!) 22 17 17   Temp:  97.6 F (36.4 C) (!) 97.5 F (36.4 C) (!) 97.5 F (36.4 C)  TempSrc:  Oral Oral Oral  SpO2: 98% 97% 97% 100%  Weight:   225 lb 14.4 oz (102.5 kg)   Height:        Intake/Output Summary (Last 24 hours) at 12/26/16 0754 Last data filed at 12/25/16 1826  Gross per 24 hour  Intake              600 ml  Output                0 ml  Net              600 ml   Filed Weights   12/24/16 1537 12/25/16 0335 12/26/16 0351  Weight: 229 lb 12.8 oz (104.2 kg) 227 lb 6.4 oz (103.1 kg) 225 lb 14.4 oz (102.5 kg)    Telemetry    NSR 12/26/2016  - Personally Reviewed  ECG    NSR no acute changes  - Personally Reviewed  Physical Exam  Previous bariatric surgery  Affect appropriate Healthy:  appears stated age HEENT: normal Neck supple with no adenopathy JVP normal no bruits no thyromegaly Lungs clear with no wheezing and good diaphragmatic motion Heart:  S1/S2 no murmur, no rub, gallop or click PMI normal Abdomen: benighn, BS positve, no tenderness, no AAA no bruit.  No HSM or HJR Distal pulses intact with no bruits No edema Neuro non-focal Skin warm and dry No muscular  weakness   Labs    Chemistry  Recent Labs Lab 12/24/16 1611 12/25/16 0254  NA 139 139  K 3.3* 4.1  CL 113* 110  CO2 21* 21*  GLUCOSE 94 89  BUN 9 12  CREATININE 0.63 0.63  CALCIUM 8.4* 8.2*  PROT 6.0* 5.5*  ALBUMIN 3.3* 3.0*  AST 51* 45*  ALT 59* 59*  ALKPHOS 71 64  BILITOT 0.7 0.6  GFRNONAA >60 >60  GFRAA >60 >60  ANIONGAP 5 8     Hematology  Recent Labs Lab 12/25/16 0254  WBC 8.3  RBC 4.09  HGB 10.2*  HCT 32.8*  MCV 80.2  MCH 24.9*  MCHC 31.1  RDW 16.4*  PLT 341    Cardiac Enzymes  Recent Labs Lab 12/24/16 1611  TROPONINI <0.03   No results for input(s): TROPIPOC in the last 168 hours.   BNP  Recent Labs Lab 12/24/16 1611  BNP 492.1*     DDimer No results for input(s): DDIMER in the last 168 hours.   Radiology    Dg Chest 2 View  Result  Date: 12/24/2016 CLINICAL DATA:  Mass in the atrium EXAM: CHEST  2 VIEW COMPARISON:  06/10/2015 FINDINGS: Small bilateral effusions. No focal consolidation. Mild cardiomegaly. Increased cardiac opacity with slight splaying of the carina. No pneumothorax IMPRESSION: 1. Small bilateral effusions 2. Mild cardiomegaly with left atrial enlargement. Electronically Signed   By: Donavan Foil M.D.   On: 12/24/2016 20:56   Ct Chest W Contrast  Result Date: 12/25/2016 CLINICAL DATA:  Atrial myxoma, dyspnea and asthma. EXAM: CT CHEST WITH CONTRAST TECHNIQUE: Multidetector CT imaging of the chest was performed during intravenous contrast administration. CONTRAST:  75 cc Isovue-300 IV COMPARISON:  None. FINDINGS: Cardiovascular: There is a soft tissue mass within the left atrium measuring 4 x 6.2 x 4.1 cm with its base along the interatrial septum and slightly prolapsing through mitral valve.Normal branch pattern of the great vessels. No aortic aneurysm or dissection. Preferential enhancement of the pulmonary arterial system. Dilatation of the main pulmonary artery consistent with a component of pulmonary hypertension is  seen up to 3.5 cm in caliber. There has also dilatation of the right and left main pulmonary artery is, measuring 3.4 cm on the right and 2 cm on the left. Coronary arteriosclerosis is noted predominant involving the LAD. Mediastinum/Nodes: Tiny too small to further characterize hypodensities within the thyroid without thyromegaly. Lungs/Pleura: Scarring and/or atelectasis is seen in the lingula in each lung base. No pneumonic consolidation, effusion or pneumothorax. No dominant mass. Upper Abdomen: Status post bariatric surgery. No acute upper abdominal abnormality. Musculoskeletal: Midthoracic kyphosis attributable to moderate disc space narrowing and endplate spurring. No acute nor suspicious osseous abnormality. IMPRESSION: 1. Intracavitary mass predominantly noted within the left atrium with slight protrusion through the mitral valve, possibly representing a ball valve effect, overall measuring 4 x 6.2 x 4.1 cm and consistent with reported history of atrial myxoma. 2. Dilatation of the main, right and left pulmonary arteries consistent with pulmonary hypertension. 3. Coronary arteriosclerosis. 4. Lingular bibasilar scarring and/or atelectasis. Electronically Signed   By: Ashley Royalty M.D.   On: 12/25/2016 19:46    Cardiac Studies   Echo with large LA cystic mass likely atypical myxoma  Patient Profile     51 y.o. female with 9 months progressive dyspnea Echo noted large LA myxoma with functional MS and pulmonary HTN  Seen by PVT for right and left cath Monday and surgery Tuesday or Wends day   Assessment & Plan    1) Myxoma: on beta blocker and lasix to decrease filling pressures and increase diastolic filling time. Cath in am with Dr End and possible surgery on Tuesday with PVT   Darius Bump and Gerald Stabs husband are personal friends of mine   For questions or updates, please contact [place] Please consult [email or web address] for contact info under Cardiology/STEMI.      Signed, Jenkins Rouge,  MD  12/26/2016, 7:54 AM

## 2016-12-27 ENCOUNTER — Encounter (HOSPITAL_COMMUNITY): Payer: Self-pay | Admitting: Certified Registered Nurse Anesthetist

## 2016-12-27 ENCOUNTER — Other Ambulatory Visit: Payer: Self-pay

## 2016-12-27 ENCOUNTER — Encounter (HOSPITAL_COMMUNITY)
Admission: AD | Disposition: A | Discharge status: Home / Self Care | Payer: Self-pay | Source: Ambulatory Visit | Attending: Cardiothoracic Surgery

## 2016-12-27 ENCOUNTER — Inpatient Hospital Stay (HOSPITAL_COMMUNITY): Payer: 59

## 2016-12-27 DIAGNOSIS — Z01810 Encounter for preprocedural cardiovascular examination: Secondary | ICD-10-CM

## 2016-12-27 DIAGNOSIS — D151 Benign neoplasm of heart: Principal | ICD-10-CM

## 2016-12-27 DIAGNOSIS — I5031 Acute diastolic (congestive) heart failure: Secondary | ICD-10-CM

## 2016-12-27 LAB — POCT ACTIVATED CLOTTING TIME: Activated Clotting Time: 164 seconds

## 2016-12-27 LAB — POCT I-STAT 3, ART BLOOD GAS (G3+)
ACID-BASE DEFICIT: 2 mmol/L (ref 0.0–2.0)
Bicarbonate: 21.9 mmol/L (ref 20.0–28.0)
O2 SAT: 99 %
TCO2: 23 mmol/L (ref 22–32)
pCO2 arterial: 35.1 mmHg (ref 32.0–48.0)
pH, Arterial: 7.402 (ref 7.350–7.450)
pO2, Arterial: 133 mmHg — ABNORMAL HIGH (ref 83.0–108.0)

## 2016-12-27 LAB — POCT I-STAT 3, VENOUS BLOOD GAS (G3P V)
ACID-BASE DEFICIT: 1 mmol/L (ref 0.0–2.0)
BICARBONATE: 23.7 mmol/L (ref 20.0–28.0)
O2 SAT: 73 %
TCO2: 25 mmol/L (ref 22–32)
pCO2, Ven: 39.9 mmHg — ABNORMAL LOW (ref 44.0–60.0)
pH, Ven: 7.381 (ref 7.250–7.430)
pO2, Ven: 39 mmHg (ref 32.0–45.0)

## 2016-12-27 LAB — PREPARE RBC (CROSSMATCH)

## 2016-12-27 LAB — HEMOGLOBIN A1C
Hgb A1c MFr Bld: 5.2 % (ref 4.8–5.6)
Mean Plasma Glucose: 102.54 mg/dL

## 2016-12-27 SURGERY — RIGHT HEART CATH AND CORONARY ANGIOGRAPHY
Anesthesia: LOCAL

## 2016-12-27 MED ORDER — FUROSEMIDE 10 MG/ML IJ SOLN
20.0000 mg | Freq: Two times a day (BID) | INTRAMUSCULAR | Status: DC
  Administered 2016-12-27: 20 mg via INTRAVENOUS
  Filled 2016-12-27: qty 2

## 2016-12-27 MED ORDER — NITROGLYCERIN IN D5W 200-5 MCG/ML-% IV SOLN
2.0000 ug/min | INTRAVENOUS | Status: AC
  Administered 2016-12-28: 5 ug/min via INTRAVENOUS
  Filled 2016-12-27: qty 250

## 2016-12-27 MED ORDER — IOPAMIDOL (ISOVUE-370) INJECTION 76%
INTRAVENOUS | Status: AC
  Filled 2016-12-27: qty 100

## 2016-12-27 MED ORDER — LIDOCAINE HCL 2 % IJ SOLN
INTRAMUSCULAR | Status: AC
  Filled 2016-12-27: qty 10

## 2016-12-27 MED ORDER — ALPRAZOLAM 0.25 MG PO TABS
0.2500 mg | ORAL_TABLET | ORAL | Status: DC | PRN
  Administered 2016-12-27: 0.25 mg via ORAL
  Filled 2016-12-27: qty 1

## 2016-12-27 MED ORDER — BISACODYL 5 MG PO TBEC
5.0000 mg | DELAYED_RELEASE_TABLET | Freq: Once | ORAL | Status: AC
  Administered 2016-12-27: 5 mg via ORAL
  Filled 2016-12-27: qty 1

## 2016-12-27 MED ORDER — DOPAMINE-DEXTROSE 3.2-5 MG/ML-% IV SOLN
0.0000 ug/kg/min | INTRAVENOUS | Status: DC
  Filled 2016-12-27: qty 250

## 2016-12-27 MED ORDER — HEPARIN SODIUM (PORCINE) 1000 UNIT/ML IJ SOLN
INTRAMUSCULAR | Status: DC | PRN
  Administered 2016-12-27: 5000 [IU] via INTRAVENOUS

## 2016-12-27 MED ORDER — METOPROLOL TARTRATE 12.5 MG HALF TABLET
12.5000 mg | ORAL_TABLET | Freq: Once | ORAL | Status: AC
  Administered 2016-12-28: 12.5 mg via ORAL
  Filled 2016-12-27: qty 1

## 2016-12-27 MED ORDER — VERAPAMIL HCL 2.5 MG/ML IV SOLN
INTRAVENOUS | Status: AC
  Filled 2016-12-27: qty 2

## 2016-12-27 MED ORDER — SODIUM CHLORIDE 0.9 % IV SOLN: 250.0000 mL | INTRAVENOUS | Status: DC | PRN

## 2016-12-27 MED ORDER — MAGNESIUM SULFATE 50 % IJ SOLN
40.0000 meq | INTRAMUSCULAR | Status: DC
  Filled 2016-12-27: qty 10

## 2016-12-27 MED ORDER — VANCOMYCIN HCL 10 G IV SOLR
1500.0000 mg | INTRAVENOUS | Status: AC
  Administered 2016-12-28: 1500 mg via INTRAVENOUS
  Filled 2016-12-27: qty 1500

## 2016-12-27 MED ORDER — TEMAZEPAM 7.5 MG PO CAPS: 15.0000 mg | ORAL_CAPSULE | Freq: Once | ORAL | Status: DC | PRN

## 2016-12-27 MED ORDER — LIDOCAINE HCL (PF) 1 % IJ SOLN
INTRAMUSCULAR | Status: DC | PRN
  Administered 2016-12-27: 2 mL via SUBCUTANEOUS
  Administered 2016-12-27: 2 mL

## 2016-12-27 MED ORDER — SODIUM CHLORIDE 0.9% FLUSH: 3.0000 mL | INTRAVENOUS | Status: DC | PRN

## 2016-12-27 MED ORDER — TRANEXAMIC ACID (OHS) PUMP PRIME SOLUTION
2.0000 mg/kg | INTRAVENOUS | Status: DC
  Filled 2016-12-27: qty 2.04

## 2016-12-27 MED ORDER — TRANEXAMIC ACID (OHS) BOLUS VIA INFUSION
15.0000 mg/kg | INTRAVENOUS | Status: AC
  Administered 2016-12-28: 1531.5 mg via INTRAVENOUS
  Filled 2016-12-27: qty 1532

## 2016-12-27 MED ORDER — CHLORHEXIDINE GLUCONATE 4 % EX LIQD
60.0000 mL | Freq: Once | CUTANEOUS | Status: AC
  Administered 2016-12-28: 4 via TOPICAL
  Filled 2016-12-27: qty 60

## 2016-12-27 MED ORDER — POTASSIUM CHLORIDE 2 MEQ/ML IV SOLN
80.0000 meq | INTRAVENOUS | Status: DC
  Filled 2016-12-27: qty 40

## 2016-12-27 MED ORDER — DIAZEPAM 2 MG PO TABS
2.0000 mg | ORAL_TABLET | Freq: Once | ORAL | Status: DC
  Filled 2016-12-27: qty 1

## 2016-12-27 MED ORDER — SODIUM CHLORIDE 0.9 % IV SOLN
INTRAVENOUS | Status: DC
  Filled 2016-12-27: qty 1

## 2016-12-27 MED ORDER — DEXMEDETOMIDINE HCL IN NACL 400 MCG/100ML IV SOLN
0.1000 ug/kg/h | INTRAVENOUS | Status: DC
  Filled 2016-12-27: qty 100

## 2016-12-27 MED ORDER — SODIUM CHLORIDE 0.9% FLUSH
3.0000 mL | Freq: Two times a day (BID) | INTRAVENOUS | Status: DC
Start: 2016-12-27 — End: 2016-12-28
  Administered 2016-12-27: 3 mL via INTRAVENOUS

## 2016-12-27 MED ORDER — SODIUM CHLORIDE 0.9 % IV SOLN
INTRAVENOUS | Status: AC | PRN
  Administered 2016-12-27: 100 mL/h via INTRAVENOUS

## 2016-12-27 MED ORDER — DEXTROSE 5 % IV SOLN
1.5000 g | INTRAVENOUS | Status: AC
  Administered 2016-12-28: 1.5 g via INTRAVENOUS
  Administered 2016-12-28: .75 g via INTRAVENOUS
  Filled 2016-12-27: qty 1.5

## 2016-12-27 MED ORDER — HEPARIN SODIUM (PORCINE) 1000 UNIT/ML IJ SOLN
INTRAMUSCULAR | Status: AC
  Filled 2016-12-27: qty 1

## 2016-12-27 MED ORDER — PLASMA-LYTE 148 IV SOLN
INTRAVENOUS | Status: AC
  Administered 2016-12-28: 500 mL
  Filled 2016-12-27: qty 2.5

## 2016-12-27 MED ORDER — FENTANYL CITRATE (PF) 100 MCG/2ML IJ SOLN
INTRAMUSCULAR | Status: AC
  Filled 2016-12-27: qty 2

## 2016-12-27 MED ORDER — MIDAZOLAM HCL 2 MG/2ML IJ SOLN
INTRAMUSCULAR | Status: AC
  Filled 2016-12-27: qty 2

## 2016-12-27 MED ORDER — FENTANYL CITRATE (PF) 100 MCG/2ML IJ SOLN
INTRAMUSCULAR | Status: DC | PRN
  Administered 2016-12-27: 25 ug via INTRAVENOUS

## 2016-12-27 MED ORDER — MIDAZOLAM HCL 2 MG/2ML IJ SOLN
INTRAMUSCULAR | Status: DC | PRN
  Administered 2016-12-27: 1 mg via INTRAVENOUS

## 2016-12-27 MED ORDER — VERAPAMIL HCL 2.5 MG/ML IV SOLN
INTRAVENOUS | Status: DC | PRN
  Administered 2016-12-27: 10:00:00 via INTRA_ARTERIAL

## 2016-12-27 MED ORDER — EPINEPHRINE PF 1 MG/ML IJ SOLN
0.0000 ug/min | INTRAVENOUS | Status: DC
  Filled 2016-12-27: qty 4

## 2016-12-27 MED ORDER — IOPAMIDOL (ISOVUE-370) INJECTION 76%
INTRAVENOUS | Status: DC | PRN
  Administered 2016-12-27: 45 mL via INTRA_ARTERIAL

## 2016-12-27 MED ORDER — SODIUM CHLORIDE 0.9 % IV SOLN
30.0000 ug/min | INTRAVENOUS | Status: DC
  Filled 2016-12-27: qty 2

## 2016-12-27 MED ORDER — CHLORHEXIDINE GLUCONATE 0.12 % MT SOLN
15.0000 mL | Freq: Once | OROMUCOSAL | Status: AC
  Administered 2016-12-28: 15 mL via OROMUCOSAL
  Filled 2016-12-27: qty 15

## 2016-12-27 MED ORDER — HEPARIN (PORCINE) IN NACL 2-0.9 UNIT/ML-% IJ SOLN
INTRAMUSCULAR | Status: AC
  Filled 2016-12-27: qty 1000

## 2016-12-27 MED ORDER — CHLORHEXIDINE GLUCONATE 4 % EX LIQD
60.0000 mL | Freq: Once | CUTANEOUS | Status: AC
  Administered 2016-12-27: 4 via TOPICAL
  Filled 2016-12-27: qty 60

## 2016-12-27 MED ORDER — DEXTROSE 5 % IV SOLN
750.0000 mg | INTRAVENOUS | Status: DC
  Filled 2016-12-27: qty 750

## 2016-12-27 MED ORDER — SODIUM CHLORIDE 0.9 % IV SOLN
INTRAVENOUS | Status: DC
  Filled 2016-12-27: qty 30

## 2016-12-27 MED ORDER — TRANEXAMIC ACID 1000 MG/10ML IV SOLN
1.5000 mg/kg/h | INTRAVENOUS | Status: DC
  Filled 2016-12-27: qty 25

## 2016-12-27 SURGICAL SUPPLY — 14 items
CATH BALLN WEDGE 5F 110CM (CATHETERS) ×2 IMPLANT
CATH INFINITI 5 FR JL3.5 (CATHETERS) ×2 IMPLANT
CATH INFINITI JR4 5F (CATHETERS) ×2 IMPLANT
COVER PRB 48X5XTLSCP FOLD TPE (BAG) ×1 IMPLANT
COVER PROBE 5X48 (BAG) ×1
DEVICE RAD COMP TR BAND LRG (VASCULAR PRODUCTS) ×2 IMPLANT
GLIDESHEATH SLEND SS 6F .021 (SHEATH) ×4 IMPLANT
GUIDEWIRE INQWIRE 1.5J.035X260 (WIRE) ×1 IMPLANT
INQWIRE 1.5J .035X260CM (WIRE) ×2
KIT HEART LEFT (KITS) ×2 IMPLANT
PACK CARDIAC CATHETERIZATION (CUSTOM PROCEDURE TRAY) ×2 IMPLANT
SHEATH GLIDE SLENDER 4/5FR (SHEATH) ×2 IMPLANT
TRANSDUCER W/STOPCOCK (MISCELLANEOUS) ×2 IMPLANT
TUBING CIL FLEX 10 FLL-RA (TUBING) ×2 IMPLANT

## 2016-12-27 NOTE — Interval H&P Note (Signed)
History and Physical Interval Note:  12/27/2016 9:46 AM  Melissa [name]  has presented today for cardiac catheterization, with the diagnosis of left atrial mass. The various methods of treatment have been discussed with the patient and family. After consideration of risks, benefits and other options for treatment, the patient has consented to  Procedure(s): LEFT HEART CATH AND CORONARY ANGIOGRAPHY (N/A) as a surgical intervention .  The patient's history has been reviewed, patient examined, no change in status, stable for surgery.  I have reviewed the patient's chart and labs.  Questions were answered to the patient's satisfaction.    Cath Lab Visit (complete for each Cath Lab visit)  Clinical Evaluation Leading to the Procedure:   ACS: No.  Non-ACS:    Anginal Classification: CCS IV (Heart failure symptoms)  Anti-ischemic medical therapy: Minimal Therapy (1 class of medications)  Non-Invasive Test Results: No non-invasive testing performed  Prior CABG: No previous CABG  Kelbi Renstrom

## 2016-12-27 NOTE — H&P (View-Only) (Signed)
Progress Note  Patient Name: Melissa [name] Date of Encounter: 12/26/2016  Primary Cardiologist: End  Subjective   No complaints anxious about surgery   Inpatient Medications    Scheduled Meds: . aspirin EC  81 mg Oral Daily  . budesonide  0.25 mg Nebulization BID  . enoxaparin (LOVENOX) injection  40 mg Subcutaneous Q24H  . furosemide  20 mg Intravenous Daily  . loratadine  10 mg Oral Daily  . metoprolol tartrate  12.5 mg Oral BID  . traZODone  50 mg Oral QHS   Continuous Infusions:  PRN Meds: acetaminophen, albuterol, hydrOXYzine, Influenza vac split quadrivalent PF, nitroGLYCERIN, ondansetron (ZOFRAN) IV, tiZANidine   Vital Signs    Vitals:   12/25/16 1942 12/25/16 1949 12/26/16 0351 12/26/16 0716  BP:  115/73 96/64 113/78  Pulse:  71 (!) 57 (!) 59  Resp:  (!) 22 17 17   Temp:  97.6 F (36.4 C) (!) 97.5 F (36.4 C) (!) 97.5 F (36.4 C)  TempSrc:  Oral Oral Oral  SpO2: 98% 97% 97% 100%  Weight:   225 lb 14.4 oz (102.5 kg)   Height:        Intake/Output Summary (Last 24 hours) at 12/26/16 0754 Last data filed at 12/25/16 1826  Gross per 24 hour  Intake              600 ml  Output                0 ml  Net              600 ml   Filed Weights   12/24/16 1537 12/25/16 0335 12/26/16 0351  Weight: 229 lb 12.8 oz (104.2 kg) 227 lb 6.4 oz (103.1 kg) 225 lb 14.4 oz (102.5 kg)    Telemetry    NSR 12/26/2016  - Personally Reviewed  ECG    NSR no acute changes  - Personally Reviewed  Physical Exam  Previous bariatric surgery  Affect appropriate Healthy:  appears stated age HEENT: normal Neck supple with no adenopathy JVP normal no bruits no thyromegaly Lungs clear with no wheezing and good diaphragmatic motion Heart:  S1/S2 no murmur, no rub, gallop or click PMI normal Abdomen: benighn, BS positve, no tenderness, no AAA no bruit.  No HSM or HJR Distal pulses intact with no bruits No edema Neuro non-focal Skin warm and dry No muscular  weakness   Labs    Chemistry  Recent Labs Lab 12/24/16 1611 12/25/16 0254  NA 139 139  K 3.3* 4.1  CL 113* 110  CO2 21* 21*  GLUCOSE 94 89  BUN 9 12  CREATININE 0.63 0.63  CALCIUM 8.4* 8.2*  PROT 6.0* 5.5*  ALBUMIN 3.3* 3.0*  AST 51* 45*  ALT 59* 59*  ALKPHOS 71 64  BILITOT 0.7 0.6  GFRNONAA >60 >60  GFRAA >60 >60  ANIONGAP 5 8     Hematology  Recent Labs Lab 12/25/16 0254  WBC 8.3  RBC 4.09  HGB 10.2*  HCT 32.8*  MCV 80.2  MCH 24.9*  MCHC 31.1  RDW 16.4*  PLT 341    Cardiac Enzymes  Recent Labs Lab 12/24/16 1611  TROPONINI <0.03   No results for input(s): TROPIPOC in the last 168 hours.   BNP  Recent Labs Lab 12/24/16 1611  BNP 492.1*     DDimer No results for input(s): DDIMER in the last 168 hours.   Radiology    Dg Chest 2 View  Result  Date: 12/24/2016 CLINICAL DATA:  Mass in the atrium EXAM: CHEST  2 VIEW COMPARISON:  06/10/2015 FINDINGS: Small bilateral effusions. No focal consolidation. Mild cardiomegaly. Increased cardiac opacity with slight splaying of the carina. No pneumothorax IMPRESSION: 1. Small bilateral effusions 2. Mild cardiomegaly with left atrial enlargement. Electronically Signed   By: Donavan Foil M.D.   On: 12/24/2016 20:56   Ct Chest W Contrast  Result Date: 12/25/2016 CLINICAL DATA:  Atrial myxoma, dyspnea and asthma. EXAM: CT CHEST WITH CONTRAST TECHNIQUE: Multidetector CT imaging of the chest was performed during intravenous contrast administration. CONTRAST:  75 cc Isovue-300 IV COMPARISON:  None. FINDINGS: Cardiovascular: There is a soft tissue mass within the left atrium measuring 4 x 6.2 x 4.1 cm with its base along the interatrial septum and slightly prolapsing through mitral valve.Normal branch pattern of the great vessels. No aortic aneurysm or dissection. Preferential enhancement of the pulmonary arterial system. Dilatation of the main pulmonary artery consistent with a component of pulmonary hypertension is  seen up to 3.5 cm in caliber. There has also dilatation of the right and left main pulmonary artery is, measuring 3.4 cm on the right and 2 cm on the left. Coronary arteriosclerosis is noted predominant involving the LAD. Mediastinum/Nodes: Tiny too small to further characterize hypodensities within the thyroid without thyromegaly. Lungs/Pleura: Scarring and/or atelectasis is seen in the lingula in each lung base. No pneumonic consolidation, effusion or pneumothorax. No dominant mass. Upper Abdomen: Status post bariatric surgery. No acute upper abdominal abnormality. Musculoskeletal: Midthoracic kyphosis attributable to moderate disc space narrowing and endplate spurring. No acute nor suspicious osseous abnormality. IMPRESSION: 1. Intracavitary mass predominantly noted within the left atrium with slight protrusion through the mitral valve, possibly representing a ball valve effect, overall measuring 4 x 6.2 x 4.1 cm and consistent with reported history of atrial myxoma. 2. Dilatation of the main, right and left pulmonary arteries consistent with pulmonary hypertension. 3. Coronary arteriosclerosis. 4. Lingular bibasilar scarring and/or atelectasis. Electronically Signed   By: Ashley Royalty M.D.   On: 12/25/2016 19:46    Cardiac Studies   Echo with large LA cystic mass likely atypical myxoma  Patient Profile     51 y.o. female with 9 months progressive dyspnea Echo noted large LA myxoma with functional MS and pulmonary HTN  Seen by PVT for right and left cath Monday and surgery Tuesday or Wends day   Assessment & Plan    1) Myxoma: on beta blocker and lasix to decrease filling pressures and increase diastolic filling time. Cath in am with Dr End and possible surgery on Tuesday with PVT   Darius Bump and Gerald Stabs husband are personal friends of mine   For questions or updates, please contact [place] Please consult [email or web address] for contact info under Cardiology/STEMI.      Signed, Jenkins Rouge,  MD  12/26/2016, 7:54 AM

## 2016-12-27 NOTE — Progress Notes (Signed)
Progress Note  Patient Name: Melissa [name] Date of Encounter: 12/27/2016  Primary Cardiologist: Dr Johnsie Cancel  Subjective   She feel well today, no dizziness or chest pain.  Inpatient Medications    Scheduled Meds: . aspirin EC  81 mg Oral Daily  . budesonide  0.25 mg Nebulization BID  . enoxaparin (LOVENOX) injection  40 mg Subcutaneous Q24H  . furosemide  20 mg Intravenous Daily  . loratadine  10 mg Oral Daily  . metoprolol tartrate  12.5 mg Oral BID  . sodium chloride flush  3 mL Intravenous Q12H  . traZODone  50 mg Oral QHS   Continuous Infusions: . sodium chloride    . sodium chloride     PRN Meds: sodium chloride, acetaminophen, albuterol, hydrOXYzine, Influenza vac split quadrivalent PF, nitroGLYCERIN, ondansetron (ZOFRAN) IV, pramipexole, sodium chloride flush, tiZANidine   Vital Signs    Vitals:   12/26/16 0716 12/26/16 0836 12/26/16 1932 12/27/16 0428  BP: 113/78  121/68   Pulse: (!) 59  76   Resp: 17  (!) 24   Temp: (!) 97.5 F (36.4 C)  (!) 97.5 F (36.4 C) (!) 97.5 F (36.4 C)  TempSrc: Oral  Oral Oral  SpO2: 100% 99% 98%   Weight:    225 lb (102.1 kg)  Height:        Intake/Output Summary (Last 24 hours) at 12/27/16 0920 Last data filed at 12/26/16 1856  Gross per 24 hour  Intake              360 ml  Output                0 ml  Net              360 ml   Filed Weights   12/25/16 0335 12/26/16 0351 12/27/16 0428  Weight: 227 lb 6.4 oz (103.1 kg) 225 lb 14.4 oz (102.5 kg) 225 lb (102.1 kg)    Telemetry    SR - Personally Reviewed  ECG    SR - Personally Reviewed  Physical Exam   GEN: No acute distress.   Neck: No JVD Cardiac: RRR, no murmurs, rubs, or gallops.  Respiratory: mild crackles at bases bilaterally. GI: Soft, nontender, non-distended  MS: No edema; No deformity. Neuro:  Nonfocal  Psych: Normal affect   Labs    Chemistry Recent Labs Lab 12/24/16 1611 12/25/16 0254  NA 139 139  K 3.3* 4.1  CL 113* 110  CO2 21*  21*  GLUCOSE 94 89  BUN 9 12  CREATININE 0.63 0.63  CALCIUM 8.4* 8.2*  PROT 6.0* 5.5*  ALBUMIN 3.3* 3.0*  AST 51* 45*  ALT 59* 59*  ALKPHOS 71 64  BILITOT 0.7 0.6  GFRNONAA >60 >60  GFRAA >60 >60  ANIONGAP 5 8     Hematology Recent Labs Lab 12/25/16 0254  WBC 8.3  RBC 4.09  HGB 10.2*  HCT 32.8*  MCV 80.2  MCH 24.9*  MCHC 31.1  RDW 16.4*  PLT 341    Cardiac Enzymes Recent Labs Lab 12/24/16 1611  TROPONINI <0.03   No results for input(s): TROPIPOC in the last 168 hours.   BNP Recent Labs Lab 12/24/16 1611  BNP 492.1*     DDimer No results for input(s): DDIMER in the last 168 hours.   Radiology    Ct Chest W Contrast  Result Date: 12/25/2016 CLINICAL DATA:  Atrial myxoma, dyspnea and asthma. EXAM: CT CHEST WITH CONTRAST TECHNIQUE: Multidetector CT  imaging of the chest was performed during intravenous contrast administration. CONTRAST:  75 cc Isovue-300 IV COMPARISON:  None. FINDINGS: Cardiovascular: There is a soft tissue mass within the left atrium measuring 4 x 6.2 x 4.1 cm with its base along the interatrial septum and slightly prolapsing through mitral valve.Normal branch pattern of the great vessels. No aortic aneurysm or dissection. Preferential enhancement of the pulmonary arterial system. Dilatation of the main pulmonary artery consistent with a component of pulmonary hypertension is seen up to 3.5 cm in caliber. There has also dilatation of the right and left main pulmonary artery is, measuring 3.4 cm on the right and 2 cm on the left. Coronary arteriosclerosis is noted predominant involving the LAD. Mediastinum/Nodes: Tiny too small to further characterize hypodensities within the thyroid without thyromegaly. Lungs/Pleura: Scarring and/or atelectasis is seen in the lingula in each lung base. No pneumonic consolidation, effusion or pneumothorax. No dominant mass. Upper Abdomen: Status post bariatric surgery. No acute upper abdominal abnormality.  Musculoskeletal: Midthoracic kyphosis attributable to moderate disc space narrowing and endplate spurring. No acute nor suspicious osseous abnormality. IMPRESSION: 1. Intracavitary mass predominantly noted within the left atrium with slight protrusion through the mitral valve, possibly representing a ball valve effect, overall measuring 4 x 6.2 x 4.1 cm and consistent with reported history of atrial myxoma. 2. Dilatation of the main, right and left pulmonary arteries consistent with pulmonary hypertension. 3. Coronary arteriosclerosis. 4. Lingular bibasilar scarring and/or atelectasis. Electronically Signed   By: Ashley Royalty M.D.   On: 12/25/2016 19:46    Cardiac Studies   TTE: 10/5  - Left ventricle: The cavity size was normal. Systolic function was   normal. The estimated ejection fraction was in the range of 55%   to 60%. Wall motion was normal; there were no regional wall   motion abnormalities. The study is not technically sufficient to   allow evaluation of LV diastolic function. Doppler parameters are   consistent with high ventricular filling pressure. - Aortic valve: Transvalvular velocity was within the normal range.   There was no stenosis. There was no regurgitation. - Mitral valve: Transvalvular velocity was severely elevated. There   was moderate regurgitation. Mean gradient (D): 20 mm Hg. - Left atrium: The atrium was severely dilated. There was a large,   2.9 cm (L) x 4.5 cm (W) mass in the low atrial cavity that   obstructs mitral valve inflow. The mass is mostly hypoechoic   raising the possibility of a complex blood cyst. Findings are   less consistent with a myoxoma. It appears to attach to the low   right atrium, but cannot rule out attachement to the anterior   mitral valve leaflet. - Right ventricle: The cavity size was normal. Wall thickness was   normal. Systolic function was normal. - Atrial septum: No defect or patent foramen ovale was identified. - Tricuspid  valve: There was moderate regurgitation. - Pulmonary arteries: Systolic pressure was severely increased. PA   peak pressure: 89 mm Hg (S).  Impressions:  - Mitral valve gradients are severely elevated due to obstruction   by the left atrial mass. The mital valve appears otherwise normal   and unrestricted.  LHC: 10/8 1. Minimal atherosclerotic plaquing; no angiographically significant coronary artery disease. 2. Hypervascular, mobile structure supplied predominantly by the right coronary artery, consistent with left atrial mass seen on recent echo. 3. Moderately elevated left and right heart filling pressures. 4. Severe pulmonary hypertension. 5. Normal Fick cardiac output/index.  Recommendations:  1. Increase furosemide to 20 mg IV BID. 2. Proceed with workup and removal of left atrial mass, per Dr. Prescott Gum.  Patient Profile     51 y.o. female with 9 months progressive dyspnea Echo noted large LA myxoma with functional MS and pulmonary HTN  Seen by PVT for right and left cath Monday and surgery Tuesday or Wends day   Assessment & Plan      1) Myxoma: on beta blocker and lasix to decrease filling pressures and increase diastolic filling time. Cath in am with Dr End and possible surgery on Tuesday with PVT   2. Acute diastolic CHF - elevated filling pressures on right heart cath today. We will increase lasix to 20 mg iv BID.    For questions or updates, please contact [place] Please consult [email or web address] for contact info under Cardiology/STEMI.      Signed, [name], MD  12/27/2016, 9:20 AM

## 2016-12-27 NOTE — Progress Notes (Signed)
CARDIAC REHAB PHASE I   Pt in bed, getting ready to go for cath, declines ambulation at this time, is able to walk independently. Pre-op education completed previously, pt and husband state they have no questions at this time. Will follow post-op.   Lenna Sciara, RN, BSN 12/27/2016 9:28 AM

## 2016-12-27 NOTE — Progress Notes (Signed)
Pre-op Cardiac Surgery  Carotid Findings:  Bilateral carotid arteries 1-39% with moderate tortuosity of the left ICA.   Upper Extremity Right Left  Brachial Pressures Triphasic Triphasic/ 121  Radial Waveforms Triphasic Triphasic  Ulnar Waveforms Triphasic Triphasic  Palmar Arch (Allen's Test) WNL WNL    Lower  Extremity Right Left  Dorsalis Pedis Triphasic Triphasic  Anterior Tibial Triphasic  Triphasic  Posterior Tibial Triphasic Triphasic    Findings:  Normal triphasic waveforms demonstrated at the tibial vessels. Oda Cogan, BS, RDMS, RVT

## 2016-12-27 NOTE — Progress Notes (Signed)
Day of Surgery Procedure(s) (LRB): RIGHT HEART CATH AND CORONARY ANGIOGRAPHY (N/A) Subjective: Cath results reviewed and coronary angiogram images viewed No CAD, PA hypertension with obstruction of mitral valve by myxoma Plan resection of LA mass in am Procedure, benefits and risks d/w patient including need for transfusion, possible valve replacement, permanent pacemaker, stroke and death. She agrees to proceed with sugery in am  Objective: Vital signs in last 24 hours: Temp:  [97.5 F (36.4 C)-98.7 F (37.1 C)] 98.7 F (37.1 C) (10/08 1455) Pulse Rate:  [68-88] 68 (10/08 1455) Cardiac Rhythm: Normal sinus rhythm (10/08 0828) Resp:  [10-24] 15 (10/08 1500) BP: (91-125)/(56-80) 91/56 (10/08 1500) SpO2:  [94 %-100 %] 99 % (10/08 1500) Weight:  [225 lb (102.1 kg)] 225 lb (102.1 kg) (10/08 0428)  Hemodynamic parameters for last 24 hours:  nsr  Intake/Output from previous day: 10/07 0701 - 10/08 0700 In: 720 [P.O.:720] Out: -  Intake/Output this shift: No intake/output data recorded.       Exam    General- alert and comfortable   Lungs- clear without rales, wheezes   Cor- regular rate and rhythm, 2/6 TR murmur , no gallop   Abdomen- soft, non-tender   Extremities - warm, non-tender, minimal edema   Neuro- oriented, appropriate, no focal weakness   Lab Results:  Recent Labs  12/25/16 0254  WBC 8.3  HGB 10.2*  HCT 32.8*  PLT 341   BMET:  Recent Labs  12/25/16 0254  NA 139  K 4.1  CL 110  CO2 21*  GLUCOSE 89  BUN 12  CREATININE 0.63  CALCIUM 8.2*    PT/INR:  Recent Labs  12/25/16 0254  LABPROT 13.8  INR 1.07   ABG    Component Value Date/Time   PHART 7.427 12/27/2016 1705   HCO3 22.5 12/27/2016 1705   TCO2 23 12/27/2016 1022   ACIDBASEDEF 1.3 12/27/2016 1705   O2SAT 95.9 12/27/2016 1705   CBG (last 3)  No results for input(s): GLUCAP in the last 72 hours.  Assessment/Plan: S/P Procedure(s) (LRB): RIGHT HEART CATH AND CORONARY ANGIOGRAPHY  (N/A) Large LA myxoma Obesity s/p gastric lap band anemia Sternotomy for resection in am  LOS: 3 days    Tharon Aquas Trigt III 12/27/2016

## 2016-12-28 ENCOUNTER — Inpatient Hospital Stay (HOSPITAL_COMMUNITY): Payer: 59

## 2016-12-28 ENCOUNTER — Encounter (HOSPITAL_COMMUNITY)
Admission: AD | Disposition: A | Discharge status: Home / Self Care | Payer: Self-pay | Source: Ambulatory Visit | Attending: Cardiothoracic Surgery

## 2016-12-28 ENCOUNTER — Other Ambulatory Visit: Payer: Self-pay

## 2016-12-28 ENCOUNTER — Inpatient Hospital Stay (HOSPITAL_COMMUNITY): Payer: 59 | Admitting: Certified Registered Nurse Anesthetist

## 2016-12-28 DIAGNOSIS — I5189 Other ill-defined heart diseases: Secondary | ICD-10-CM | POA: Diagnosis present

## 2016-12-28 DIAGNOSIS — I519 Heart disease, unspecified: Secondary | ICD-10-CM

## 2016-12-28 HISTORY — PX: EXCISION OF ATRIAL MYXOMA: SHX5821

## 2016-12-28 HISTORY — PX: INTRAOPERATIVE TRANSESOPHAGEAL ECHOCARDIOGRAM: SHX5062

## 2016-12-28 LAB — POCT I-STAT, CHEM 8
BUN: 8 mg/dL (ref 6–20)
BUN: 7 mg/dL (ref 6–20)
BUN: 8 mg/dL (ref 6–20)
BUN: 7 mg/dL (ref 6–20)
BUN: 6 mg/dL (ref 6–20)
BUN: 7 mg/dL (ref 6–20)
BUN: 6 mg/dL (ref 6–20)
CALCIUM ION: 1.05 mmol/L — AB (ref 1.15–1.40)
CALCIUM ION: 1.18 mmol/L (ref 1.15–1.40)
CHLORIDE: 105 mmol/L (ref 101–111)
CREATININE: 0.4 mg/dL — AB (ref 0.44–1.00)
CREATININE: 0.4 mg/dL — AB (ref 0.44–1.00)
Calcium, Ion: 1.14 mmol/L — ABNORMAL LOW (ref 1.15–1.40)
Calcium, Ion: 1.01 mmol/L — ABNORMAL LOW (ref 1.15–1.40)
Calcium, Ion: 1.17 mmol/L (ref 1.15–1.40)
Calcium, Ion: 1.05 mmol/L — ABNORMAL LOW (ref 1.15–1.40)
Calcium, Ion: 1.06 mmol/L — ABNORMAL LOW (ref 1.15–1.40)
Chloride: 106 mmol/L (ref 101–111)
Chloride: 102 mmol/L (ref 101–111)
Chloride: 105 mmol/L (ref 101–111)
Chloride: 105 mmol/L (ref 101–111)
Chloride: 106 mmol/L (ref 101–111)
Chloride: 105 mmol/L (ref 101–111)
Creatinine, Ser: 0.3 mg/dL — ABNORMAL LOW (ref 0.44–1.00)
Creatinine, Ser: 0.3 mg/dL — ABNORMAL LOW (ref 0.44–1.00)
Creatinine, Ser: 0.3 mg/dL — ABNORMAL LOW (ref 0.44–1.00)
Creatinine, Ser: 0.3 mg/dL — ABNORMAL LOW (ref 0.44–1.00)
Creatinine, Ser: 0.4 mg/dL — ABNORMAL LOW (ref 0.44–1.00)
Glucose, Bld: 88 mg/dL (ref 65–99)
Glucose, Bld: 105 mg/dL — ABNORMAL HIGH (ref 65–99)
Glucose, Bld: 96 mg/dL (ref 65–99)
Glucose, Bld: 122 mg/dL — ABNORMAL HIGH (ref 65–99)
Glucose, Bld: 133 mg/dL — ABNORMAL HIGH (ref 65–99)
Glucose, Bld: 150 mg/dL — ABNORMAL HIGH (ref 65–99)
Glucose, Bld: 118 mg/dL — ABNORMAL HIGH (ref 65–99)
HCT: 23 % — ABNORMAL LOW (ref 36.0–46.0)
HCT: 30 % — ABNORMAL LOW (ref 36.0–46.0)
HEMATOCRIT: 29 % — AB (ref 36.0–46.0)
HEMATOCRIT: 24 % — AB (ref 36.0–46.0)
HEMATOCRIT: 27 % — AB (ref 36.0–46.0)
HEMATOCRIT: 27 % — AB (ref 36.0–46.0)
HEMATOCRIT: 26 % — AB (ref 36.0–46.0)
HEMOGLOBIN: 8.2 g/dL — AB (ref 12.0–15.0)
HEMOGLOBIN: 9.2 g/dL — AB (ref 12.0–15.0)
HEMOGLOBIN: 9.2 g/dL — AB (ref 12.0–15.0)
HEMOGLOBIN: 8.8 g/dL — AB (ref 12.0–15.0)
Hemoglobin: 9.9 g/dL — ABNORMAL LOW (ref 12.0–15.0)
Hemoglobin: 7.8 g/dL — ABNORMAL LOW (ref 12.0–15.0)
Hemoglobin: 10.2 g/dL — ABNORMAL LOW (ref 12.0–15.0)
POTASSIUM: 3.1 mmol/L — AB (ref 3.5–5.1)
POTASSIUM: 3.4 mmol/L — AB (ref 3.5–5.1)
POTASSIUM: 4 mmol/L (ref 3.5–5.1)
POTASSIUM: 4.1 mmol/L (ref 3.5–5.1)
Potassium: 3.3 mmol/L — ABNORMAL LOW (ref 3.5–5.1)
Potassium: 4.1 mmol/L (ref 3.5–5.1)
Potassium: 4.4 mmol/L (ref 3.5–5.1)
SODIUM: 142 mmol/L (ref 135–145)
SODIUM: 141 mmol/L (ref 135–145)
SODIUM: 140 mmol/L (ref 135–145)
SODIUM: 141 mmol/L (ref 135–145)
SODIUM: 141 mmol/L (ref 135–145)
Sodium: 141 mmol/L (ref 135–145)
Sodium: 140 mmol/L (ref 135–145)
TCO2: 25 mmol/L (ref 22–32)
TCO2: 28 mmol/L (ref 22–32)
TCO2: 28 mmol/L (ref 22–32)
TCO2: 27 mmol/L (ref 22–32)
TCO2: 26 mmol/L (ref 22–32)
TCO2: 26 mmol/L (ref 22–32)
TCO2: 24 mmol/L (ref 22–32)

## 2016-12-28 LAB — POCT I-STAT 3, ART BLOOD GAS (G3+)
ACID-BASE DEFICIT: 2 mmol/L (ref 0.0–2.0)
ACID-BASE DEFICIT: 2 mmol/L (ref 0.0–2.0)
Acid-Base Excess: 4 mmol/L — ABNORMAL HIGH (ref 0.0–2.0)
Acid-base deficit: 3 mmol/L — ABNORMAL HIGH (ref 0.0–2.0)
BICARBONATE: 29.2 mmol/L — AB (ref 20.0–28.0)
BICARBONATE: 23.2 mmol/L (ref 20.0–28.0)
BICARBONATE: 23.2 mmol/L (ref 20.0–28.0)
BICARBONATE: 22.8 mmol/L (ref 20.0–28.0)
Bicarbonate: 25.5 mmol/L (ref 20.0–28.0)
O2 SAT: 98 %
O2 Saturation: 100 %
O2 Saturation: 100 %
O2 Saturation: 99 %
O2 Saturation: 99 %
PCO2 ART: 44.1 mmHg (ref 32.0–48.0)
PCO2 ART: 43.5 mmHg (ref 32.0–48.0)
PCO2 ART: 39.7 mmHg (ref 32.0–48.0)
PH ART: 7.428 (ref 7.350–7.450)
PH ART: 7.371 (ref 7.350–7.450)
PH ART: 7.328 — AB (ref 7.350–7.450)
PO2 ART: 345 mmHg — AB (ref 83.0–108.0)
PO2 ART: 120 mmHg — AB (ref 83.0–108.0)
Patient temperature: 37.7
TCO2: 31 mmol/L (ref 22–32)
TCO2: 27 mmol/L (ref 22–32)
TCO2: 25 mmol/L (ref 22–32)
TCO2: 24 mmol/L (ref 22–32)
TCO2: 24 mmol/L (ref 22–32)
pCO2 arterial: 44.2 mmHg (ref 32.0–48.0)
pCO2 arterial: 41.2 mmHg (ref 32.0–48.0)
pH, Arterial: 7.36 (ref 7.350–7.450)
pH, Arterial: 7.371 (ref 7.350–7.450)
pO2, Arterial: 305 mmHg — ABNORMAL HIGH (ref 83.0–108.0)
pO2, Arterial: 124 mmHg — ABNORMAL HIGH (ref 83.0–108.0)
pO2, Arterial: 118 mmHg — ABNORMAL HIGH (ref 83.0–108.0)

## 2016-12-28 LAB — CREATININE, SERUM
Creatinine, Ser: 0.42 mg/dL — ABNORMAL LOW (ref 0.44–1.00)
GFR calc Af Amer: >60 mL/min (ref >60)
GFR calc non Af Amer: >60 mL/min (ref >60)

## 2016-12-28 LAB — BLOOD GAS, ARTERIAL
Acid-base deficit: 1.3 mmol/L (ref 0.0–2.0)
Bicarbonate: 22.5 mmol/L (ref 20.0–28.0)
Drawn by: 275531
O2 Saturation: 95.9 %
Patient temperature: 98.6
pCO2 arterial: 34.7 mmHg (ref 32.0–48.0)
pH, Arterial: 7.427 (ref 7.350–7.450)
pO2, Arterial: 92 mmHg (ref 83.0–108.0)

## 2016-12-28 LAB — CBC
HCT: 34.8 % — ABNORMAL LOW (ref 36.0–46.0)
HCT: 32 % — ABNORMAL LOW (ref 36.0–46.0)
HEMATOCRIT: 31.7 % — AB (ref 36.0–46.0)
Hemoglobin: 10.7 g/dL — ABNORMAL LOW (ref 12.0–15.0)
Hemoglobin: 9.9 g/dL — ABNORMAL LOW (ref 12.0–15.0)
Hemoglobin: 10 g/dL — ABNORMAL LOW (ref 12.0–15.0)
MCH: 24.5 pg — ABNORMAL LOW (ref 26.0–34.0)
MCH: 25.1 pg — AB (ref 26.0–34.0)
MCH: 24.8 pg — ABNORMAL LOW (ref 26.0–34.0)
MCHC: 30.7 g/dL (ref 30.0–36.0)
MCHC: 31.2 g/dL (ref 30.0–36.0)
MCHC: 31.3 g/dL (ref 30.0–36.0)
MCV: 79.6 fL (ref 78.0–100.0)
MCV: 80.3 fL (ref 78.0–100.0)
MCV: 79.4 fL (ref 78.0–100.0)
Platelets: 315 10*3/uL (ref 150–400)
Platelets: 205 10*3/uL (ref 150–400)
Platelets: 232 10*3/uL (ref 150–400)
RBC: 4.37 MIL/uL (ref 3.87–5.11)
RBC: 3.95 MIL/uL (ref 3.87–5.11)
RBC: 4.03 MIL/uL (ref 3.87–5.11)
RDW: 16.4 % — ABNORMAL HIGH (ref 11.5–15.5)
RDW: 16 % — ABNORMAL HIGH (ref 11.5–15.5)
RDW: 15.8 % — ABNORMAL HIGH (ref 11.5–15.5)
WBC: 6.8 10*3/uL (ref 4.0–10.5)
WBC: 16 10*3/uL — AB (ref 4.0–10.5)
WBC: 14.2 10*3/uL — ABNORMAL HIGH (ref 4.0–10.5)

## 2016-12-28 LAB — GLUCOSE, CAPILLARY
GLUCOSE-CAPILLARY: 116 mg/dL — AB (ref 65–99)
GLUCOSE-CAPILLARY: 92 mg/dL (ref 65–99)
GLUCOSE-CAPILLARY: 110 mg/dL — AB (ref 65–99)
GLUCOSE-CAPILLARY: 115 mg/dL — AB (ref 65–99)
Glucose-Capillary: 112 mg/dL — ABNORMAL HIGH (ref 65–99)
Glucose-Capillary: 104 mg/dL — ABNORMAL HIGH (ref 65–99)
Glucose-Capillary: 109 mg/dL — ABNORMAL HIGH (ref 65–99)
Glucose-Capillary: 142 mg/dL — ABNORMAL HIGH (ref 65–99)
Glucose-Capillary: 125 mg/dL — ABNORMAL HIGH (ref 65–99)

## 2016-12-28 LAB — APTT: aPTT: 30 seconds (ref 24–36)

## 2016-12-28 LAB — BASIC METABOLIC PANEL
Anion gap: 10 (ref 5–15)
BUN: 9 mg/dL (ref 6–20)
CO2: 24 mmol/L (ref 22–32)
Calcium: 8.6 mg/dL — ABNORMAL LOW (ref 8.9–10.3)
Chloride: 105 mmol/L (ref 101–111)
Creatinine, Ser: 0.57 mg/dL (ref 0.44–1.00)
GFR calc Af Amer: >60 mL/min (ref >60)
GFR calc non Af Amer: >60 mL/min (ref >60)
Glucose, Bld: 85 mg/dL (ref 65–99)
Potassium: 3.4 mmol/L — ABNORMAL LOW (ref 3.5–5.1)
Sodium: 139 mmol/L (ref 135–145)

## 2016-12-28 LAB — PROTIME-INR
INR: 1.37
Prothrombin Time: 16.7 seconds — ABNORMAL HIGH (ref 11.4–15.2)

## 2016-12-28 LAB — HEMOGLOBIN AND HEMATOCRIT, BLOOD
HCT: 27.5 % — ABNORMAL LOW (ref 36.0–46.0)
Hemoglobin: 8.7 g/dL — ABNORMAL LOW (ref 12.0–15.0)

## 2016-12-28 LAB — VAS US DOPPLER PRE CABG
LEFT ECA DIAS: -11 cm/s
LEFT VERTEBRAL DIAS: -17 cm/s
Left CCA dist dias: -25 cm/s
Left CCA dist sys: -123 cm/s
Left CCA prox dias: 25 cm/s
Left CCA prox sys: 139 cm/s
Left ICA dist dias: -36 cm/s
Left ICA dist sys: -133 cm/s
Left ICA prox dias: -27 cm/s
Left ICA prox sys: -88 cm/s
RIGHT ECA DIAS: -9 cm/s
RIGHT VERTEBRAL DIAS: -22 cm/s
Right CCA prox dias: 27 cm/s
Right CCA prox sys: 135 cm/s
Right cca dist sys: -111 cm/s

## 2016-12-28 LAB — POCT I-STAT 4, (NA,K, GLUC, HGB,HCT)
Glucose, Bld: 125 mg/dL — ABNORMAL HIGH (ref 65–99)
HCT: 29 % — ABNORMAL LOW (ref 36.0–46.0)
HEMOGLOBIN: 9.9 g/dL — AB (ref 12.0–15.0)
Potassium: 3.7 mmol/L (ref 3.5–5.1)
Sodium: 145 mmol/L (ref 135–145)

## 2016-12-28 LAB — PLATELET COUNT: Platelets: 216 10*3/uL (ref 150–400)

## 2016-12-28 LAB — PREPARE RBC (CROSSMATCH)

## 2016-12-28 LAB — MAGNESIUM: Magnesium: 3 mg/dL — ABNORMAL HIGH (ref 1.7–2.4)

## 2016-12-28 SURGERY — EXCISION, MYXOMA, CARDIAC ATRIUM
Anesthesia: General | Site: Chest

## 2016-12-28 MED ORDER — ALBUMIN HUMAN 5 % IV SOLN
INTRAVENOUS | Status: DC | PRN
  Administered 2016-12-28: 12:00:00 via INTRAVENOUS

## 2016-12-28 MED ORDER — FAMOTIDINE IN NACL 20-0.9 MG/50ML-% IV SOLN
20.0000 mg | Freq: Two times a day (BID) | INTRAVENOUS | Status: AC
Start: 2016-12-28 — End: 2016-12-28
  Administered 2016-12-28 (×2): 20 mg via INTRAVENOUS
  Filled 2016-12-28: qty 50

## 2016-12-28 MED ORDER — HYDROXYZINE HCL 10 MG PO TABS
10.0000 mg | ORAL_TABLET | Freq: Three times a day (TID) | ORAL | Status: DC | PRN
  Filled 2016-12-28: qty 1

## 2016-12-28 MED ORDER — MORPHINE SULFATE (PF) 4 MG/ML IV SOLN
1.0000 mg | INTRAVENOUS | Status: DC | PRN
  Administered 2016-12-28: 2 mg via INTRAVENOUS

## 2016-12-28 MED ORDER — ACETAMINOPHEN 160 MG/5ML PO SOLN: 650.0000 mg | Freq: Once | ORAL | Status: AC

## 2016-12-28 MED ORDER — ASPIRIN EC 325 MG PO TBEC
325.0000 mg | DELAYED_RELEASE_TABLET | Freq: Every day | ORAL | Status: DC
  Administered 2016-12-29 – 2017-01-01 (×4): 325 mg via ORAL
  Filled 2016-12-28 (×4): qty 1

## 2016-12-28 MED ORDER — METOPROLOL TARTRATE 12.5 MG HALF TABLET
12.5000 mg | ORAL_TABLET | Freq: Two times a day (BID) | ORAL | Status: DC
  Administered 2016-12-29 – 2017-01-01 (×6): 12.5 mg via ORAL
  Filled 2016-12-28 (×6): qty 1

## 2016-12-28 MED ORDER — LACTATED RINGERS IV SOLN: INTRAVENOUS | Status: DC

## 2016-12-28 MED ORDER — ACETAMINOPHEN 650 MG RE SUPP
650.0000 mg | Freq: Once | RECTAL | Status: AC
  Administered 2016-12-28: 650 mg via RECTAL

## 2016-12-28 MED ORDER — FENTANYL CITRATE (PF) 250 MCG/5ML IJ SOLN
INTRAMUSCULAR | Status: DC | PRN
  Administered 2016-12-28: 50 ug via INTRAVENOUS
  Administered 2016-12-28 (×2): 250 ug via INTRAVENOUS
  Administered 2016-12-28: 200 ug via INTRAVENOUS
  Administered 2016-12-28: 250 ug via INTRAVENOUS

## 2016-12-28 MED ORDER — SODIUM CHLORIDE 0.9 % IV SOLN: Freq: Once | INTRAVENOUS | Status: DC

## 2016-12-28 MED ORDER — BISACODYL 5 MG PO TBEC
10.0000 mg | DELAYED_RELEASE_TABLET | Freq: Every day | ORAL | Status: DC
  Administered 2016-12-29 – 2016-12-30 (×2): 10 mg via ORAL
  Filled 2016-12-28 (×2): qty 2

## 2016-12-28 MED ORDER — TRAMADOL HCL 50 MG PO TABS
50.0000 mg | ORAL_TABLET | ORAL | Status: DC | PRN
  Administered 2016-12-29 (×2): 100 mg via ORAL
  Filled 2016-12-28 (×2): qty 2

## 2016-12-28 MED ORDER — DOPAMINE-DEXTROSE 3.2-5 MG/ML-% IV SOLN
0.0000 ug/kg/min | INTRAVENOUS | Status: DC
  Administered 2016-12-29: 3 ug/kg/min via INTRAVENOUS
  Filled 2016-12-28 (×2): qty 250

## 2016-12-28 MED ORDER — HEPARIN SODIUM (PORCINE) 1000 UNIT/ML IJ SOLN
INTRAMUSCULAR | Status: AC
  Filled 2016-12-28: qty 1

## 2016-12-28 MED ORDER — ASPIRIN 81 MG PO CHEW: 324.0000 mg | CHEWABLE_TABLET | Freq: Every day | ORAL | Status: DC

## 2016-12-28 MED ORDER — PRAMIPEXOLE DIHYDROCHLORIDE 0.25 MG PO TABS
1.0000 mg | ORAL_TABLET | Freq: Two times a day (BID) | ORAL | Status: DC | PRN
  Filled 2016-12-28: qty 1

## 2016-12-28 MED ORDER — MILRINONE LACTATE IN DEXTROSE 20-5 MG/100ML-% IV SOLN
0.1250 ug/kg/min | INTRAVENOUS | Status: AC
  Administered 2016-12-28: 0.25 ug/kg/min via INTRAVENOUS
  Filled 2016-12-28: qty 100

## 2016-12-28 MED ORDER — METOCLOPRAMIDE HCL 5 MG/ML IJ SOLN
10.0000 mg | Freq: Four times a day (QID) | INTRAMUSCULAR | Status: DC
  Administered 2016-12-28 – 2016-12-31 (×11): 10 mg via INTRAVENOUS
  Filled 2016-12-28 (×10): qty 2

## 2016-12-28 MED ORDER — LACTATED RINGERS IV SOLN
INTRAVENOUS | Status: DC | PRN
  Administered 2016-12-28 (×2): via INTRAVENOUS

## 2016-12-28 MED ORDER — PROPOFOL 10 MG/ML IV BOLUS
INTRAVENOUS | Status: DC | PRN
  Administered 2016-12-28: 70 mg via INTRAVENOUS

## 2016-12-28 MED ORDER — ACETAMINOPHEN 500 MG PO TABS
1000.0000 mg | ORAL_TABLET | Freq: Four times a day (QID) | ORAL | Status: DC
  Administered 2016-12-28 – 2017-01-02 (×15): 1000 mg via ORAL
  Filled 2016-12-28 (×15): qty 2

## 2016-12-28 MED ORDER — VANCOMYCIN HCL IN DEXTROSE 1-5 GM/200ML-% IV SOLN
1000.0000 mg | Freq: Two times a day (BID) | INTRAVENOUS | Status: AC
  Administered 2016-12-28 – 2016-12-29 (×2): 1000 mg via INTRAVENOUS
  Filled 2016-12-28 (×3): qty 200

## 2016-12-28 MED ORDER — LACTATED RINGERS IV SOLN
INTRAVENOUS | Status: DC | PRN
  Administered 2016-12-28: 08:00:00 via INTRAVENOUS

## 2016-12-28 MED ORDER — TRANEXAMIC ACID 1000 MG/10ML IV SOLN
INTRAVENOUS | Status: DC | PRN
  Administered 2016-12-28: 1.5 mg/kg/h via INTRAVENOUS

## 2016-12-28 MED ORDER — LIDOCAINE HCL (PF) 2 % IJ SOLN: 100.0000 mg | Freq: Once | INTRAMUSCULAR | Status: DC

## 2016-12-28 MED ORDER — MILRINONE LACTATE IN DEXTROSE 20-5 MG/100ML-% IV SOLN
0.1250 ug/kg/min | INTRAVENOUS | Status: DC
  Administered 2016-12-28: 0.25 ug/kg/min via INTRAVENOUS
  Administered 2016-12-29 (×2): 0.125 ug/kg/min via INTRAVENOUS
  Filled 2016-12-28 (×3): qty 100

## 2016-12-28 MED ORDER — LIDOCAINE HCL (CARDIAC) 20 MG/ML IV SOLN
INTRAVENOUS | Status: DC | PRN
  Administered 2016-12-28: 50 mg via INTRAVENOUS

## 2016-12-28 MED ORDER — SODIUM CHLORIDE 0.9 % IV SOLN
INTRAVENOUS | Status: DC | PRN
  Administered 2016-12-28: 1 [IU]/h via INTRAVENOUS

## 2016-12-28 MED ORDER — 0.9 % SODIUM CHLORIDE (POUR BTL) OPTIME
TOPICAL | Status: DC | PRN
  Administered 2016-12-28: 6000 mL

## 2016-12-28 MED ORDER — METOPROLOL TARTRATE 5 MG/5ML IV SOLN: 2.5000 mg | INTRAVENOUS | Status: DC | PRN

## 2016-12-28 MED ORDER — SODIUM CHLORIDE 0.9% FLUSH
3.0000 mL | Freq: Two times a day (BID) | INTRAVENOUS | Status: DC
  Administered 2016-12-29 – 2017-01-01 (×6): 3 mL via INTRAVENOUS

## 2016-12-28 MED ORDER — INSULIN REGULAR BOLUS VIA INFUSION
0.0000 [IU] | Freq: Three times a day (TID) | INTRAVENOUS | Status: DC
  Filled 2016-12-28: qty 10

## 2016-12-28 MED ORDER — PROTAMINE SULFATE 10 MG/ML IV SOLN
INTRAVENOUS | Status: DC | PRN
  Administered 2016-12-28 (×3): 50 mg via INTRAVENOUS
  Administered 2016-12-28: 30 mg via INTRAVENOUS
  Administered 2016-12-28 (×2): 50 mg via INTRAVENOUS

## 2016-12-28 MED ORDER — SODIUM CHLORIDE 0.45 % IV SOLN: INTRAVENOUS | Status: DC | PRN

## 2016-12-28 MED ORDER — PANTOPRAZOLE SODIUM 40 MG PO TBEC
40.0000 mg | DELAYED_RELEASE_TABLET | Freq: Every day | ORAL | Status: DC
  Administered 2016-12-30 – 2017-01-01 (×3): 40 mg via ORAL
  Filled 2016-12-28 (×3): qty 1

## 2016-12-28 MED ORDER — VANCOMYCIN HCL IN DEXTROSE 1-5 GM/200ML-% IV SOLN: 1000.0000 mg | Freq: Once | INTRAVENOUS | Status: DC

## 2016-12-28 MED ORDER — DOPAMINE-DEXTROSE 3.2-5 MG/ML-% IV SOLN
INTRAVENOUS | Status: DC | PRN
  Administered 2016-12-28: 3 ug/kg/min via INTRAVENOUS

## 2016-12-28 MED ORDER — OXYCODONE HCL 5 MG PO TABS
5.0000 mg | ORAL_TABLET | ORAL | Status: DC | PRN
  Administered 2016-12-28 – 2016-12-29 (×4): 10 mg via ORAL
  Filled 2016-12-28 (×4): qty 2

## 2016-12-28 MED ORDER — ONDANSETRON HCL 4 MG/2ML IJ SOLN: 4.0000 mg | Freq: Four times a day (QID) | INTRAMUSCULAR | Status: DC | PRN

## 2016-12-28 MED ORDER — SODIUM CHLORIDE 0.9 % IV SOLN
INTRAVENOUS | Status: DC | PRN
  Administered 2016-12-28: 0.3 ug/kg/h via INTRAVENOUS

## 2016-12-28 MED ORDER — MAGNESIUM SULFATE 4 GM/100ML IV SOLN
4.0000 g | Freq: Once | INTRAVENOUS | Status: AC
  Administered 2016-12-28: 4 g via INTRAVENOUS
  Filled 2016-12-28: qty 100

## 2016-12-28 MED ORDER — SODIUM CHLORIDE 0.9 % IV SOLN
0.0000 ug/min | INTRAVENOUS | Status: DC
  Administered 2016-12-28: 70 ug/min via INTRAVENOUS
  Administered 2016-12-29 (×2): 65 ug/min via INTRAVENOUS
  Filled 2016-12-28 (×5): qty 2

## 2016-12-28 MED ORDER — PHENYLEPHRINE HCL 10 MG/ML IJ SOLN
INTRAVENOUS | Status: DC | PRN
  Administered 2016-12-28: 20 ug/min via INTRAVENOUS

## 2016-12-28 MED ORDER — METOPROLOL TARTRATE 25 MG/10 ML ORAL SUSPENSION: 12.5000 mg | Freq: Two times a day (BID) | ORAL | Status: DC

## 2016-12-28 MED ORDER — ORAL CARE MOUTH RINSE
15.0000 mL | OROMUCOSAL | Status: DC
  Administered 2016-12-28 (×2): 15 mL via OROMUCOSAL

## 2016-12-28 MED ORDER — PROPOFOL 10 MG/ML IV BOLUS
INTRAVENOUS | Status: AC
  Filled 2016-12-28: qty 40

## 2016-12-28 MED ORDER — THROMBIN 5000 UNITS EX SOLR
CUTANEOUS | Status: DC | PRN
  Administered 2016-12-28: 5000 [IU] via TOPICAL

## 2016-12-28 MED ORDER — NITROGLYCERIN IN D5W 200-5 MCG/ML-% IV SOLN: 0.0000 ug/min | INTRAVENOUS | Status: DC

## 2016-12-28 MED ORDER — MIDAZOLAM HCL 2 MG/2ML IJ SOLN
INTRAMUSCULAR | Status: AC
  Administered 2016-12-28: 2 mg via INTRAVENOUS
  Filled 2016-12-28: qty 2

## 2016-12-28 MED ORDER — TIZANIDINE HCL 2 MG PO TABS
2.0000 mg | ORAL_TABLET | Freq: Four times a day (QID) | ORAL | Status: DC | PRN
  Administered 2016-12-30: 2 mg via ORAL
  Filled 2016-12-28 (×3): qty 1

## 2016-12-28 MED ORDER — CHLORHEXIDINE GLUCONATE 0.12 % MT SOLN
15.0000 mL | OROMUCOSAL | Status: AC
  Administered 2016-12-28: 15 mL via OROMUCOSAL

## 2016-12-28 MED ORDER — SODIUM CHLORIDE 0.9 % IV SOLN: INTRAVENOUS | Status: DC

## 2016-12-28 MED ORDER — SODIUM CHLORIDE 0.9 % IV SOLN
20.0000 ug | Freq: Once | INTRAVENOUS | Status: AC
  Administered 2016-12-28: 20 ug via INTRAVENOUS
  Filled 2016-12-28: qty 5

## 2016-12-28 MED ORDER — SODIUM CHLORIDE 0.9 % IJ SOLN
OROMUCOSAL | Status: DC | PRN
  Administered 2016-12-28 (×5): 4 mL via TOPICAL

## 2016-12-28 MED ORDER — POTASSIUM CHLORIDE 10 MEQ/50ML IV SOLN
10.0000 meq | Freq: Once | INTRAVENOUS | Status: AC
  Administered 2016-12-28: 10 meq via INTRAVENOUS
  Filled 2016-12-28: qty 50

## 2016-12-28 MED ORDER — ACETAMINOPHEN 160 MG/5ML PO SOLN: 1000.0000 mg | Freq: Four times a day (QID) | ORAL | Status: DC

## 2016-12-28 MED ORDER — ALBUMIN HUMAN 5 % IV SOLN
250.0000 mL | INTRAVENOUS | Status: AC | PRN
  Administered 2016-12-28 (×3): 250 mL via INTRAVENOUS
  Filled 2016-12-28: qty 250

## 2016-12-28 MED ORDER — HEMOSTATIC AGENTS (NO CHARGE) OPTIME
TOPICAL | Status: DC | PRN
  Administered 2016-12-28 (×2): 1 via TOPICAL

## 2016-12-28 MED ORDER — POTASSIUM CHLORIDE 10 MEQ/50ML IV SOLN
10.0000 meq | INTRAVENOUS | Status: AC
  Administered 2016-12-28 (×3): 10 meq via INTRAVENOUS

## 2016-12-28 MED ORDER — LIDOCAINE HCL (CARDIAC) 20 MG/ML IV SOLN
100.0000 mg | Freq: Once | INTRAVENOUS | Status: AC
  Administered 2016-12-28: 100 mg via INTRAVENOUS

## 2016-12-28 MED ORDER — MIDAZOLAM HCL 10 MG/2ML IJ SOLN
INTRAMUSCULAR | Status: AC
  Filled 2016-12-28: qty 2

## 2016-12-28 MED ORDER — THROMBIN 5000 UNITS EX SOLR
CUTANEOUS | Status: AC
  Filled 2016-12-28: qty 5000

## 2016-12-28 MED ORDER — CALCIUM CHLORIDE 10 % IV SOLN
1.0000 g | Freq: Once | INTRAVENOUS | Status: AC
  Administered 2016-12-28: 1 g via INTRAVENOUS

## 2016-12-28 MED ORDER — ROCURONIUM BROMIDE 100 MG/10ML IV SOLN
INTRAVENOUS | Status: DC | PRN
  Administered 2016-12-28: 60 mg via INTRAVENOUS
  Administered 2016-12-28: 80 mg via INTRAVENOUS
  Administered 2016-12-28: 60 mg via INTRAVENOUS

## 2016-12-28 MED ORDER — CHLORHEXIDINE GLUCONATE 0.12% ORAL RINSE (MEDLINE KIT)
15.0000 mL | Freq: Two times a day (BID) | OROMUCOSAL | Status: DC
  Administered 2016-12-28: 15 mL via OROMUCOSAL

## 2016-12-28 MED ORDER — DOCUSATE SODIUM 100 MG PO CAPS
200.0000 mg | ORAL_CAPSULE | Freq: Every day | ORAL | Status: DC
  Administered 2016-12-29 – 2016-12-30 (×2): 200 mg via ORAL
  Filled 2016-12-28 (×3): qty 2

## 2016-12-28 MED ORDER — MIDAZOLAM HCL 5 MG/5ML IJ SOLN
INTRAMUSCULAR | Status: DC | PRN
  Administered 2016-12-28 (×2): 2 mg via INTRAVENOUS
  Administered 2016-12-28: 4 mg via INTRAVENOUS
  Administered 2016-12-28: 2 mg via INTRAVENOUS

## 2016-12-28 MED ORDER — LACTATED RINGERS IV SOLN: 500.0000 mL | Freq: Once | INTRAVENOUS | Status: DC | PRN

## 2016-12-28 MED ORDER — MORPHINE SULFATE (PF) 2 MG/ML IV SOLN: 2.0000 mg | INTRAVENOUS | Status: DC | PRN

## 2016-12-28 MED ORDER — BISACODYL 10 MG RE SUPP: 10.0000 mg | Freq: Every day | RECTAL | Status: DC

## 2016-12-28 MED ORDER — MORPHINE SULFATE (PF) 4 MG/ML IV SOLN
2.0000 mg | INTRAVENOUS | Status: DC | PRN
  Administered 2016-12-28 (×3): 4 mg via INTRAVENOUS
  Administered 2016-12-28: 2 mg via INTRAVENOUS
  Administered 2016-12-29 (×5): 4 mg via INTRAVENOUS
  Filled 2016-12-28 (×11): qty 1

## 2016-12-28 MED ORDER — MORPHINE SULFATE (PF) 2 MG/ML IV SOLN: 1.0000 mg | INTRAVENOUS | Status: DC | PRN

## 2016-12-28 MED ORDER — SODIUM CHLORIDE 0.9 % IV SOLN
0.0000 ug/kg/h | INTRAVENOUS | Status: DC
  Administered 2016-12-28: 0.2 ug/kg/h via INTRAVENOUS
  Filled 2016-12-28: qty 2

## 2016-12-28 MED ORDER — DEXTROSE 5 % IV SOLN
1.5000 g | Freq: Two times a day (BID) | INTRAVENOUS | Status: AC
  Administered 2016-12-28 – 2016-12-30 (×4): 1.5 g via INTRAVENOUS
  Filled 2016-12-28 (×4): qty 1.5

## 2016-12-28 MED ORDER — FENTANYL CITRATE (PF) 250 MCG/5ML IJ SOLN
INTRAMUSCULAR | Status: AC
Start: 2016-12-28 — End: ?
  Filled 2016-12-28: qty 20

## 2016-12-28 MED ORDER — HEPARIN SODIUM (PORCINE) 1000 UNIT/ML IJ SOLN
INTRAMUSCULAR | Status: DC | PRN
  Administered 2016-12-28: 31 mL via INTRAVENOUS

## 2016-12-28 MED ORDER — SODIUM CHLORIDE 0.9 % IV SOLN: 250.0000 mL | INTRAVENOUS | Status: DC

## 2016-12-28 MED ORDER — INSULIN ASPART 100 UNIT/ML ~~LOC~~ SOLN
0.0000 [IU] | SUBCUTANEOUS | Status: DC
  Administered 2016-12-28 – 2016-12-29 (×2): 2 [IU] via SUBCUTANEOUS

## 2016-12-28 MED ORDER — SODIUM CHLORIDE 0.9 % IV SOLN
INTRAVENOUS | Status: DC
  Filled 2016-12-28: qty 1

## 2016-12-28 MED ORDER — GLUTARALDEHYDE 0.625% SOAKING SOLUTION
TOPICAL | Status: AC
  Administered 2016-12-28: 1 via TOPICAL
  Filled 2016-12-28 (×2): qty 50

## 2016-12-28 MED ORDER — MIDAZOLAM HCL 2 MG/2ML IJ SOLN
2.0000 mg | INTRAMUSCULAR | Status: DC | PRN
  Administered 2016-12-28: 2 mg via INTRAVENOUS

## 2016-12-28 MED ORDER — ALBUTEROL SULFATE (2.5 MG/3ML) 0.083% IN NEBU: 2.5000 mg | INHALATION_SOLUTION | Freq: Four times a day (QID) | RESPIRATORY_TRACT | Status: DC | PRN

## 2016-12-28 MED ORDER — TRAZODONE HCL 50 MG PO TABS
50.0000 mg | ORAL_TABLET | Freq: Every day | ORAL | Status: DC
  Administered 2016-12-29 – 2017-01-01 (×4): 50 mg via ORAL
  Filled 2016-12-28 (×4): qty 1

## 2016-12-28 MED ORDER — SODIUM CHLORIDE 0.9% FLUSH: 3.0000 mL | INTRAVENOUS | Status: DC | PRN

## 2016-12-28 MED FILL — Heparin Sodium (Porcine) 2 Unit/ML in Sodium Chloride 0.9%: INTRAMUSCULAR | Qty: 1000 | Status: AC

## 2016-12-28 MED FILL — Lidocaine HCl Local Inj 2%: INTRAMUSCULAR | Qty: 10 | Status: AC

## 2016-12-28 SURGICAL SUPPLY — 101 items
ADAPTER CARDIO PERF ANTE/RETRO (ADAPTER) ×4 IMPLANT
APPLICATOR COTTON TIP 6IN STRL (MISCELLANEOUS) IMPLANT
BAG DECANTER FOR FLEXI CONT (MISCELLANEOUS) ×4 IMPLANT
BLADE STERNUM SYSTEM 6 (BLADE) ×4 IMPLANT
BLADE SURG 12 STRL SS (BLADE) ×4 IMPLANT
BLADE SURG 15 STRL LF DISP TIS (BLADE) ×2 IMPLANT
BLADE SURG 15 STRL SS (BLADE) ×2
BOOT SUTURE AID YELLOW STND (SUTURE) IMPLANT
CANISTER SUCT 3000ML PPV (MISCELLANEOUS) ×4 IMPLANT
CANN PRFSN 3/8XCNCT ST RT ANG (MISCELLANEOUS) ×2
CANN PRFSN 3/8XRT ANG TPR 14 (MISCELLANEOUS) ×2
CANNULA GUNDRY RCSP 15FR (MISCELLANEOUS) ×4 IMPLANT
CANNULA PRFSN 3/8XCNCT RT ANG (MISCELLANEOUS) ×2 IMPLANT
CANNULA PRFSN 3/8XRT ANG TPR14 (MISCELLANEOUS) ×2 IMPLANT
CANNULA SUMP PERICARDIAL (CANNULA) ×4 IMPLANT
CANNULA VEN MTL TIP RT (MISCELLANEOUS) ×4
CANNULA VRC MALB SNGL STG 28FR (MISCELLANEOUS) ×2 IMPLANT
CATH ROBINSON RED A/P 18FR (CATHETERS) ×16 IMPLANT
CATH THORACIC 36FR RT ANG (CATHETERS) ×4 IMPLANT
CLIP VESOCCLUDE MED 24/CT (CLIP) ×4 IMPLANT
CLIP VESOCCLUDE SM WIDE 24/CT (CLIP) ×4 IMPLANT
CONN 1/2X1/2X1/2  BEN (MISCELLANEOUS) ×2
CONN 1/2X1/2X1/2 BEN (MISCELLANEOUS) ×2 IMPLANT
CONN 3/8X1/2 ST GISH (MISCELLANEOUS) ×8 IMPLANT
CONT SPEC 4OZ CLIKSEAL STRL BL (MISCELLANEOUS) ×4 IMPLANT
COVER SURGICAL LIGHT HANDLE (MISCELLANEOUS) IMPLANT
CRADLE DONUT ADULT HEAD (MISCELLANEOUS) ×4 IMPLANT
DRAPE CARDIOVASCULAR INCISE (DRAPES) ×2
DRAPE SLUSH/WARMER DISC (DRAPES) ×4 IMPLANT
DRAPE SRG 135X102X78XABS (DRAPES) ×2 IMPLANT
DRSG AQUACEL AG ADV 3.5X14 (GAUZE/BANDAGES/DRESSINGS) ×4 IMPLANT
ELECT BLADE 4.0 EZ CLEAN MEGAD (MISCELLANEOUS) ×4
ELECT BLADE 6.5 EXT (BLADE) ×4 IMPLANT
ELECT CAUTERY BLADE 6.4 (BLADE) ×4 IMPLANT
ELECT REM PT RETURN 9FT ADLT (ELECTROSURGICAL) ×8
ELECTRODE BLDE 4.0 EZ CLN MEGD (MISCELLANEOUS) ×2 IMPLANT
ELECTRODE REM PT RTRN 9FT ADLT (ELECTROSURGICAL) ×4 IMPLANT
FELT TEFLON 1X6 (MISCELLANEOUS) ×4 IMPLANT
GAUZE SPONGE 4X4 12PLY STRL (GAUZE/BANDAGES/DRESSINGS) ×4 IMPLANT
GLOVE BIO SURGEON STRL SZ 6 (GLOVE) IMPLANT
GLOVE BIO SURGEON STRL SZ 6.5 (GLOVE) IMPLANT
GLOVE BIO SURGEON STRL SZ7.5 (GLOVE) ×16 IMPLANT
GLOVE BIO SURGEONS STRL SZ 6.5 (GLOVE)
GOWN STRL REUS W/ TWL LRG LVL3 (GOWN DISPOSABLE) ×14 IMPLANT
GOWN STRL REUS W/TWL LRG LVL3 (GOWN DISPOSABLE) ×14
HEMOSTAT POWDER SURGIFOAM 1G (HEMOSTASIS) ×20 IMPLANT
HEMOSTAT SURGICEL 2X14 (HEMOSTASIS) ×4 IMPLANT
KIT BASIN OR (CUSTOM PROCEDURE TRAY) ×4 IMPLANT
KIT ROOM TURNOVER OR (KITS) ×4 IMPLANT
KIT SUCTION CATH 14FR (SUCTIONS) ×4 IMPLANT
LINE VENT (MISCELLANEOUS) ×4 IMPLANT
LOOP VESSEL SUPERMAXI WHITE (MISCELLANEOUS) ×4 IMPLANT
NS IRRIG 1000ML POUR BTL (IV SOLUTION) ×20 IMPLANT
PACK OPEN HEART (CUSTOM PROCEDURE TRAY) ×4 IMPLANT
PAD ARMBOARD 7.5X6 YLW CONV (MISCELLANEOUS) ×8 IMPLANT
SET CARDIOPLEGIA MPS 5001102 (MISCELLANEOUS) ×4 IMPLANT
SPOGE SURGIFLO 8M (HEMOSTASIS) ×2
SPONGE SURGIFLO 8M (HEMOSTASIS) ×2 IMPLANT
SUCKER WEIGHTED FLEX (MISCELLANEOUS) ×4 IMPLANT
SUT BONE WAX W31G (SUTURE) ×4 IMPLANT
SUT ETHIBOND 2 0 SH (SUTURE) ×12 IMPLANT
SUT ETHIBOND 2 0 SH 36X2 (SUTURE) ×12 IMPLANT
SUT ETHIBOND 2 0 V4 (SUTURE) IMPLANT
SUT ETHIBOND 2 0V4 GREEN (SUTURE) IMPLANT
SUT ETHIBOND 4 0 TF (SUTURE) ×8 IMPLANT
SUT ETHIBOND 5 0 C 1 30 (SUTURE) IMPLANT
SUT PROLENE 3 0 RB 1 (SUTURE) ×4 IMPLANT
SUT PROLENE 3 0 SH 1 (SUTURE) IMPLANT
SUT PROLENE 3 0 SH DA (SUTURE) ×4 IMPLANT
SUT PROLENE 3 0 SH1 36 (SUTURE) ×12 IMPLANT
SUT PROLENE 4 0 RB 1 (SUTURE) ×4
SUT PROLENE 4 0 SH DA (SUTURE) ×4 IMPLANT
SUT PROLENE 4-0 RB1 .5 CRCL 36 (SUTURE) ×4 IMPLANT
SUT PROLENE 5 0 C 1 36 (SUTURE) ×44 IMPLANT
SUT PROLENE 6 0 C 1 30 (SUTURE) ×8 IMPLANT
SUT SILK  1 MH (SUTURE) ×6
SUT SILK 1 MH (SUTURE) ×6 IMPLANT
SUT SILK 1 TIES 10X30 (SUTURE) ×4 IMPLANT
SUT SILK 2 0 SH CR/8 (SUTURE) ×12 IMPLANT
SUT SILK 2 0 TIES 10X30 (SUTURE) ×4 IMPLANT
SUT SILK 3 0 SH CR/8 (SUTURE) ×4 IMPLANT
SUT SILK 4 0 TIE 10X30 (SUTURE) ×4 IMPLANT
SUT STEEL 6MS V (SUTURE) ×8 IMPLANT
SUT STEEL SZ 6 DBL 3X14 BALL (SUTURE) ×4 IMPLANT
SUT TEM PAC WIRE 2 0 SH (SUTURE) ×8 IMPLANT
SUT VIC AB 1 CTX 36 (SUTURE) ×6
SUT VIC AB 1 CTX36XBRD ANBCTR (SUTURE) ×6 IMPLANT
SUT VIC AB 2-0 CT1 27 (SUTURE)
SUT VIC AB 2-0 CT1 TAPERPNT 27 (SUTURE) IMPLANT
SUT VIC AB 2-0 CTX 27 (SUTURE) ×8 IMPLANT
SUT VIC AB 3-0 X1 27 (SUTURE) ×8 IMPLANT
SUTURE E-PAK OPEN HEART (SUTURE) IMPLANT
SYSTEM SAHARA CHEST DRAIN ATS (WOUND CARE) ×4 IMPLANT
TOWEL GREEN STERILE (TOWEL DISPOSABLE) ×8 IMPLANT
TOWEL GREEN STERILE FF (TOWEL DISPOSABLE) ×4 IMPLANT
TOWEL OR 17X24 6PK STRL BLUE (TOWEL DISPOSABLE) IMPLANT
TOWEL OR 17X26 10 PK STRL BLUE (TOWEL DISPOSABLE) IMPLANT
TRAY FOLEY SILVER 16FR TEMP (SET/KITS/TRAYS/PACK) ×4 IMPLANT
UNDERPAD 30X30 (UNDERPADS AND DIAPERS) ×4 IMPLANT
VRC MALLEABLE SINGLE STG 28FR (MISCELLANEOUS) ×4
WATER STERILE IRR 1000ML POUR (IV SOLUTION) ×8 IMPLANT

## 2016-12-28 NOTE — Anesthesia Procedure Notes (Signed)
Central Venous Catheter Insertion Performed by: Roberts Gaudy, anesthesiologist Start/End10/11/2016 6:50 AM, 12/28/2016 6:55 AM Patient location: Pre-op. Preanesthetic checklist: patient identified, IV checked, site marked, risks and benefits discussed, surgical consent, monitors and equipment checked, pre-op evaluation and timeout performed Position: supine Catheter size: 8.5 Fr PA cath was placed.Sheath introducer Swan type:thermodilution Ultrasound Notes:anatomy identified and no ultrasound evidence of intravascular and/or intraneural injection Attempts: 1 Following insertion, line sutured, dressing applied and Biopatch. Post procedure assessment: blood return through all ports, free fluid flow and no air  Patient tolerated the procedure well with no immediate complications.

## 2016-12-28 NOTE — Progress Notes (Signed)
Pt taken down to OR, RN present during transport. Bedside report given.

## 2016-12-28 NOTE — Procedures (Signed)
Extubation Procedure Note  Patient Details:   Name: Melissa [name] DOB: Apr 04, 1965 MRN: [id number]   Airway Documentation:     Evaluation  O2 sats: stable throughout Complications: No apparent complications Patient did tolerate procedure well. Bilateral Breath Sounds: Clear, Diminished   Yes   Patient tolerated rapid wean. NIF -26 and VC 0.95L. Positive for cuff leak. Patient extubated to a 3 Lpm [place]. No signs of dyspnea or stridor noted. Patient instructed on the Incentive Spirometer achieving 750 mL, five times. Patient resting comfortably. RN at bedside.   Myrtie Neither 12/28/2016, 5:16 PM

## 2016-12-28 NOTE — Anesthesia Procedure Notes (Signed)
Central Venous Catheter Insertion Performed by: Roberts Gaudy, anesthesiologist Start/End10/11/2016 6:55 AM, 12/28/2016 7:00 AM Patient location: Pre-op. Preanesthetic checklist: patient identified, IV checked, site marked, risks and benefits discussed, surgical consent, monitors and equipment checked, pre-op evaluation and timeout performed Position: supine Hand hygiene performed , maximum sterile barriers used  and Seldinger technique used Catheter size: 8 Fr Central line was placed.Double lumen Procedure performed using ultrasound guided technique. Ultrasound Notes:anatomy identified, needle tip was noted to be adjacent to the nerve/plexus identified and no ultrasound evidence of intravascular and/or intraneural injection Attempts: 1 Following insertion, line sutured, dressing applied and Biopatch. Post procedure assessment: blood return through all ports  Patient tolerated the procedure well with no immediate complications.

## 2016-12-28 NOTE — Progress Notes (Signed)
12/28/2016 1640 Dr. Prescott Gum paged and made aware of intermittent runs of VT 3 to 4 beats at a time noted on occasion when stimulating patient. Pt. Asymptomatic, VSS, non-sustained, immediately returning to AAI paced at 88. Verbal order Dr. Prescott Gum to give 100 mg IV Lidocaine x1 and an additional run of KCL IV. Verbal order received ok to still extubate patient if parameters WNL. Orders enacted. Will continue to closely monitor patient.  Ketzaly Cardella, Arville Lime

## 2016-12-28 NOTE — Anesthesia Preprocedure Evaluation (Signed)
Anesthesia Evaluation  Patient identified by MRN, date of birth, ID band Patient awake    Reviewed: Allergy & Precautions, NPO status , Patient's Chart, lab work & pertinent test results  Airway Mallampati: II  TM Distance: >3 FB Neck ROM: Full    Dental  (+) Teeth Intact, Dental Advisory Given   Pulmonary former smoker,    breath sounds clear to auscultation       Cardiovascular  Rhythm:Regular Rate:Normal     Neuro/Psych    GI/Hepatic   Endo/Other    Renal/GU      Musculoskeletal   Abdominal   Peds  Hematology   Anesthesia Other Findings   Reproductive/Obstetrics                             Anesthesia Physical Anesthesia Plan  ASA: III  Anesthesia Plan: General   Post-op Pain Management:    Induction: Intravenous  PONV Risk Score and Plan: Ondansetron and Dexamethasone  Airway Management Planned: Oral ETT  Additional Equipment:   Intra-op Plan:   Post-operative Plan: Extubation in OR  Informed Consent: I have reviewed the patients History and Physical, chart, labs and discussed the procedure including the risks, benefits and alternatives for the proposed anesthesia with the patient or authorized representative who has indicated his/her understanding and acceptance.   Dental advisory given  Plan Discussed with: CRNA and Anesthesiologist  Anesthesia Plan Comments:         Anesthesia Quick Evaluation  

## 2016-12-28 NOTE — Care Management Note (Addendum)
Case Management Note  Patient Details  Name: Melissa [name] MRN: [id number] Date of Birth: [birth year]  Subjective/Objective:   From home with spouse, he will be able to assist patient at dc,he will be with her for the first week of discharge , post op excision of atrial myxoma, conts on vent.  Her PCP is Dr. Maurice Small with [facility] [place].  10/10 [place], BSN- POD 1 chest tube to suction,conts on dopamine, milrinone, and neo.    10/11 [place], BSN- wean drips, d/c chest tubes, cont to diuresis.                 Action/Plan: NCM will follow patient's progression.  Expected Discharge Date:                  Expected Discharge Plan:  Home/Self Care  In-House Referral:     Discharge planning Services  CM Consult  Post Acute Care Choice:    Choice offered to:     DME Arranged:    DME Agency:     HH Arranged:    HH Agency:     Status of Service:  In process, will continue to follow  If discussed at Long Length of Stay Meetings, dates discussed:    Additional Comments:  Zenon Mayo, RN 12/28/2016, 4:00 PM

## 2016-12-28 NOTE — Anesthesia Procedure Notes (Signed)
Procedure Name: Intubation Date/Time: 12/28/2016 8:29 AM Performed by: Ollen Bowl Pre-anesthesia Checklist: Patient identified, Emergency Drugs available, Suction available and Patient being monitored Patient Re-evaluated:Patient Re-evaluated prior to induction Oxygen Delivery Method: Circle system utilized Preoxygenation: Pre-oxygenation with 100% oxygen Induction Type: IV induction Ventilation: Mask ventilation without difficulty Laryngoscope Size: Miller and 3 Grade View: Grade I Tube type: Oral Tube size: 8.0 mm Number of attempts: 1 Airway Equipment and Method: Patient positioned with wedge pillow and Stylet Placement Confirmation: ETT inserted through vocal cords under direct vision,  positive ETCO2 and breath sounds checked- equal and bilateral Secured at: 21 cm Tube secured with: Tape Dental Injury: Teeth and Oropharynx as per pre-operative assessment

## 2016-12-28 NOTE — Anesthesia Postprocedure Evaluation (Signed)
Anesthesia Post Note  Patient: Melissa [name]  Procedure(s) Performed: EXCISION OF LEFT ATRIAL MYXOMA (Left Chest) INTRAOPERATIVE TRANSESOPHAGEAL ECHOCARDIOGRAM (N/A Chest)     Patient location during evaluation: SICU Anesthesia Type: General Level of consciousness: sedated and patient remains intubated per anesthesia plan Pain management: pain level controlled Vital Signs Assessment: post-procedure vital signs reviewed and stable Respiratory status: patient on ventilator - see flowsheet for VS and patient remains intubated per anesthesia plan Cardiovascular status: blood pressure returned to baseline and stable Anesthetic complications: no    Last Vitals:  Vitals:   12/28/16 1800 12/28/16 1815  BP: (!) 107/53 (!) 98/52  Pulse: 88 88  Resp: 19 16  Temp: 37.7 C 37.7 C  SpO2: 100% 100%    Last Pain:  Vitals:   12/28/16 1700  TempSrc:   PainSc: 8                  Ragan Reale COKER

## 2016-12-28 NOTE — Progress Notes (Signed)
Pre Procedure note for inpatients:   Melissa [name] has been scheduled for Procedure(s): EXCISION OF ATRIAL MYXOMA (Left) INTRAOPERATIVE TRANSESOPHAGEAL ECHOCARDIOGRAM (N/A) today. The various methods of treatment have been discussed with the patient. After consideration of the risks, benefits and treatment options the patient has consented to the planned procedure.   The patient has been seen and labs reviewed. There are no changes in the patient's condition to prevent proceeding with the planned procedure today.  Recent labs:  Lab Results  Component Value Date   WBC 6.8 12/28/2016   HGB 10.7 (L) 12/28/2016   HCT 34.8 (L) 12/28/2016   PLT 315 12/28/2016   GLUCOSE 85 12/28/2016   CHOL 145 06/21/2012   TRIG 36.0 06/21/2012   HDL 64.60 06/21/2012   LDLDIRECT 111.7 07/08/2008   LDLCALC 73 06/21/2012   ALT 59 (H) 12/25/2016   AST 45 (H) 12/25/2016   NA 139 12/28/2016   K 3.4 (L) 12/28/2016   CL 105 12/28/2016   CREATININE 0.57 12/28/2016   BUN 9 12/28/2016   CO2 24 12/28/2016   TSH 1.362 12/25/2016   INR 1.07 12/25/2016   HGBA1C 5.2 12/27/2016    Ivin Poot III, MD 12/28/2016 7:24 AM

## 2016-12-28 NOTE — Brief Op Note (Addendum)
12/24/2016 - 12/28/2016  12:01 PM  PATIENT:  Melissa [name]  51 y.o. female  PRE-OPERATIVE DIAGNOSIS:  (L) ATRIAL MYXOMA  POST-OPERATIVE DIAGNOSIS:  (L) ATRIAL MYXOMA  PROCEDURE:  Procedure(s): EXCISION OF LEFT ATRIAL MYXOMA (Left) INTRAOPERATIVE TRANSESOPHAGEAL ECHOCARDIOGRAM (N/A)  SURGEON:  Surgeon(s) and Role:    Ivin Poot, MD - Primary  PHYSICIAN ASSISTANT:  Nicholes Rough, PA-C Otho Ket, PA-S  ANESTHESIA:   general  EBL: 400 cc  Total I/O In: -  Out: 66 [Urine:1625]  BLOOD ADMINISTERED:none  DRAINS: routine   LOCAL MEDICATIONS USED:  NONE  SPECIMEN:  Source of Specimen:  Myxoma  DISPOSITION OF SPECIMEN:  PATHOLOGY  COUNTS:  YES  TOURNIQUET:  * No tourniquets in log *  DICTATION: .Dragon Dictation  PLAN OF CARE: Admit to inpatient   PATIENT DISPOSITION:  ICU - intubated and hemodynamically stable.   Delay start of Pharmacological VTE agent (>24hrs) due to surgical blood loss or risk of bleeding: yes

## 2016-12-28 NOTE — Progress Notes (Signed)
Patient ID: Taffany Heiser, female   DOB: 1966/01/26, 51 y.o.   MRN: [id number]   SICU Evening Rounds:   Hemodynamically stable  CI = 3.7 on dopamine 3, milrinone 0.25  Extubated and alert   Urine output good  CT output low  CBC    Component Value Date/Time   WBC 16.0 (H) 12/28/2016 1337   RBC 3.95 12/28/2016 1337   HGB 9.9 (L) 12/28/2016 1350   HCT 29.0 (L) 12/28/2016 1350   PLT 205 12/28/2016 1337   MCV 80.3 12/28/2016 1337   MCH 25.1 (L) 12/28/2016 1337   MCHC 31.2 12/28/2016 1337   RDW 16.0 (H) 12/28/2016 1337   LYMPHSABS 2.2 12/25/2016 0254   MONOABS 0.5 12/25/2016 0254   EOSABS 0.2 12/25/2016 0254   BASOSABS 0.1 12/25/2016 0254     BMET    Component Value Date/Time   NA 145 12/28/2016 1350   K 3.7 12/28/2016 1350   CL 105 12/28/2016 1228   CO2 24 12/28/2016 0424   GLUCOSE 125 (H) 12/28/2016 1350   BUN 7 12/28/2016 1228   CREATININE 0.40 (L) 12/28/2016 1228   CALCIUM 8.6 (L) 12/28/2016 0424   CALCIUM 9.2 07/09/2011 1007   GFRNONAA >60 12/28/2016 0424   GFRAA >60 12/28/2016 0424     A/P:  Stable postop course. Continue current plans

## 2016-12-28 NOTE — Transfer of Care (Signed)
Immediate Anesthesia Transfer of Care Note  Patient: Melissa [name]  Procedure(s) Performed: EXCISION OF LEFT ATRIAL MYXOMA (Left Chest) INTRAOPERATIVE TRANSESOPHAGEAL ECHOCARDIOGRAM (N/A Chest)  Patient Location: SICU  Anesthesia Type:General  Level of Consciousness: Patient remains intubated per anesthesia plan  Airway & Oxygen Therapy: Patient remains intubated per anesthesia plan and Patient placed on Ventilator (see vital sign flow sheet for setting)  Post-op Assessment: Report given to RN and Post -op Vital signs reviewed and stable  Post vital signs: Reviewed and stable  Last Vitals:  Vitals:   12/28/16 0502 12/28/16 1334  BP: 110/71 (!) 107/58  Pulse: 77 80  Resp: 15 18  Temp: 36.7 C   SpO2: 100% 97%    Last Pain:  Vitals:   12/28/16 0502  TempSrc: Oral  PainSc:          Complications: No apparent anesthesia complications

## 2016-12-29 ENCOUNTER — Inpatient Hospital Stay (HOSPITAL_COMMUNITY): Payer: 59

## 2016-12-29 ENCOUNTER — Encounter (HOSPITAL_COMMUNITY): Payer: Self-pay | Admitting: Cardiothoracic Surgery

## 2016-12-29 LAB — CBC
HCT: 31.1 % — ABNORMAL LOW (ref 36.0–46.0)
HEMATOCRIT: 30.4 % — AB (ref 36.0–46.0)
HEMOGLOBIN: 9.4 g/dL — AB (ref 12.0–15.0)
HEMOGLOBIN: 9.6 g/dL — AB (ref 12.0–15.0)
MCH: 24.9 pg — AB (ref 26.0–34.0)
MCH: 24.7 pg — AB (ref 26.0–34.0)
MCHC: 30.9 g/dL (ref 30.0–36.0)
MCHC: 30.9 g/dL (ref 30.0–36.0)
MCV: 80.4 fL (ref 78.0–100.0)
MCV: 79.9 fL (ref 78.0–100.0)
Platelets: 224 10*3/uL (ref 150–400)
Platelets: 186 10*3/uL (ref 150–400)
RBC: 3.78 MIL/uL — AB (ref 3.87–5.11)
RBC: 3.89 MIL/uL (ref 3.87–5.11)
RDW: 16.1 % — ABNORMAL HIGH (ref 11.5–15.5)
RDW: 16.2 % — ABNORMAL HIGH (ref 11.5–15.5)
WBC: 12.1 10*3/uL — ABNORMAL HIGH (ref 4.0–10.5)
WBC: 12.9 10*3/uL — ABNORMAL HIGH (ref 4.0–10.5)

## 2016-12-29 LAB — BASIC METABOLIC PANEL
ANION GAP: 7 (ref 5–15)
BUN: 6 mg/dL (ref 6–20)
CHLORIDE: 104 mmol/L (ref 101–111)
CO2: 23 mmol/L (ref 22–32)
Calcium: 8 mg/dL — ABNORMAL LOW (ref 8.9–10.3)
Creatinine, Ser: 0.5 mg/dL (ref 0.44–1.00)
GFR calc Af Amer: >60 mL/min (ref >60)
GFR calc non Af Amer: >60 mL/min (ref >60)
GLUCOSE: 120 mg/dL — AB (ref 65–99)
POTASSIUM: 4 mmol/L (ref 3.5–5.1)
Sodium: 134 mmol/L — ABNORMAL LOW (ref 135–145)

## 2016-12-29 LAB — GLUCOSE, CAPILLARY
GLUCOSE-CAPILLARY: 131 mg/dL — AB (ref 65–99)
GLUCOSE-CAPILLARY: 106 mg/dL — AB (ref 65–99)
GLUCOSE-CAPILLARY: 106 mg/dL — AB (ref 65–99)
Glucose-Capillary: 115 mg/dL — ABNORMAL HIGH (ref 65–99)
Glucose-Capillary: 117 mg/dL — ABNORMAL HIGH (ref 65–99)

## 2016-12-29 LAB — POCT I-STAT, CHEM 8
BUN: 4 mg/dL — AB (ref 6–20)
CHLORIDE: 97 mmol/L — AB (ref 101–111)
CREATININE: 0.4 mg/dL — AB (ref 0.44–1.00)
Calcium, Ion: 1.19 mmol/L (ref 1.15–1.40)
Glucose, Bld: 120 mg/dL — ABNORMAL HIGH (ref 65–99)
HEMATOCRIT: 30 % — AB (ref 36.0–46.0)
Hemoglobin: 10.2 g/dL — ABNORMAL LOW (ref 12.0–15.0)
Potassium: 4 mmol/L (ref 3.5–5.1)
SODIUM: 134 mmol/L — AB (ref 135–145)
TCO2: 23 mmol/L (ref 22–32)

## 2016-12-29 LAB — CREATININE, SERUM
CREATININE: 0.52 mg/dL (ref 0.44–1.00)
GFR calc Af Amer: >60 mL/min (ref >60)
GFR calc non Af Amer: >60 mL/min (ref >60)

## 2016-12-29 LAB — MAGNESIUM
MAGNESIUM: 2.2 mg/dL (ref 1.7–2.4)
MAGNESIUM: 2 mg/dL (ref 1.7–2.4)

## 2016-12-29 MED ORDER — CHLORHEXIDINE GLUCONATE CLOTH 2 % EX PADS
6.0000 | MEDICATED_PAD | Freq: Every day | CUTANEOUS | Status: DC
  Administered 2016-12-29 – 2016-12-30 (×2): 6 via TOPICAL

## 2016-12-29 MED ORDER — FENTANYL CITRATE (PF) 100 MCG/2ML IJ SOLN
INTRAMUSCULAR | Status: AC
  Filled 2016-12-29: qty 2

## 2016-12-29 MED ORDER — FENTANYL CITRATE (PF) 100 MCG/2ML IJ SOLN
25.0000 ug | INTRAMUSCULAR | Status: DC | PRN
  Administered 2016-12-29: 50 ug via INTRAVENOUS

## 2016-12-29 MED ORDER — INSULIN ASPART 100 UNIT/ML ~~LOC~~ SOLN
0.0000 [IU] | SUBCUTANEOUS | Status: DC
Start: 2016-12-29 — End: 2016-12-29

## 2016-12-29 MED ORDER — LEVALBUTEROL HCL 1.25 MG/0.5ML IN NEBU: 1.2500 mg | INHALATION_SOLUTION | Freq: Four times a day (QID) | RESPIRATORY_TRACT | Status: DC | PRN

## 2016-12-29 MED ORDER — LEVALBUTEROL HCL 1.25 MG/0.5ML IN NEBU
1.2500 mg | INHALATION_SOLUTION | Freq: Four times a day (QID) | RESPIRATORY_TRACT | Status: DC
  Administered 2016-12-29 – 2016-12-31 (×6): 1.25 mg via RESPIRATORY_TRACT
  Filled 2016-12-29 (×7): qty 0.5

## 2016-12-29 MED ORDER — BUDESONIDE 0.5 MG/2ML IN SUSP
0.5000 mg | Freq: Two times a day (BID) | RESPIRATORY_TRACT | Status: DC
  Administered 2016-12-29 – 2017-01-01 (×6): 0.5 mg via RESPIRATORY_TRACT
  Filled 2016-12-29 (×9): qty 2

## 2016-12-29 MED ORDER — SODIUM CHLORIDE 0.9% FLUSH
10.0000 mL | Freq: Two times a day (BID) | INTRAVENOUS | Status: DC
  Administered 2016-12-29 – 2016-12-30 (×3): 10 mL

## 2016-12-29 MED ORDER — FUROSEMIDE 10 MG/ML IJ SOLN
20.0000 mg | Freq: Two times a day (BID) | INTRAMUSCULAR | Status: DC
Start: 2016-12-29 — End: 2016-12-30
  Administered 2016-12-29: 20 mg via INTRAVENOUS
  Filled 2016-12-29: qty 2

## 2016-12-29 MED ORDER — KETOROLAC TROMETHAMINE 15 MG/ML IJ SOLN
15.0000 mg | Freq: Four times a day (QID) | INTRAMUSCULAR | Status: DC
  Administered 2016-12-29 – 2016-12-30 (×4): 15 mg via INTRAVENOUS
  Filled 2016-12-29 (×6): qty 1

## 2016-12-29 MED ORDER — FENTANYL CITRATE (PF) 100 MCG/2ML IJ SOLN
25.0000 ug | INTRAMUSCULAR | Status: DC | PRN
  Administered 2016-12-29 – 2016-12-30 (×5): 25 ug via INTRAVENOUS
  Filled 2016-12-29 (×5): qty 2

## 2016-12-29 MED ORDER — SODIUM CHLORIDE 0.9% FLUSH: 10.0000 mL | INTRAVENOUS | Status: DC | PRN

## 2016-12-29 MED FILL — Heparin Sodium (Porcine) Inj 1000 Unit/ML: INTRAMUSCULAR | Qty: 20 | Status: AC

## 2016-12-29 MED FILL — Electrolyte-R (PH 7.4) Solution: INTRAVENOUS | Qty: 3000 | Status: AC

## 2016-12-29 MED FILL — Mannitol IV Soln 20%: INTRAVENOUS | Qty: 500 | Status: AC

## 2016-12-29 MED FILL — Sodium Bicarbonate IV Soln 8.4%: INTRAVENOUS | Qty: 50 | Status: AC

## 2016-12-29 MED FILL — Sodium Chloride IV Soln 0.9%: INTRAVENOUS | Qty: 2000 | Status: AC

## 2016-12-29 MED FILL — Lidocaine HCl IV Inj 20 MG/ML: INTRAVENOUS | Qty: 5 | Status: AC

## 2016-12-29 NOTE — Progress Notes (Signed)
1 Day Post-Op Procedure(s) (LRB): EXCISION OF LEFT ATRIAL MYXOMA (Left) INTRAOPERATIVE TRANSESOPHAGEAL ECHOCARDIOGRAM (N/A) Subjective: Neuro intact after resection of a 6 cm left atrial myxoma Preoperative mitral stenosis physiology with pulmonary hypertension TR, RV dilatation Sinus rhythm postop, on low-dose milrinone and dopamine for RV dysfunction and Neo-Synephrine for blood pressure.Cardiac index 2.5  Objective: Vital signs in last 24 hours: Temp:  [95.9 F (35.5 C)-100.2 F (37.9 C)] 99.2 F (37.3 C) (10/10 1201) Pulse Rate:  [85-96] 85 (10/10 1400) Cardiac Rhythm: Normal sinus rhythm;Atrial paced (10/10 1200) Resp:  [0-21] 11 (10/10 1400) BP: (73-113)/(39-70) 93/69 (10/10 1400) SpO2:  [95 %-100 %] 98 % (10/10 1400) Arterial Line BP: (95-130)/(49-64) 112/54 (10/10 1400) FiO2 (%):  [40 %] 40 % (10/09 1602) Weight:  [230 lb 1.6 oz (104.4 kg)] 230 lb 1.6 oz (104.4 kg) (10/10 0530)  Hemodynamic parameters for last 24 hours: PAP: (38-57)/(15-30) 47/25 CO:  [5.8 L/min-7.8 L/min] 6.6 L/min CI:  [2.7 L/min/m2-3.7 L/min/m2] 3.1 L/min/m2  Intake/Output from previous day: 10/09 0701 - 10/10 0700 In: 7223 [P.O.:480; I.V.:4748; Blood:715; NG/GT:30; IV Piggyback:1250] Out: 2979 [GXQJJ:9417; Blood:1115; Chest Tube:520] Intake/Output this shift: Total I/O In: 498.1 [P.O.:120; I.V.:378.1] Out: 370 [Urine:250; Chest Tube:120]       Exam    General- alert and comfortable   Lungs- clear without rales, wheezes   Cor- regular rate and rhythm, no murmur , gallop   Abdomen- soft, non-tender   Extremities - warm, non-tender, minimal edema   Neuro- oriented, appropriate, no focal weakness   Lab Results:  Recent Labs  12/28/16 1903 12/29/16 0332  WBC 14.2* 12.1*  HGB 10.0* 9.4*  HCT 32.0* 30.4*  PLT 232 224   BMET:  Recent Labs  12/28/16 0424  12/28/16 1854 12/28/16 1903 12/29/16 0332  NA 139  < > 140  --  134*  K 3.4*  < > 4.4  --  4.0  CL 105  < > 105  --  104  CO2  24  --   --   --  23  GLUCOSE 85  < > 118*  --  120*  BUN 9  < > 6  --  6  CREATININE 0.57  < > 0.40* 0.42* 0.50  CALCIUM 8.6*  --   --   --  8.0*  < > = values in this interval not displayed.  PT/INR:  Recent Labs  12/28/16 1337  LABPROT 16.7*  INR 1.37   ABG    Component Value Date/Time   PHART 7.371 12/28/2016 1801   HCO3 22.8 12/28/2016 1801   TCO2 24 12/28/2016 1854   ACIDBASEDEF 2.0 12/28/2016 1801   O2SAT 98.0 12/28/2016 1801   CBG (last 3)   Recent Labs  12/28/16 2355 12/29/16 0337 12/29/16 0814  GLUCAP 117* 131* 106*    Assessment/Plan: S/P Procedure(s) (LRB): EXCISION OF LEFT ATRIAL MYXOMA (Left) INTRAOPERATIVE TRANSESOPHAGEAL ECHOCARDIOGRAM (N/A) Mobilize Diuresis See progression orders Leave chest tubes for serous drainage   wean  Phenylephrine has blood pressure allows. Continue low-dose milrinone for RV dysfunction Hold Lasix until blood pressure improved  LOS: 5 days    Melissa [name] 12/29/2016

## 2016-12-29 NOTE — Progress Notes (Signed)
Patient ID: Melissa [name], female   DOB: 1965/10/08, 51 y.o.   MRN: [id number] EVENING ROUNDS NOTE :     [address]       [place], [address]             [phone]                 1 Day Post-Op Procedure(s) (LRB): EXCISION OF LEFT ATRIAL MYXOMA (Left) INTRAOPERATIVE TRANSESOPHAGEAL ECHOCARDIOGRAM (N/A)  Total Length of Stay:  LOS: 5 days  BP 116/68   Pulse 80   Temp 98.8 F (37.1 C) (Oral)   Resp 15   Ht 5\' 7"  (1.702 m)   Wt 230 lb 1.6 oz (104.4 kg)   SpO2 98%   BMI 36.04 kg/m   .Intake/Output      10/09 0701 - 10/10 0700 10/10 0701 - 10/11 0700   P.O. 480 120   I.V. (mL/kg) 4748 (45.5) 584.6 (5.6)   Blood 715    NG/GT 30    IV Piggyback 1250 50   Total Intake(mL/kg) 7223 (69.2) 754.6 (7.2)   Urine (mL/kg/hr) 4685 (1.9) 525 (0.4)   Emesis/NG output 0    Blood 1115    Chest Tube 520 240   Total Output 6320 765   Net +903 -10.4          . sodium chloride Stopped (12/29/16 1100)  . sodium chloride    . sodium chloride Stopped (12/28/16 2200)  . cefUROXime (ZINACEF)  IV Stopped (12/29/16 1731)  . DOPamine 3 mcg/kg/min (12/28/16 1900)  . lactated ringers 20 mL/hr at 12/28/16 1900  . lactated ringers 20 mL/hr at 12/28/16 1900  . milrinone 0.125 mcg/kg/min (12/29/16 0647)  . phenylephrine (NEO-SYNEPHRINE) Adult infusion Stopped (12/29/16 1500)     Lab Results  Component Value Date   WBC 12.9 (H) 12/29/2016   HGB 9.6 (L) 12/29/2016   HCT 31.1 (L) 12/29/2016   PLT 186 12/29/2016   GLUCOSE 120 (H) 12/29/2016   CHOL 145 06/21/2012   TRIG 36.0 06/21/2012   HDL 64.60 06/21/2012   LDLDIRECT 111.7 07/08/2008   LDLCALC 73 06/21/2012   ALT 59 (H) 12/25/2016   AST 45 (H) 12/25/2016   NA 134 (L) 12/29/2016   K 4.0 12/29/2016   CL 97 (L) 12/29/2016   CREATININE 0.52 12/29/2016   BUN 4 (L) 12/29/2016   CO2 23 12/29/2016   TSH 1.362 12/25/2016   INR 1.37 12/28/2016   HGBA1C 5.2 12/27/2016   Trouble controlling pain, ct still  Stable day      Grace Isaac MD  Beeper [phone] Office [phone] 12/29/2016 6:45 PM

## 2016-12-29 NOTE — Progress Notes (Signed)
Anesthesiology Follow-up:  Awake and alert, neuro intact, sitting in chair, complaining of incisional soreness. Hemodynamically stable.  Milrinone 0.125 mcg/kg/hr Dopamine 3 mcg/kg/min Neosynephrine 15 mcg/min  VS: T- 37.3 BP-115/58 HR- 90 (a-paced) RR- 13 O2 Sat 99% on 4L O2 PA -47/25 CO/CI- 6.6/3.1  K-4.0 BUN/Cr- 6/0.5 glucose- 103 H/H- 9.4/30.4 Platelets- 224,000  Extubated 3 hours post-op  51 year old female one day S/P excision of large atrial myoxma, doing well so far with no apparent complications.

## 2016-12-29 NOTE — Op Note (Signed)
NAME:  TORRANCE, FRECH ANN                  ACCOUNT NO.:  MEDICAL RECORD NO.:  [id number]  LOCATION:                                 FACILITY:  PHYSICIAN:  Ivin Poot, M.D.       DATE OF BIRTH:  DATE OF PROCEDURE:  12/28/2016 DATE OF DISCHARGE:                              OPERATIVE REPORT   OPERATIONS: 1. Resection of 6 cm left atrial myxoma. 2. Pericardial patch of atrial septal excision.  PREOPERATIVE DIAGNOSIS:  6 cm left atrial myxoma with symptoms of mitral stenosis, pulmonary hypertension, and right ventricular dysfunction.  POSTOPERATIVE DIAGNOSIS:  6 cm left atrial myxoma with symptoms of mitral stenosis, pulmonary hypertension, and right ventricular dysfunction.  SURGEON:  Ivin Poot, M.D.  ASSISTANT:  Nicholes Rough, PA-C.  ANESTHESIA:  General by Glynda Jaeger, M.D.  INDICATIONS:  The patient is a 51 year old, obese female, who presented to the hospital with progressive symptoms of shortness of breath, decreased exercise tolerance, orthopnea, and fatigue.  An echocardiogram was performed by her cardiologist, which was abnormal, showing a large left atrial mass obstructing the mitral valve with preserved LV systolic function, but a dilated right ventricle and right atrium with moderate functional tricuspid regurgitation.  She was admitted from the office. She was placed on diuretics and carvedilol to reduce her heart rate to improve diastolic filling.  I saw the patient in consultation after reviewing her echocardiogram images and agreed with the recommendation for surgical resection.  Yesterday, the patient underwent left and right heart catheterization, showing normal coronaries.  She had severe pulmonary hypertension and elevated CVP from the obstructing left atrial mass.  I discussed the procedure of the median sternotomy and resection of left atrial myxoma with patching the interatrial septum, which would be excised at the origin point of the tumor.   I discussed the use of general anesthesia, cardiopulmonary bypass, and the location of the surgical incision.  I discussed the expected postoperative recovery in the ICU and progressive care unit.  I reviewed the risks to her of the operation including the risks of stroke, MI, bleeding, ASD, mitral regurgitation, wound infection, postoperative pulmonary problems including pleural effusion, postoperative heart block requiring permanent pacemaker, and death.  After reviewing these issues, she demonstrated her understanding and agreed to proceed with surgery under what I felt was an informed consent.  OPERATIVE FINDINGS: 1. Large left atrial mass originating just above the A3 segment of the     mitral valve in the interatrial septum. 2. Successful excision of the mass en bloc with a portion of the     interatrial septum at the site of origin. 3. Pericardial patch of the interatrial septum without postop atrial     septal defect. 4. No blood products required for the surgery.  DESCRIPTION OF PROCEDURE:  The patient was brought to the operating room, placed supine on the operating table.  General anesthesia was induced under invasive hemodynamic monitoring.  A transesophageal echo probe was placed by the patient's anesthesiologist.  The patient had pulmonary hypertension by PA catheter measurements.  The chest, abdomen, and legs were prepped with Betadine and draped as a sterile field.  A proper time-out was performed.  A sternal incision was made.  The sternum was retracted and the pericardium was opened.  There was a moderate clear pericardial effusion.  A section of the pericardium was excised and then processed in glutaraldehyde and saline rinses for use of an intracardiac patch.  Pursestrings were placed in ascending aorta and the superior vena cava. Heparin was administered and ACT was documented as being therapeutic. The patient was cannulated in the ascending aorta and superior  vena cava and placed on cardiopulmonary bypass.  A second pursestring was placed over the inferior vena cava and the patient was bicaval cannulated with vessel loops around each cannula.  The interatrial groove was dissected, cardioplegia cannulas were placed both antegrade and retrograde cold blood cardioplegia.  The patient was cooled to 32 degrees and an aortic crossclamp was applied.  One liter of cold blood cardioplegia was delivered in split doses between the antegrade aortic and retrograde coronary sinus catheters.  There was good cardioplegic arrest and septal temperature dropped less than 14 degrees.  Cardioplegia was delivered every 20 minutes.  A left atriotomy was performed.  The atrial mass was immediately noted. It completely obstructed the view of the mitral valve.  It was gently dissected from the left atrial endothelium using the [facility] down to the point where the mass was attached to the interatrial septum. With great care, none of the tumor fragmented or broke off.  Using an 11 blade scalpel, a core of the interatrial septum was excised where the origin or stalk of the tumor originated.  The tumor was then removed and submitted to Pathology.  The mitral valve was inspected and tested using cold saline valve test. There did not appear to be a significant jet of mitral regurgitation.  A piece of the pericardial patch was then trimmed to the appropriate size and configuration and then used to close the defect in the interatrial septum with running 5-0 Prolene.  After this was performed, the mitral valve was again inspected and tested with cold saline.  There was a jet at the commissure between A3 and P3 and this was repaired with a single figure-of-eight commissuroplasty 4-0 Ethibond suture.  Upon testing the valve again, there was no significant leak.  The left atrium was irrigated with copious amounts of cold saline.  It should be noted that the  total surgical field was insufflated with CO2 during the procedure.  The left atriotomy was then closed in 2 layers after removing the retractors using 4-0 Prolene.  Before tying down the suture line, air was vented from the left side of the heart with the usual de-airing maneuvers head-down, volume left to the heart, and gently squeezing the heart.  The suture line was then tied.  A second running suture line was used to reinforce the first suture line of atrial closure.  The patient was rewarmed and reperfused.  A dose of retrograde warm blood cardioplegia was then given and the crossclamp was removed.  The vent in the ascending aorta remained on suction and further air was removed from the left side of the heart using the usual de-airing maneuvers.  The heart resumed a spontaneous rhythm.  Temporary pacing wires were applied.  Air was further removed from the left side of heart.  The lungs were expanded and the ventilator was resumed.  The patient was started on low-dose milrinone and dopamine.  The patient was then transitioned off cardiopulmonary bypass without difficulty.  There was good  biventricular function, although initially RV function was somewhat depressed, but then recovered with time.  PA pressures were back towards normal at 40/20.  Cardiac output was 5 L/minute.  The echo showed no significant mitral regurgitation, the tricuspid regurgitation had resolved.  There was no evidence of an ASD and the echo showed no residual myxoma.  The mediastinum was irrigated.  The superior pericardial fat was closed over the aorta.  Anterior mediastinal and right pleural tubes were placed and brought out through separate incisions.  The caval and aortic cannulas had been removed after protamine was administered without adverse effect.  The sternum was closed with interrupted steel wire.  The pectoralis fascia and subcutaneous layers were closed with a running #1 Vicryl and skin was  closed with a subcuticular.  Total cardiopulmonary bypass time was 130 minutes.     Ivin Poot, M.D.     PV/MEDQ  D:  12/28/2016  T:  12/29/2016  Job:  [id number]  cc:   Jenkins Rouge, MD, [facility]

## 2016-12-30 ENCOUNTER — Inpatient Hospital Stay (HOSPITAL_COMMUNITY): Payer: 59

## 2016-12-30 LAB — BASIC METABOLIC PANEL
Anion gap: 4 — ABNORMAL LOW (ref 5–15)
BUN: 6 mg/dL (ref 6–20)
CO2: 26 mmol/L (ref 22–32)
Calcium: 8 mg/dL — ABNORMAL LOW (ref 8.9–10.3)
Chloride: 102 mmol/L (ref 101–111)
Creatinine, Ser: 0.53 mg/dL (ref 0.44–1.00)
GFR calc Af Amer: >60 mL/min (ref >60)
GFR calc non Af Amer: >60 mL/min (ref >60)
Glucose, Bld: 104 mg/dL — ABNORMAL HIGH (ref 65–99)
Potassium: 3.8 mmol/L (ref 3.5–5.1)
Sodium: 132 mmol/L — ABNORMAL LOW (ref 135–145)

## 2016-12-30 LAB — CBC
HCT: 26.2 % — ABNORMAL LOW (ref 36.0–46.0)
Hemoglobin: 8.2 g/dL — ABNORMAL LOW (ref 12.0–15.0)
MCH: 24.9 pg — ABNORMAL LOW (ref 26.0–34.0)
MCHC: 31.3 g/dL (ref 30.0–36.0)
MCV: 79.6 fL (ref 78.0–100.0)
Platelets: 160 10*3/uL (ref 150–400)
RBC: 3.29 MIL/uL — ABNORMAL LOW (ref 3.87–5.11)
RDW: 16 % — ABNORMAL HIGH (ref 11.5–15.5)
WBC: 10.9 10*3/uL — ABNORMAL HIGH (ref 4.0–10.5)

## 2016-12-30 MED ORDER — MIDAZOLAM HCL 2 MG/2ML IJ SOLN: 2.0000 mg | Freq: Four times a day (QID) | INTRAMUSCULAR | Status: DC | PRN

## 2016-12-30 MED ORDER — FUROSEMIDE 10 MG/ML IJ SOLN
40.0000 mg | Freq: Two times a day (BID) | INTRAMUSCULAR | Status: DC
Start: 2016-12-30 — End: 2016-12-30
  Administered 2016-12-30: 40 mg via INTRAVENOUS
  Filled 2016-12-30: qty 4

## 2016-12-30 MED ORDER — POTASSIUM CHLORIDE 10 MEQ/50ML IV SOLN
10.0000 meq | INTRAVENOUS | Status: AC
  Administered 2016-12-30 (×2): 10 meq via INTRAVENOUS
  Filled 2016-12-30 (×2): qty 50

## 2016-12-30 MED ORDER — FUROSEMIDE 10 MG/ML IJ SOLN
40.0000 mg | Freq: Every day | INTRAMUSCULAR | Status: AC
  Administered 2016-12-31: 40 mg via INTRAVENOUS
  Filled 2016-12-30: qty 4

## 2016-12-30 MED FILL — Dexmedetomidine HCl in NaCl 0.9% IV Soln 400 MCG/100ML: INTRAVENOUS | Qty: 100 | Status: AC

## 2016-12-30 MED FILL — Heparin Sodium (Porcine) Inj 1000 Unit/ML: INTRAMUSCULAR | Qty: 30 | Status: AC

## 2016-12-30 MED FILL — Potassium Chloride Inj 2 mEq/ML: INTRAVENOUS | Qty: 40 | Status: AC

## 2016-12-30 MED FILL — Magnesium Sulfate Inj 50%: INTRAMUSCULAR | Qty: 10 | Status: AC

## 2016-12-30 NOTE — Progress Notes (Signed)
Wasted Toradol in sharps container with Verlin Grills, RN.

## 2016-12-30 NOTE — Progress Notes (Signed)
[facility] BRIEF SICU PROGRESS NOTE  2 Days Post-Op  S/P Procedure(s) (LRB): EXCISION OF LEFT ATRIAL MYXOMA (Left) INTRAOPERATIVE TRANSESOPHAGEAL ECHOCARDIOGRAM (N/A)   Stable day NSR w/ stable BP  Breathing comfortably on room air Labs okay  Plan: Continue current plan  Rexene Alberts, MD 12/30/2016 5:36 PM

## 2016-12-30 NOTE — Progress Notes (Signed)
2 Days Post-Op Procedure(s) (LRB): EXCISION OF LEFT ATRIAL MYXOMA (Left) INTRAOPERATIVE TRANSESOPHAGEAL ECHOCARDIOGRAM (N/A) Subjective: Feels better with xopenex nebs- on room air nsr cxr clear  Weaning drips Objective: Vital signs in last 24 hours: Temp:  [97.7 F (36.5 C)-99.3 F (37.4 C)] 97.7 F (36.5 C) (10/10 2300) Pulse Rate:  [63-89] 76 (10/11 0700) Cardiac Rhythm: Normal sinus rhythm (10/11 0000) Resp:  [8-22] 15 (10/11 0700) BP: (82-131)/(39-71) 93/54 (10/11 0700) SpO2:  [96 %-100 %] 98 % (10/11 0737) Arterial Line BP: (111-124)/(51-58) 118/56 (10/10 1600) Weight:  [230 lb 13.2 oz (104.7 kg)] 230 lb 13.2 oz (104.7 kg) (10/11 0600)  Hemodynamic parameters for last 24 hours: PAP: (47-48)/(24-25) 47/25 CO:  [6.6 L/min] 6.6 L/min CI:  [3.1 L/min/m2] 3.1 L/min/m2  Intake/Output from previous day: 10/10 0701 - 10/11 0700 In: 1788.3 [P.O.:360; I.V.:1328.3; IV Piggyback:100] Out: 2185 [Urine:1815; Chest Tube:370] Intake/Output this shift: No intake/output data recorded.       Exam    General- alert and comfortable   Lungs- clear without rales, wheezes   Cor- regular rate and rhythm, no murmur , gallop   Abdomen- soft, non-tender   Extremities - warm, non-tender, minimal edema   Neuro- oriented, appropriate, no focal weakness   Lab Results:  Recent Labs  12/29/16 1719 12/30/16 0301  WBC 12.9* 10.9*  HGB 9.6* 8.2*  HCT 31.1* 26.2*  PLT 186 160   BMET:  Recent Labs  12/29/16 0332 12/29/16 1703 12/29/16 1719 12/30/16 0301  NA 134* 134*  --  132*  K 4.0 4.0  --  3.8  CL 104 97*  --  102  CO2 23  --   --  26  GLUCOSE 120* 120*  --  104*  BUN 6 4*  --  6  CREATININE 0.50 0.40* 0.52 0.53  CALCIUM 8.0*  --   --  8.0*    PT/INR:  Recent Labs  12/28/16 1337  LABPROT 16.7*  INR 1.37   ABG    Component Value Date/Time   PHART 7.371 12/28/2016 1801   HCO3 22.8 12/28/2016 1801   TCO2 23 12/29/2016 1703   ACIDBASEDEF 2.0 12/28/2016 1801   O2SAT 98.0 12/28/2016 1801   CBG (last 3)   Recent Labs  12/29/16 0814 12/29/16 1155 12/29/16 1553  GLUCAP 106* 106* 115*    Assessment/Plan: S/P Procedure(s) (LRB): EXCISION OF LEFT ATRIAL MYXOMA (Left) INTRAOPERATIVE TRANSESOPHAGEAL ECHOCARDIOGRAM (N/A) Wean drips DC chest tubes diuresis   LOS: 6 days    Tharon Aquas Trigt III 12/30/2016

## 2016-12-31 ENCOUNTER — Inpatient Hospital Stay (HOSPITAL_COMMUNITY): Payer: 59

## 2016-12-31 LAB — TYPE AND SCREEN
ABO/RH(D): O POS
Antibody Screen: NEGATIVE
Unit division: 0
Unit division: 0
Unit division: 0
Unit division: 0

## 2016-12-31 LAB — BPAM RBC
Blood Product Expiration Date: 201811052359
Blood Product Expiration Date: 201811052359
Blood Product Expiration Date: 201811052359
Blood Product Expiration Date: 201811052359
ISSUE DATE / TIME: 201810070356
ISSUE DATE / TIME: 201810081639
ISSUE DATE / TIME: 201810101347
ISSUE DATE / TIME: 201810101407
Unit Type and Rh: 5100
Unit Type and Rh: 5100
Unit Type and Rh: 5100
Unit Type and Rh: 5100

## 2016-12-31 MED ORDER — SODIUM CHLORIDE 0.9% FLUSH
3.0000 mL | Freq: Two times a day (BID) | INTRAVENOUS | Status: DC
  Administered 2016-12-31 – 2017-01-01 (×3): 3 mL via INTRAVENOUS

## 2016-12-31 MED ORDER — FUROSEMIDE 40 MG PO TABS
40.0000 mg | ORAL_TABLET | Freq: Every day | ORAL | Status: DC
  Administered 2017-01-01: 40 mg via ORAL
  Filled 2016-12-31: qty 1

## 2016-12-31 MED ORDER — FE FUMARATE-B12-VIT C-FA-IFC PO CAPS
1.0000 | ORAL_CAPSULE | Freq: Two times a day (BID) | ORAL | Status: DC
  Administered 2016-12-31 – 2017-01-01 (×3): 1 via ORAL
  Filled 2016-12-31 (×3): qty 1

## 2016-12-31 MED ORDER — LEVALBUTEROL HCL 1.25 MG/0.5ML IN NEBU
1.2500 mg | INHALATION_SOLUTION | Freq: Two times a day (BID) | RESPIRATORY_TRACT | Status: DC
  Administered 2016-12-31 – 2017-01-01 (×3): 1.25 mg via RESPIRATORY_TRACT
  Filled 2016-12-31 (×4): qty 0.5

## 2016-12-31 MED ORDER — SODIUM CHLORIDE 0.9 % IV SOLN: 250.0000 mL | INTRAVENOUS | Status: DC | PRN

## 2016-12-31 MED ORDER — LEVALBUTEROL HCL 0.63 MG/3ML IN NEBU: 0.6300 mg | INHALATION_SOLUTION | Freq: Four times a day (QID) | RESPIRATORY_TRACT | Status: DC | PRN

## 2016-12-31 MED ORDER — MAGNESIUM HYDROXIDE 400 MG/5ML PO SUSP: 30.0000 mL | Freq: Every day | ORAL | Status: DC | PRN

## 2016-12-31 MED ORDER — MOVING RIGHT ALONG BOOK
Freq: Once | Status: AC
  Administered 2016-12-31: 09:00:00
  Filled 2016-12-31: qty 1

## 2016-12-31 MED ORDER — SODIUM CHLORIDE 0.9% FLUSH: 3.0000 mL | INTRAVENOUS | Status: DC | PRN

## 2016-12-31 NOTE — Discharge Instructions (Signed)
Activity: 1.May walk up steps                2.No lifting more than ten pounds for four weeks.                 3.No driving for four weeks.                4.Stop any activity that causes chest pain, shortness of breath, dizziness, sweating or excessive weakness.                5.Avoid straining.                6.Continue with your breathing exercises daily.  Diet: Low fat, Low salt diet  Wound Care: May shower.  Clean wounds with mild soap and water daily. Contact the office at [phone] if any problems arise.

## 2016-12-31 NOTE — Discharge Summary (Signed)
Physician Discharge Summary       [address]       [place] [address]             [phone]    Patient ID: Melissa [name] MRN: [id number] DOB/AGE: [birth year] 51 y.o.  Admit date: 12/24/2016 Discharge date: 01/02/2017  Admission Diagnoses: Left atrial myxoma  Active Diagnoses:  1. Acute diastolic heart failure ([place]) 2. ADD (attention deficit disorder) 3. Arthritis 4. Asthma 5. Depression 6. Migraine 7.RLS (restless legs syndrome) Osteoporosis 8. ABL anemia  Procedure (s):  ------------------------------------------------------------------- Transthoracic Echocardiography  (Report amended )  Patient:    Melissa [name], Melissa [name] MR #:       [id number] Study Date: 12/24/2016 Gender:     F Age:        64 Height:     170.2 cm Weight:     102.1 kg BSA:        2.24 m^2 Pt. Status: Room:   SONOGRAPHER  York Grice [id number]  Lucretia Kern [id number]  PERFORMING   [facility], Outpatient  ATTENDING    Skeet Latch, MD  cc:  ------------------------------------------------------------------- LV EF: 55% -   60%  ------------------------------------------------------------------- Indications:      (R06.09).  ------------------------------------------------------------------- History:   PMH:  Acquired from the patient and from the patient&'s chart.  Dyspnea.  Risk factors:  Former tobacco use. Morbidly obese.  ------------------------------------------------------------------- Study Conclusions  - Left ventricle: The cavity size was normal. Systolic function was   normal. The estimated ejection fraction was in the range of 55%   to 60%. Wall motion was normal; there were no regional wall   motion abnormalities. The study is not technically sufficient to   allow evaluation of LV diastolic function. Doppler parameters are   consistent with high ventricular filling pressure. - Aortic valve: Transvalvular  velocity was within the normal range.   There was no stenosis. There was no regurgitation. - Mitral valve: Transvalvular velocity was severely elevated. There   was moderate regurgitation. Mean gradient (D): 20 mm Hg. - Left atrium: The atrium was severely dilated. There was a large,   2.9 cm (L) x 4.5 cm (W) mass in the low atrial cavity that   obstructs mitral valve inflow. The mass is mostly hypoechoic   raising the possibility of a complex blood cyst. Findings are   less consistent with a myoxoma. It appears to attach to the low   right atrium, but cannot rule out attachement to the anterior   mitral valve leaflet. - Right ventricle: The cavity size was normal. Wall thickness was   normal. Systolic function was normal. - Atrial septum: No defect or patent foramen ovale was identified. - Tricuspid valve: There was moderate regurgitation. - Pulmonary arteries: Systolic pressure was severely increased. PA   peak pressure: 89 mm Hg (S).  Impressions:  - Mitral valve gradients are severely elevated due to obstruction   by the left atrial mass. The mital valve appears otherwise normal   and unrestricted.  Recommendations:  Recommend TEE or cardiac MRI to better assess the large left atrial mass and its point of attachment. Also consider left heart cathetherization and urgent surgical consultation. Will discuss findings with [name].  RIGHT HEART CATH AND CORONARY ANGIOGRAPHY by [name] on 12/27/2016:  Conclusion   Conclusions: 1. Minimal atherosclerotic plaquing; no angiographically significant coronary artery disease. 2. Hypervascular, mobile structure supplied predominantly by the right  coronary artery, consistent with left atrial mass seen on recent echo. 3. Moderately elevated left and right heart filling pressures. 4. Severe pulmonary hypertension. 5. Normal Fick cardiac output/index.  Recommendations: 1. Increase furosemide to 20 mg IV BID. 2. Proceed with workup and  removal of left atrial mass, per Dr. Prescott Gum.    1. Resection of 6 cm left atrial myxoma. 2. Pericardial patch of atrial septal excision by Dr. Prescott Gum on 12/29/2016.  History of Presenting Illness: This is a  51 year old obese reformed smoker status post gastric bypass surgery with fibromyalgia and chronic migraine headaches who was admitted after echocardiogram showing a 4 cm left atrial myxoma. The patient has had progressive symptoms of shortness of breath decreased exercise tolerance probable PND and ankle edema. She has maintained a sinus rhythm. She has no history of cardiac disease. The patient states she's had general anesthesia without complication for several procedures including knee replacement surgery bariatric surgery and hysterectomy.  The patient denies any neurologic symptoms or symptoms of embolic problems from the left atrial myxoma. Echocardiogram shows no structural disease of the mitral valve but there is some moderate functional tricuspid regurgitation from probable pulmonary hypertension. LV function is preserved. Cardiac catheterization showed no significant coronary artery disease, severe pulmonary hypertension, LA mass by the RCA, and normal Fick cardiac output/index.  Dr. Prescott Gum discussed the need for median sternotomy for resection of LA myxoma. Potential risks, benefits, and complications of the surgery were discussed with the patient and she agreed to proceed. Pre operative carotid duplex US showed no significant internal carotid artery stenosis bilaterally. She underwent resection of the LA myxoma with pericardial patch of atrial septal excision on 12/28/2016.  Brief Hospital Course:  The patient was extubated the evening of surgery without difficulty. She remained afebrile and hemodynamically stable. She was weaned off of Milrinone, Neo Synephrine, and Dopamine drips. Gordy Councilman, a line, chest tubes, and foley were removed early in the post operative course.  Lopressor was started and titrated accordingly. She was volume over loaded and diuresed. She had ABL anemia. She did not require a post op transfusion. Last H and H was 7.9/24.4 .  She was started on oral iron. She was weaned off the insulin drip.   The patient's HGA1C pre op was 5.2 . The patient was felt surgically stable for transfer from the ICU to PCTU for further convalescence on 01/01/2017. She continues to progress with cardiac rehab. She was ambulating on room air. She has been tolerating a diet and has had a bowel movement. Epicardial pacing wires were removed on 01/01/2017. Chest tube sutures will be removed the day of discharge. The patient is felt surgically stable for discharge today.   Latest Vital Signs: Blood pressure 122/72, pulse 72, temperature 97.9 F (36.6 C), temperature source Oral, resp. rate 17, height 5\' 7"  (1.702 m), weight 229 lb 4.8 oz (104 kg), SpO2 98 %.  Physical Exam: General- alert and comfortable   Lungs- clear without rales, wheezes   Cor- regular rate and rhythm, no murmur , gallop   Abdomen- soft, non-tender   Extremities - warm, non-tender, minimal edema   Neuro- oriented, appropriate, no focal weakness  Discharge Condition:Stable and discharged to home.  Recent laboratory studies:  Lab Results  Component Value Date   WBC 9.5 01/01/2017   HGB 7.9 (L) 01/01/2017   HCT 24.4 (L) 01/01/2017   MCV 79.2 01/01/2017   PLT 226 01/01/2017   Lab Results  Component Value Date  NA 136 01/01/2017   K 3.4 (L) 01/01/2017   CL 103 01/01/2017   CO2 24 01/01/2017   CREATININE 0.50 01/01/2017   GLUCOSE 97 01/01/2017    Diagnostic Studies:    Ct Chest W Contrast  Result Date: 12/25/2016 CLINICAL DATA:  Atrial myxoma, dyspnea and asthma. EXAM: CT CHEST WITH CONTRAST TECHNIQUE: Multidetector CT imaging of the chest was performed during intravenous contrast administration. CONTRAST:  75 cc Isovue-300 IV COMPARISON:  None. FINDINGS: Cardiovascular: There is a  soft tissue mass within the left atrium measuring 4 x 6.2 x 4.1 cm with its base along the interatrial septum and slightly prolapsing through mitral valve.Normal branch pattern of the great vessels. No aortic aneurysm or dissection. Preferential enhancement of the pulmonary arterial system. Dilatation of the main pulmonary artery consistent with a component of pulmonary hypertension is seen up to 3.5 cm in caliber. There has also dilatation of the right and left main pulmonary artery is, measuring 3.4 cm on the right and 2 cm on the left. Coronary arteriosclerosis is noted predominant involving the LAD. Mediastinum/Nodes: Tiny too small to further characterize hypodensities within the thyroid without thyromegaly. Lungs/Pleura: Scarring and/or atelectasis is seen in the lingula in each lung base. No pneumonic consolidation, effusion or pneumothorax. No dominant mass. Upper Abdomen: Status post bariatric surgery. No acute upper abdominal abnormality. Musculoskeletal: Midthoracic kyphosis attributable to moderate disc space narrowing and endplate spurring. No acute nor suspicious osseous abnormality. IMPRESSION: 1. Intracavitary mass predominantly noted within the left atrium with slight protrusion through the mitral valve, possibly representing a ball valve effect, overall measuring 4 x 6.2 x 4.1 cm and consistent with reported history of atrial myxoma. 2. Dilatation of the main, right and left pulmonary arteries consistent with pulmonary hypertension. 3. Coronary arteriosclerosis. 4. Lingular bibasilar scarring and/or atelectasis. Electronically Signed   By: Ashley Royalty M.D.   On: 12/25/2016 19:46   Dg Chest Port 1 View  Result Date: 12/31/2016 CLINICAL DATA:  Shortness of Breath EXAM: PORTABLE CHEST 1 VIEW COMPARISON:  12/30/2016 FINDINGS: Interval removal of right central line and right chest tube. No pneumothorax. Cardiomegaly. Bibasilar atelectasis, left greater than right, slightly improved since prior  study with increasing lung volumes. No effusions. IMPRESSION: Interval removal of right chest tube without pneumothorax. Improving aeration with improving bibasilar atelectasis. Electronically Signed   By: Rolm Baptise M.D.   On: 12/31/2016 07:16    Discharge Instructions    Discharge patient    Complete by:  As directed    Discharge disposition:  01-Home or Self Care   Discharge patient date:  01/02/2017     Discharge Medications: Allergies as of 01/02/2017      Reactions   Other Anaphylaxis, Other (See Comments)   HONEYDEW MELON      Medication List    STOP taking these medications   VENTOLIN HFA 108 (90 Base) MCG/ACT inhaler Generic drug:  albuterol     TAKE these medications   acetaminophen 500 MG tablet Commonly known as:  TYLENOL Take 2 tablets (1,000 mg total) by mouth every 8 (eight) hours as needed.   ASMANEX HFA 200 MCG/ACT Aero Generic drug:  Mometasone Furoate Inhale 200 mcg into the lungs 2 (two) times daily.   aspirin 325 MG EC tablet Take 1 tablet (325 mg total) by mouth daily.   cetirizine 10 MG tablet Commonly known as:  ZYRTEC Take 10 mg by mouth daily as needed for allergies.   Compressor/Nebulizer Misc 1.25 mg by Does not  apply route 2 (two) times daily.   furosemide 40 MG tablet Commonly known as:  LASIX Take 1 tablet (40 mg total) by mouth daily.   hydrOXYzine 10 MG tablet Commonly known as:  ATARAX/VISTARIL Take 10 mg by mouth 3 (three) times daily as needed (migraines).   levalbuterol 1.25 MG/0.5ML nebulizer solution Commonly known as:  XOPENEX Take 1.25 mg by nebulization 2 (two) times daily.   metoprolol tartrate 25 MG tablet Commonly known as:  LOPRESSOR Take 0.5 tablets (12.5 mg total) by mouth 2 (two) times daily.   multivitamin tablet Take 1 tablet by mouth 2 (two) times daily as needed.   oxyCODONE 5 MG immediate release tablet Commonly known as:  Oxy IR/ROXICODONE Take 1 tablet (5 mg total) by mouth every 4 (four) hours  as needed for severe pain.   Potassium Chloride ER 20 MEQ Tbcr Take 20 mEq by mouth daily.   pramipexole 1 MG tablet Commonly known as:  MIRAPEX Take 1 mg by mouth 2 (two) times daily as needed.   promethazine 12.5 MG tablet Commonly known as:  PHENERGAN Take 1-2 tablets by mouth 3 (three) times daily as needed for nausea/vomiting.   SUMAtriptan 6 MG/0.5ML Soaj Inject one at onset of severe headache; may repeat once in 2 hours if HA persists   tiZANidine 2 MG tablet Commonly known as:  ZANAFLEX Take 2 mg by mouth every 6 (six) hours as needed (headache).   Vitamin D 2000 units Caps Take 2,000 Units by mouth daily.            Durable Medical Equipment        Start     Ordered   01/01/17 1335  For home use only DME Nebulizer machine  Once    Question:  Patient needs a nebulizer to treat with the following condition  Answer:  Mitral valve obstruction (rheumatic)   01/01/17 1340      Follow Up Appointments: Follow-up Information    End, Harrell Gave, MD Follow up.   Specialty:  Cardiology Contact information: [address] [place] [address] [phone]        Ivin Poot, MD Follow up on 02/02/2017.   Specialty:  Cardiothoracic Surgery Why:  PA/LAT CXR to be taken at [facility] (which is in the same building as Dr. Lucianne Lei Trigt's office) on 02/02/2017 at 12:15 pm ;Appointment time is at 12:45 pm Contact information: [address] [place] [address] [phone]        Maurice Small, MD. Call in 1 day(s).   Specialty:  Family Medicine Contact information: [address] [place] [address] [phone]           Signed: Alvester Chou 01/02/2017, 10:33 AM

## 2016-12-31 NOTE — Progress Notes (Addendum)
3 Days Post-Op Procedure(s) (LRB): EXCISION OF LEFT ATRIAL MYXOMA (Left) INTRAOPERATIVE TRANSESOPHAGEAL ECHOCARDIOGRAM (N/A) Subjective: Resection of 6 cm myxoma with mitral valve obstruction nsr Ambulating  tx to tele bed Objective: Vital signs in last 24 hours: Temp:  [97.7 F (36.5 C)-98.6 F (37 C)] 97.7 F (36.5 C) (10/12 0700) Pulse Rate:  [74-84] 78 (10/11 1600) Cardiac Rhythm: Normal sinus rhythm (10/12 0746) Resp:  [13-25] 16 (10/12 0746) BP: (90-116)/(48-70) 104/60 (10/12 0746) SpO2:  [98 %-99 %] 98 % (10/12 0746) Weight:  [226 lb 13.7 oz (102.9 kg)] 226 lb 13.7 oz (102.9 kg) (10/12 0610)  Hemodynamic parameters for last 24 hours:   stable Intake/Output from previous day: 10/11 0701 - 10/12 0700 In: 194.1 [I.V.:94.1; IV Piggyback:100] Out: 1115 [Urine:995; Chest Tube:120] Intake/Output this shift: Total I/O In: 240 [P.O.:240] Out: -       Exam    General- alert and comfortable   Lungs- clear without rales, wheezes   Cor- regular rate and rhythm, no murmur , gallop   Abdomen- soft, non-tender   Extremities - warm, non-tender, minimal edema   Neuro- oriented, appropriate, no focal weakness    Lab Results:  Recent Labs  12/29/16 1719 12/30/16 0301  WBC 12.9* 10.9*  HGB 9.6* 8.2*  HCT 31.1* 26.2*  PLT 186 160   BMET:  Recent Labs  12/29/16 0332 12/29/16 1703 12/29/16 1719 12/30/16 0301  NA 134* 134*  --  132*  K 4.0 4.0  --  3.8  CL 104 97*  --  102  CO2 23  --   --  26  GLUCOSE 120* 120*  --  104*  BUN 6 4*  --  6  CREATININE 0.50 0.40* 0.52 0.53  CALCIUM 8.0*  --   --  8.0*    PT/INR:  Recent Labs  12/28/16 1337  LABPROT 16.7*  INR 1.37   ABG    Component Value Date/Time   PHART 7.371 12/28/2016 1801   HCO3 22.8 12/28/2016 1801   TCO2 23 12/29/2016 1703   ACIDBASEDEF 2.0 12/28/2016 1801   O2SAT 98.0 12/28/2016 1801   CBG (last 3)   Recent Labs  12/29/16 0814 12/29/16 1155 12/29/16 1553  GLUCAP 106* 106* 115*     Assessment/Plan: S/P Procedure(s) (LRB): EXCISION OF LEFT ATRIAL MYXOMA (Left) INTRAOPERATIVE TRANSESOPHAGEAL ECHOCARDIOGRAM (N/A) Mobilize Diuresis postop expected anemia - po iron DC epws sat, home Sunday if patient has power at home   LOS: 7 days    Melissa [name] 12/31/2016

## 2016-12-31 NOTE — Progress Notes (Signed)
      [address]       [place] [address]             [phone]      POD # 3 excision of left atrial myxoma  BP 99/62 (BP Location: Left Arm)   Pulse 78   Temp 98.8 F (37.1 C) (Oral)   Resp 18   Ht 5\' 7"  (1.702 m)   Wt 226 lb 13.7 oz (102.9 kg)   SpO2 97%   BMI 35.53 kg/m    Intake/Output Summary (Last 24 hours) at 12/31/16 1731 Last data filed at 12/31/16 0800  Gross per 24 hour  Intake              240 ml  Output                0 ml  Net              240 ml    Stable day  Remo Lipps C. Roxan Hockey, MD [facility] [phone]

## 2017-01-01 ENCOUNTER — Inpatient Hospital Stay (HOSPITAL_COMMUNITY): Payer: 59

## 2017-01-01 LAB — CBC
HCT: 24.4 % — ABNORMAL LOW (ref 36.0–46.0)
Hemoglobin: 7.9 g/dL — ABNORMAL LOW (ref 12.0–15.0)
MCH: 25.6 pg — ABNORMAL LOW (ref 26.0–34.0)
MCHC: 32.4 g/dL (ref 30.0–36.0)
MCV: 79.2 fL (ref 78.0–100.0)
Platelets: 226 10*3/uL (ref 150–400)
RBC: 3.08 MIL/uL — ABNORMAL LOW (ref 3.87–5.11)
RDW: 16.8 % — ABNORMAL HIGH (ref 11.5–15.5)
WBC: 9.5 10*3/uL (ref 4.0–10.5)

## 2017-01-01 LAB — BASIC METABOLIC PANEL
Anion gap: 9 (ref 5–15)
BUN: 9 mg/dL (ref 6–20)
CO2: 24 mmol/L (ref 22–32)
Calcium: 8.1 mg/dL — ABNORMAL LOW (ref 8.9–10.3)
Chloride: 103 mmol/L (ref 101–111)
Creatinine, Ser: 0.5 mg/dL (ref 0.44–1.00)
GFR calc Af Amer: >60 mL/min (ref >60)
GFR calc non Af Amer: >60 mL/min (ref >60)
Glucose, Bld: 97 mg/dL (ref 65–99)
Potassium: 3.4 mmol/L — ABNORMAL LOW (ref 3.5–5.1)
Sodium: 136 mmol/L (ref 135–145)

## 2017-01-01 MED ORDER — POTASSIUM CHLORIDE CRYS ER 20 MEQ PO TBCR
20.0000 meq | EXTENDED_RELEASE_TABLET | ORAL | Status: AC
  Administered 2017-01-01 (×3): 20 meq via ORAL
  Filled 2017-01-01 (×3): qty 1

## 2017-01-01 NOTE — Progress Notes (Signed)
4 Days Post-Op Procedure(s) (LRB): EXCISION OF LEFT ATRIAL MYXOMA (Left) INTRAOPERATIVE TRANSESOPHAGEAL ECHOCARDIOGRAM (N/A) Subjective: No complaints  Objective: Vital signs in last 24 hours: Temp:  [97.9 F (36.6 C)-98.9 F (37.2 C)] 97.9 F (36.6 C) (10/13 0800) Pulse Rate:  [64-80] 64 (10/13 0736) Cardiac Rhythm: Normal sinus rhythm (10/13 0400) Resp:  [15-23] 17 (10/13 0736) BP: (99-122)/(62-71) 118/70 (10/13 0400) SpO2:  [97 %-100 %] 97 % (10/13 0740) Weight:  [227 lb 8 oz (103.2 kg)] 227 lb 8 oz (103.2 kg) (10/13 0500)  Hemodynamic parameters for last 24 hours:    Intake/Output from previous day: 10/12 0701 - 10/13 0700 In: 1080 [P.O.:1080] Out: 700 [Urine:700] Intake/Output this shift: No intake/output data recorded.  General appearance: alert, cooperative and no distress Neurologic: intact Heart: regular rate and rhythm Lungs: diminished breath sounds bibasilar  Lab Results:  Recent Labs  12/30/16 0301 01/01/17 0127  WBC 10.9* 9.5  HGB 8.2* 7.9*  HCT 26.2* 24.4*  PLT 160 226   BMET:  Recent Labs  12/30/16 0301 01/01/17 0127  NA 132* 136  K 3.8 3.4*  CL 102 103  CO2 26 24  GLUCOSE 104* 97  BUN 6 9  CREATININE 0.53 0.50  CALCIUM 8.0* 8.1*    PT/INR: No results for input(s): LABPROT, INR in the last 72 hours. ABG    Component Value Date/Time   PHART 7.371 12/28/2016 1801   HCO3 22.8 12/28/2016 1801   TCO2 23 12/29/2016 1703   ACIDBASEDEF 2.0 12/28/2016 1801   O2SAT 98.0 12/28/2016 1801   CBG (last 3)   Recent Labs  12/29/16 1155 12/29/16 1553  GLUCAP 106* 115*    Assessment/Plan: S/P Procedure(s) (LRB): EXCISION OF LEFT ATRIAL MYXOMA (Left) INTRAOPERATIVE TRANSESOPHAGEAL ECHOCARDIOGRAM (N/A) -Doing well  In SR- dc EPW Continue cardiac rehab Home in AM   LOS: 8 days    Melrose Nakayama 01/01/2017

## 2017-01-01 NOTE — Care Management Note (Signed)
Case Management Note  Patient Details  Name: Melissa [name] MRN: [id number] Date of Birth: [birth year]  Subjective/Objective:      Made [facility] aware of need for DME              Action/Plan:CM will sign off for now but will be available should additional discharge needs arise or disposition change.    Expected Discharge Date:                  Expected Discharge Plan:  Home/Self Care  In-House Referral:     Discharge planning Services  CM Consult  Post Acute Care Choice:  Durable Medical Equipment Choice offered to:  Patient  DME Arranged:  Nebulizer/meds DME Agency:  [place]:    [place] Agency:     Status of Service:  Completed, signed off  If discussed at [place] of Stay Meetings, dates discussed:    Additional Comments:  Delrae Sawyers, RN 01/01/2017, 1:40 PM

## 2017-01-01 NOTE — Progress Notes (Signed)
      [address]       [place] [address]             [phone]      No issues   Awaiting tele bed  Remo Lipps C. Roxan Hockey, MD [facility] [phone]

## 2017-01-02 MED ORDER — METOPROLOL TARTRATE 25 MG PO TABS: 12.5000 mg | ORAL_TABLET | Freq: Two times a day (BID) | ORAL | 1 refills | Status: DC

## 2017-01-02 MED ORDER — COMPRESSOR/NEBULIZER MISC: 1.2500 mg | Freq: Two times a day (BID) | 0 refills | Status: DC

## 2017-01-02 MED ORDER — FUROSEMIDE 40 MG PO TABS: 40.0000 mg | ORAL_TABLET | Freq: Every day | ORAL | 0 refills | Status: DC

## 2017-01-02 MED ORDER — ASPIRIN 325 MG PO TBEC: 325.0000 mg | DELAYED_RELEASE_TABLET | Freq: Every day | ORAL | 0 refills | Status: DC

## 2017-01-02 MED ORDER — LEVALBUTEROL HCL 1.25 MG/0.5ML IN NEBU: 1.2500 mg | INHALATION_SOLUTION | Freq: Two times a day (BID) | RESPIRATORY_TRACT | 1 refills | Status: DC

## 2017-01-02 MED ORDER — POTASSIUM CHLORIDE ER 20 MEQ PO TBCR: 20.0000 meq | EXTENDED_RELEASE_TABLET | Freq: Every day | ORAL | 0 refills | Status: DC

## 2017-01-02 MED ORDER — OXYCODONE HCL 5 MG PO TABS: 5.0000 mg | ORAL_TABLET | ORAL | 0 refills | Status: DC | PRN

## 2017-01-02 MED ORDER — ACETAMINOPHEN 500 MG PO TABS: 1000.0000 mg | ORAL_TABLET | Freq: Three times a day (TID) | ORAL | 0 refills | Status: DC | PRN

## 2017-01-02 NOTE — Progress Notes (Signed)
Discharged to home with family office visits in place teaching done  

## 2017-01-02 NOTE — Progress Notes (Signed)
      [address]       [place] [address]             [phone]      5 Days Post-Op Procedure(s) (LRB): EXCISION OF LEFT ATRIAL MYXOMA (Left) INTRAOPERATIVE TRANSESOPHAGEAL ECHOCARDIOGRAM (N/A) Subjective: No issues overnight. Feels good this morning. Does still have some lower leg edema.   Objective: Vital signs in last 24 hours: Temp:  [97.9 F (36.6 C)] 97.9 F (36.6 C) (10/13 1600) Pulse Rate:  [63-82] 72 (10/14 0400) Cardiac Rhythm: Normal sinus rhythm (10/14 0701) Resp:  [13-25] 17 (10/14 0400) BP: (80-126)/(51-78) 122/72 (10/13 2000) SpO2:  [95 %-100 %] 98 % (10/14 0400) Weight:  [229 lb 4.8 oz (104 kg)] 229 lb 4.8 oz (104 kg) (10/14 0400)     Intake/Output from previous day: 10/13 0701 - 10/14 0700 In: 480 [P.O.:480] Out: -  Intake/Output this shift: No intake/output data recorded.  General appearance: alert, cooperative and no distress Heart: regular rate and rhythm, S1, S2 normal, no murmur, click, rub or gallop Lungs: clear to auscultation bilaterally Abdomen: soft, non-tender; bowel sounds normal; no masses,  no organomegaly Extremities: 1+ non pitting pedal edema Wound: clean and dry  Lab Results:  Recent Labs  01/01/17 0127  WBC 9.5  HGB 7.9*  HCT 24.4*  PLT 226   BMET:  Recent Labs  01/01/17 0127  NA 136  K 3.4*  CL 103  CO2 24  GLUCOSE 97  BUN 9  CREATININE 0.50  CALCIUM 8.1*    PT/INR: No results for input(s): LABPROT, INR in the last 72 hours. ABG    Component Value Date/Time   PHART 7.371 12/28/2016 1801   HCO3 22.8 12/28/2016 1801   TCO2 23 12/29/2016 1703   ACIDBASEDEF 2.0 12/28/2016 1801   O2SAT 98.0 12/28/2016 1801   CBG (last 3)  No results for input(s): GLUCAP in the last 72 hours.  Assessment/Plan: S/P Procedure(s) (LRB): EXCISION OF LEFT ATRIAL MYXOMA (Left) INTRAOPERATIVE TRANSESOPHAGEAL ECHOCARDIOGRAM (N/A)  1. CV-NSR in the 86s. BP well controlled on low-dose BB.  2. Pulm-tolerating  room air with good oxygen saturation. CXR yesterday showed no pneumothorax, mild bibasilar atelectasis, and minimal pleural effusions.  3. Renal-creatinine 0.50, electrolytes okay. Making good urine. Did gain 2lbs overnight-will require lasix for a few more days at home.  4. Endo-blood glucose level well controlled. 5. Slight drop in H and H from 8.2/26.2 to 7.9/24.4, however asymptomatic   Plan: Discharge home today.     LOS: 9 days    Elgie Collard 01/02/2017

## 2017-01-03 ENCOUNTER — Telehealth (HOSPITAL_COMMUNITY): Payer: Self-pay | Admitting: Nurse Practitioner

## 2017-01-03 DIAGNOSIS — J45909 Unspecified asthma, uncomplicated: Secondary | ICD-10-CM | POA: Diagnosis not present

## 2017-01-04 ENCOUNTER — Other Ambulatory Visit: Payer: Self-pay | Admitting: *Deleted

## 2017-01-04 DIAGNOSIS — R600 Localized edema: Secondary | ICD-10-CM

## 2017-01-04 MED ORDER — FUROSEMIDE 40 MG PO TABS: 40.0000 mg | ORAL_TABLET | Freq: Every day | ORAL | 0 refills | Status: DC

## 2017-01-04 MED ORDER — POTASSIUM CHLORIDE ER 20 MEQ PO TBCR: 20.0000 meq | EXTENDED_RELEASE_TABLET | Freq: Every day | ORAL | 0 refills | Status: DC

## 2017-01-05 DIAGNOSIS — Z09 Encounter for follow-up examination after completed treatment for conditions other than malignant neoplasm: Secondary | ICD-10-CM | POA: Diagnosis not present

## 2017-01-05 DIAGNOSIS — I5022 Chronic systolic (congestive) heart failure: Secondary | ICD-10-CM | POA: Diagnosis not present

## 2017-01-05 DIAGNOSIS — Z23 Encounter for immunization: Secondary | ICD-10-CM | POA: Diagnosis not present

## 2017-01-05 DIAGNOSIS — D151 Benign neoplasm of heart: Secondary | ICD-10-CM | POA: Diagnosis not present

## 2017-01-05 DIAGNOSIS — L03317 Cellulitis of buttock: Secondary | ICD-10-CM | POA: Diagnosis not present

## 2017-01-07 ENCOUNTER — Other Ambulatory Visit: Payer: Self-pay | Admitting: *Deleted

## 2017-01-07 DIAGNOSIS — G8918 Other acute postprocedural pain: Secondary | ICD-10-CM

## 2017-01-07 MED ORDER — TRAMADOL HCL 50 MG PO TABS: 50.0000 mg | ORAL_TABLET | Freq: Four times a day (QID) | ORAL | 0 refills | Status: DC | PRN

## 2017-01-08 ENCOUNTER — Encounter (HOSPITAL_COMMUNITY): Payer: Self-pay | Admitting: Emergency Medicine

## 2017-01-08 ENCOUNTER — Inpatient Hospital Stay (HOSPITAL_COMMUNITY)
Admission: EM | Admit: 2017-01-08 | Discharge: 2017-01-11 | DRG: 310 | Disposition: A | Discharge status: Home / Self Care | Payer: 59 | Attending: Internal Medicine | Admitting: Internal Medicine

## 2017-01-08 ENCOUNTER — Emergency Department (HOSPITAL_COMMUNITY): Payer: 59

## 2017-01-08 DIAGNOSIS — Z91018 Allergy to other foods: Secondary | ICD-10-CM

## 2017-01-08 DIAGNOSIS — J309 Allergic rhinitis, unspecified: Secondary | ICD-10-CM | POA: Diagnosis present

## 2017-01-08 DIAGNOSIS — D6489 Other specified anemias: Secondary | ICD-10-CM

## 2017-01-08 DIAGNOSIS — D649 Anemia, unspecified: Secondary | ICD-10-CM | POA: Diagnosis not present

## 2017-01-08 DIAGNOSIS — Z7982 Long term (current) use of aspirin: Secondary | ICD-10-CM

## 2017-01-08 DIAGNOSIS — Z79899 Other long term (current) drug therapy: Secondary | ICD-10-CM

## 2017-01-08 DIAGNOSIS — Z90710 Acquired absence of both cervix and uterus: Secondary | ICD-10-CM

## 2017-01-08 DIAGNOSIS — I4891 Unspecified atrial fibrillation: Secondary | ICD-10-CM | POA: Diagnosis not present

## 2017-01-08 DIAGNOSIS — R0602 Shortness of breath: Secondary | ICD-10-CM | POA: Diagnosis not present

## 2017-01-08 DIAGNOSIS — G2581 Restless legs syndrome: Secondary | ICD-10-CM | POA: Diagnosis present

## 2017-01-08 DIAGNOSIS — R600 Localized edema: Secondary | ICD-10-CM | POA: Diagnosis present

## 2017-01-08 DIAGNOSIS — Z87891 Personal history of nicotine dependence: Secondary | ICD-10-CM

## 2017-01-08 DIAGNOSIS — R000 Tachycardia, unspecified: Secondary | ICD-10-CM

## 2017-01-08 DIAGNOSIS — Z6837 Body mass index (BMI) 37.0-37.9, adult: Secondary | ICD-10-CM

## 2017-01-08 DIAGNOSIS — Z9884 Bariatric surgery status: Secondary | ICD-10-CM

## 2017-01-08 DIAGNOSIS — Z86018 Personal history of other benign neoplasm: Secondary | ICD-10-CM

## 2017-01-08 DIAGNOSIS — Z96659 Presence of unspecified artificial knee joint: Secondary | ICD-10-CM | POA: Diagnosis present

## 2017-01-08 DIAGNOSIS — J45909 Unspecified asthma, uncomplicated: Secondary | ICD-10-CM | POA: Diagnosis present

## 2017-01-08 DIAGNOSIS — E669 Obesity, unspecified: Secondary | ICD-10-CM | POA: Diagnosis present

## 2017-01-08 LAB — CBC WITH DIFFERENTIAL/PLATELET
Basophils Absolute: 0.1 10*3/uL (ref 0.0–0.1)
Basophils Relative: 1 %
EOS ABS: 0.4 10*3/uL (ref 0.0–0.7)
EOS PCT: 3 %
HCT: 25.8 % — ABNORMAL LOW (ref 36.0–46.0)
Hemoglobin: 8 g/dL — ABNORMAL LOW (ref 12.0–15.0)
LYMPHS ABS: 2.1 10*3/uL (ref 0.7–4.0)
Lymphocytes Relative: 16 %
MCH: 24.7 pg — AB (ref 26.0–34.0)
MCHC: 31 g/dL (ref 30.0–36.0)
MCV: 79.6 fL (ref 78.0–100.0)
Monocytes Absolute: 1 10*3/uL (ref 0.1–1.0)
Monocytes Relative: 8 %
Neutro Abs: 9.3 10*3/uL — ABNORMAL HIGH (ref 1.7–7.7)
Neutrophils Relative %: 72 %
PLATELETS: 547 10*3/uL — AB (ref 150–400)
RBC: 3.24 MIL/uL — AB (ref 3.87–5.11)
RDW: 16.5 % — AB (ref 11.5–15.5)
WBC: 12.9 10*3/uL — AB (ref 4.0–10.5)

## 2017-01-08 LAB — COMPREHENSIVE METABOLIC PANEL
ALT: 30 U/L (ref 14–54)
ANION GAP: 9 (ref 5–15)
AST: 29 U/L (ref 15–41)
Albumin: 2.7 g/dL — ABNORMAL LOW (ref 3.5–5.0)
Alkaline Phosphatase: 88 U/L (ref 38–126)
BUN: 8 mg/dL (ref 6–20)
CHLORIDE: 102 mmol/L (ref 101–111)
CO2: 25 mmol/L (ref 22–32)
CREATININE: 0.52 mg/dL (ref 0.44–1.00)
Calcium: 8.2 mg/dL — ABNORMAL LOW (ref 8.9–10.3)
GFR calc non Af Amer: >60 mL/min (ref >60)
Glucose, Bld: 108 mg/dL — ABNORMAL HIGH (ref 65–99)
POTASSIUM: 3.4 mmol/L — AB (ref 3.5–5.1)
SODIUM: 136 mmol/L (ref 135–145)
Total Bilirubin: 0.4 mg/dL (ref 0.3–1.2)
Total Protein: 5.7 g/dL — ABNORMAL LOW (ref 6.5–8.1)

## 2017-01-08 LAB — BRAIN NATRIURETIC PEPTIDE: B NATRIURETIC PEPTIDE 5: 383.4 pg/mL — AB (ref 0.0–100.0)

## 2017-01-08 LAB — MAGNESIUM: MAGNESIUM: 2.1 mg/dL (ref 1.7–2.4)

## 2017-01-08 MED ORDER — MOMETASONE FUROATE 200 MCG/ACT IN AERO: 200.0000 ug | INHALATION_SPRAY | Freq: Two times a day (BID) | RESPIRATORY_TRACT | Status: DC

## 2017-01-08 MED ORDER — RIVAROXABAN 20 MG PO TABS
20.0000 mg | ORAL_TABLET | Freq: Every day | ORAL | Status: DC
  Administered 2017-01-08: 20 mg via ORAL
  Filled 2017-01-08: qty 1

## 2017-01-08 MED ORDER — LORATADINE 10 MG PO TABS: 10.0000 mg | ORAL_TABLET | Freq: Every day | ORAL | Status: DC | PRN

## 2017-01-08 MED ORDER — SODIUM CHLORIDE 0.9% FLUSH: 3.0000 mL | INTRAVENOUS | Status: DC | PRN

## 2017-01-08 MED ORDER — DILTIAZEM HCL 100 MG IV SOLR
5.0000 mg/h | INTRAVENOUS | Status: DC
  Administered 2017-01-08: 5 mg/h via INTRAVENOUS
  Administered 2017-01-09: 15 mg/h via INTRAVENOUS
  Filled 2017-01-08 (×8): qty 100

## 2017-01-08 MED ORDER — DILTIAZEM LOAD VIA INFUSION
15.0000 mg | Freq: Once | INTRAVENOUS | Status: AC
  Administered 2017-01-08: 15 mg via INTRAVENOUS
  Filled 2017-01-08: qty 15

## 2017-01-08 MED ORDER — FLUTICASONE PROPIONATE 50 MCG/ACT NA SUSP
1.0000 | Freq: Every day | NASAL | Status: DC
  Filled 2017-01-08: qty 16

## 2017-01-08 MED ORDER — PRAMIPEXOLE DIHYDROCHLORIDE 1 MG PO TABS
1.0000 mg | ORAL_TABLET | Freq: Two times a day (BID) | ORAL | Status: DC | PRN
  Administered 2017-01-09: 1 mg via ORAL
  Filled 2017-01-08: qty 1

## 2017-01-08 MED ORDER — SODIUM CHLORIDE 0.9% FLUSH: 3.0000 mL | Freq: Two times a day (BID) | INTRAVENOUS | Status: DC

## 2017-01-08 MED ORDER — ASPIRIN EC 81 MG PO TBEC
81.0000 mg | DELAYED_RELEASE_TABLET | Freq: Every day | ORAL | Status: DC
  Administered 2017-01-08: 81 mg via ORAL
  Filled 2017-01-08: qty 1

## 2017-01-08 MED ORDER — SODIUM CHLORIDE 0.9 % IV SOLN: 250.0000 mL | INTRAVENOUS | Status: DC | PRN

## 2017-01-08 MED ORDER — ACETAMINOPHEN 500 MG PO TABS: 1000.0000 mg | ORAL_TABLET | Freq: Three times a day (TID) | ORAL | Status: DC | PRN

## 2017-01-08 NOTE — H&P (Signed)
CARDIOLOGY HISTORY & PHYSICAL   Referring Physician: Dr. Leonette Monarch, ER Primary Physician: Dr. Justin Mend Primary Cardiologist: [name] Reason for Admission: new onset atrial fibrillation with RVR   HPI: Melissa [name] is a 51 yo woman with PMH of left atrial myxoma s/p resection 12/28/16 who presents with new palpitations. In general, she had been feeling well until this afternoon, when she acutely developed palpitations and "thumping" in her chest. She has never had this sensation before. She was found to be in atrial fibrillation with RVR by EMS and was brought to [facility] ER for evaluation.   She denied fevers, chills. No shortness of breath. Has been increasing activity as tolerated at home postoperatively. No issues with sternal wound other than mild MSK discomfort postop, stable.  Denies any bleeding issues, including melena/hematochezia or hematuria. Appetite fair.  In the ER, her heart rates were in the 130s-140s but BP stable at 131/74. Labs notable for K 3.4, Cr 0.52, BNP 383, WBC 12.9, Hgb 8.0 (was 7.9 at recent discharge), plt 547.   Review of Systems:     Cardiac Review of Systems: {Y] = yes [ ]  = no  Chest Pain [   N ]  Resting SOB [ N  ] Exertional SOB  [place].Slicker  ]  Orthopnea [ N ]   Pedal Edema [ N  ]    Palpitations [ Y ] Syncope  [ N ]   Presyncope [ N  ]  General Review of Systems: [Y] = yes [  ]=no Constitional: recent weight change [  ]; anorexia [  ]; fatigue [  ]; nausea [  ]; night sweats [  ]; fever [  ]; or chills [  ];                                                                     Eyes : blurred vision [  ]; diplopia [   ]; vision changes [  ];  Amaurosis fugax[  ]; Resp: cough [  ];  wheezing[  ];  hemoptysis[  ];  PND [  ];  GI:  gallstones[  ], vomiting[  ];  dysphagia[  ]; melena[  ];  hematochezia [  ]; heartburn[  ];   GU: kidney stones [  ]; hematuria[  ];   dysuria [  ];  nocturia[  ]; incontinence [  ];             Skin: rash, swelling[  ];, hair loss[  ];   peripheral edema[  ];  or itching[  ]; Musculosketetal: myalgias[  ];  joint swelling[  ];  joint erythema[  ];  joint pain[  ];  back pain[  ];  Heme/Lymph: bruising[  ];  bleeding[  ];  anemia[ Y ];  Neuro: TIA[  ];  headaches[  ];  stroke[  ];  vertigo[  ];  seizures[  ];   paresthesias[  ];  difficulty walking[  ];  Psych:depression[  ]; anxiety[  ];  Endocrine: diabetes[  ];  thyroid dysfunction[  ];  Other:  Past Medical History:  Diagnosis Date  . ADD (attention deficit disorder)   . Allergy   . Arthritis   . Asthma   .  Depression   . Migraine   . Osteoporosis   . RLS (restless legs syndrome)     Medications Prior to Admission  Medication Sig Dispense Refill  . acetaminophen (TYLENOL) 500 MG tablet Take 2 tablets (1,000 mg total) by mouth every 8 (eight) hours as needed. 30 tablet 0  . albuterol (PROVENTIL HFA) 108 (90 Base) MCG/ACT inhaler Inhale 2 puffs into the lungs as needed.    Marland Kitchen aspirin 325 MG EC tablet Take 1 tablet (325 mg total) by mouth daily. (Patient taking differently: Take 325 mg by mouth at bedtime. ) 30 tablet 0  . cetirizine (ZYRTEC) 10 MG tablet Take 10 mg by mouth daily as needed for allergies.     . Cholecalciferol (VITAMIN D) 2000 units CAPS Take 2,000 Units by mouth daily.    . furosemide (LASIX) 40 MG tablet Take 1 tablet (40 mg total) by mouth daily. 14 tablet 0  . hydrOXYzine (ATARAX/VISTARIL) 10 MG tablet Take 10 mg by mouth 3 (three) times daily as needed (migraines).    Marland Kitchen levalbuterol (XOPENEX) 1.25 MG/0.5ML nebulizer solution Take 1.25 mg by nebulization 2 (two) times daily. 1 each 1  . metoprolol tartrate (LOPRESSOR) 25 MG tablet Take 0.5 tablets (12.5 mg total) by mouth 2 (two) times daily. 60 tablet 1  . Mometasone Furoate (ASMANEX HFA) 200 MCG/ACT AERO Inhale 200 mcg into the lungs 2 (two) times daily.    . Multiple Vitamin (MULTIVITAMIN) tablet Take 1 tablet by mouth 2 (two) times daily as needed.     . Nebulizers (COMPRESSOR/NEBULIZER) MISC  1.25 mg by Does not apply route 2 (two) times daily. 1 each 0  . oxyCODONE (OXY IR/ROXICODONE) 5 MG immediate release tablet Take 1 tablet (5 mg total) by mouth every 4 (four) hours as needed for severe pain. 30 tablet 0  . Potassium Chloride ER 20 MEQ TBCR Take 20 mEq by mouth daily. 14 tablet 0  . pramipexole (MIRAPEX) 1 MG tablet Take 1 mg by mouth 2 (two) times daily as needed.    . promethazine (PHENERGAN) 12.5 MG tablet Take 1-2 tablets by mouth 3 (three) times daily as needed for nausea/vomiting.  5  . SUMAtriptan 6 MG/0.5ML SOAJ Inject one at onset of severe headache; may repeat once in 2 hours if HA persists    . tiZANidine (ZANAFLEX) 2 MG tablet Take 2 mg by mouth every 6 (six) hours as needed (headache).   5  . traMADol (ULTRAM) 50 MG tablet Take 1 tablet (50 mg total) by mouth every 6 (six) hours as needed. 30 tablet 0     . aspirin EC  81 mg Oral Daily  . fluticasone  1 spray Each Nare Daily  . rivaroxaban  20 mg Oral Q supper  . sodium chloride flush  3 mL Intravenous Q12H    Infusions: . sodium chloride    . diltiazem (CARDIZEM) infusion 15 mg/hr (01/08/17 2150)    Allergies  Allergen Reactions  . Other Anaphylaxis and Other (See Comments)    HONEYDEW MELON    Social History   Social History  . Marital status: Married    Spouse name: N/A  . Number of children: N/A  . Years of education: N/A   Occupational History  . Not on file.   Social History Main Topics  . Smoking status: Former Smoker    Packs/day: 0.25    Years: 10.00    Types: Cigarettes    Quit date: 1997  . Smokeless tobacco: Never Used  .  Alcohol use No     Comment: Less than once a week.  . Drug use: No  . Sexual activity: Not on file   Other Topics Concern  . Not on file   Social History Narrative   Married.    Family History  Problem Relation Age of Onset  . Breast cancer Mother   . Breast cancer Sister   . Breast cancer Maternal Aunt   . Breast cancer Maternal Grandmother     . Breast cancer Maternal Aunt   . Depression Other        FMHx  . Stroke Paternal Grandfather     PHYSICAL EXAM: Vitals:   01/08/17 2150 01/09/17 0030  BP: 120/73 114/67  Pulse:    Resp:    Temp: 98.4 F (36.9 C) 98.4 F (36.9 C)  SpO2: 98% 95%    No intake or output data in the 24 hours ending 01/09/17 0042  General:  Well appearing. No respiratory difficulty HEENT: normal Neck: supple. no JVD. Carotids 2+ bilat; no bruits. No lymphadenopathy or thryomegaly appreciated. Cor: PMI nondisplaced. Well healing sternal wound, c/d/i. Irregularly irregular, tachycardic heart rate without apparent rubs, gallops or murmurs. Lungs: soft bibasilar crackles Abdomen: soft, nontender, nondistended. No hepatosplenomegaly. No bruits or masses. Good bowel sounds. Extremities: no cyanosis, clubbing, rash, edema Neuro: alert & oriented x 3, cranial nerves grossly intact. moves all 4 extremities w/o difficulty. Affect pleasant.  ECG: afib with RVR  Results for orders placed or performed during the hospital encounter of 01/08/17 (from the past 24 hour(s))  CBC with Differential     Status: Abnormal   Collection Time: 01/08/17  5:41 PM  Result Value Ref Range   WBC 12.9 (H) 4.0 - 10.5 K/uL   RBC 3.24 (L) 3.87 - 5.11 MIL/uL   Hemoglobin 8.0 (L) 12.0 - 15.0 g/dL   HCT 25.8 (L) 36.0 - 46.0 %   MCV 79.6 78.0 - 100.0 fL   MCH 24.7 (L) 26.0 - 34.0 pg   MCHC 31.0 30.0 - 36.0 g/dL   RDW 16.5 (H) 11.5 - 15.5 %   Platelets 547 (H) 150 - 400 K/uL   Neutrophils Relative % 72 %   Neutro Abs 9.3 (H) 1.7 - 7.7 K/uL   Lymphocytes Relative 16 %   Lymphs Abs 2.1 0.7 - 4.0 K/uL   Monocytes Relative 8 %   Monocytes Absolute 1.0 0.1 - 1.0 K/uL   Eosinophils Relative 3 %   Eosinophils Absolute 0.4 0.0 - 0.7 K/uL   Basophils Relative 1 %   Basophils Absolute 0.1 0.0 - 0.1 K/uL  Comprehensive metabolic panel     Status: Abnormal   Collection Time: 01/08/17  5:41 PM  Result Value Ref Range   Sodium 136  135 - 145 mmol/L   Potassium 3.4 (L) 3.5 - 5.1 mmol/L   Chloride 102 101 - 111 mmol/L   CO2 25 22 - 32 mmol/L   Glucose, Bld 108 (H) 65 - 99 mg/dL   BUN 8 6 - 20 mg/dL   Creatinine, Ser 0.52 0.44 - 1.00 mg/dL   Calcium 8.2 (L) 8.9 - 10.3 mg/dL   Total Protein 5.7 (L) 6.5 - 8.1 g/dL   Albumin 2.7 (L) 3.5 - 5.0 g/dL   AST 29 15 - 41 U/L   ALT 30 14 - 54 U/L   Alkaline Phosphatase 88 38 - 126 U/L   Total Bilirubin 0.4 0.3 - 1.2 mg/dL   GFR calc non Af Amer >60 >  60 mL/min   GFR calc Af Amer >60 >60 mL/min   Anion gap 9 5 - 15  Magnesium     Status: None   Collection Time: 01/08/17  5:41 PM  Result Value Ref Range   Magnesium 2.1 1.7 - 2.4 mg/dL  Brain natriuretic peptide     Status: Abnormal   Collection Time: 01/08/17  5:41 PM  Result Value Ref Range   B Natriuretic Peptide 383.4 (H) 0.0 - 100.0 pg/mL   Dg Chest Port 1 View  Result Date: 01/08/2017 CLINICAL DATA:  Palpitations. Tachycardia. Shortness of breath. Atrial fibrillation. EXAM: PORTABLE CHEST 1 VIEW COMPARISON:  01/01/2017 FINDINGS: Stable mild cardiomegaly.  Previous median sternotomy. Mild bibasilar atelectasis and tiny bilateral pleural effusions show no significant change. No evidence of pulmonary consolidation or edema. No pneumothorax visualized. IMPRESSION: No significant change in mild bibasilar atelectasis and tiny bilateral pleural effusions. Electronically Signed   By: Earle Gell M.D.   On: 01/08/2017 17:31   ASSESSMENT/PLAN: Melissa [name] is a 51 yo woman with PMH of left atrial myxoma s/p resection 12/28/16 who presents with new palpitations, found to be in atrial fibrillation with RVR.  She is hemodynamically stable and tolerating rate well. Afib not unexpected given recent procedure in her left atria.  Plan: -start oral anticoagulation, rivaroxaban 20 mg -given normal ejection fraction and no significant CAD on testing earlier this month, will start diltiazem drip for rate control. Hold lopressor while on drip.  Change to oral medications once rate consistently <100 bpm -consider echo in AM given recent surgery -duration of anticoagulation will be difficult given her recent surgery. While this might be considered postoperative afib, her atrial procedure likely predisposes her to afib. Will need to be discussed long term.  -decrease aspirin from 325 mg to 81 mg given her new anticoagulation -appears euvolemic, lasix PRN -continue home allergy, restless leg medications -headache meds PRN  Buford Dresser, MD, PhD, overnight cardiology provider

## 2017-01-08 NOTE — ED Triage Notes (Signed)
Pt presents to ER via [facility] for palpitations, heart racing; EMS reports pt found to be in afib w/ RVR with SOB, chest discomfort, and clammy skin; pt reports recent surgery with TEE; pt states she takes 325mg  ASA daily

## 2017-01-08 NOTE — ED Provider Notes (Signed)
[facility] EMERGENCY DEPARTMENT Provider Note  CSN: [id number] Arrival date & time: 01/08/17 1702  Chief Complaint(s) Irregular Heart Beat and Chest Pain  HPI Melissa [name] is a 51 y.o. female with a history of left atrial myxoma status post resection on October 9 of this year.  She presents today with new onset A. fib RVR noted by EMS.  She reports that less than 1 hour ago she noted rapid palpitations, mild chest discomfort described as heart thumping.  It is nonradiating, nonexertional.  These have been persistent since onset.  No aggravating or alleviating factors.  She denies any recent fevers, chills, shortness of breath, nausea, vomiting, abdominal pain, diarrhea.  She denies any prolonged immobilization.  Denies any prior history of DVTs or PEs.  HPI  Past Medical History Past Medical History:  Diagnosis Date  . ADD (attention deficit disorder)   . Allergy   . Arthritis   . Asthma   . Depression   . Migraine   . Osteoporosis   . RLS (restless legs syndrome)    Patient Active Problem List   Diagnosis Date Noted  . Atrial mass 12/28/2016  . Acute diastolic heart failure ([place])   . Migraines 12/24/2016  . Left atrial mass 12/24/2016  . (HFpEF) heart failure with preserved ejection fraction ([place]) 12/24/2016  . Asthma without status asthmaticus 12/20/2016  . S/P bariatric surgery 10/21/2016  . Weight gain 10/21/2016  . Iron deficiency 09/28/2016  . BMI 38.0-38.9,adult 09/24/2016  . Dyspnea on exertion 09/24/2016  . Intractable persistent migraine aura without cerebral infarction and without status migrainosus 09/24/2016  . Stress-related physiological response affecting medical condition 09/24/2016  . Vitamin D insufficiency 09/24/2016  . Fibromyalgia 08/28/2015  . Obesity (BMI 30-39.9) 08/28/2015  . Status migrainosus 07/11/2015  . Vitamin D deficiency 03/27/2015  . Migraine without aura, intractable, without status migrainosus 03/13/2015  .  H/O gastric bypass 10/16/2013  . Heartburn 10/16/2013  . Osteoporosis 10/16/2013  . Hair loss 08/28/2012  . Disordered sleep 08/28/2012  . S/P gastric bypass 06/21/2012  . Routine general medical examination at a health care facility 06/21/2012  . Insomnia 06/21/2012  . Anemia, unspecified 01/12/2012  . MENOPAUSE-RELATED VASOMOTOR SYMPTOMS, HOT FLASHES 12/07/2007  . RLS (restless legs syndrome) 10/26/2007  . Allergic rhinitis 12/23/2006  . HEADACHE 12/23/2006  . ALLERGIC RHINITIS, SEASONAL 11/04/2006  . SYMPTOM, DYSFUNCTION, SLEEP STAGE 11/04/2006   Home Medication(s) Prior to Admission medications   Medication Sig Start Date End Date Taking? Authorizing Provider  acetaminophen (TYLENOL) 500 MG tablet Take 2 tablets (1,000 mg total) by mouth every 8 (eight) hours as needed. 01/02/17   Elgie Collard, PA-C  aspirin 325 MG EC tablet Take 1 tablet (325 mg total) by mouth daily. 01/02/17   Elgie Collard, PA-C  cetirizine (ZYRTEC) 10 MG tablet Take 10 mg by mouth daily as needed for allergies.     [provider]  Cholecalciferol (VITAMIN D) 2000 units CAPS Take 2,000 Units by mouth daily.    [provider]  furosemide (LASIX) 40 MG tablet Take 1 tablet (40 mg total) by mouth daily. 01/04/17 01/18/17  Ivin Poot, MD  hydrOXYzine (ATARAX/VISTARIL) 10 MG tablet Take 10 mg by mouth 3 (three) times daily as needed (migraines).    [provider]  levalbuterol (XOPENEX) 1.25 MG/0.5ML nebulizer solution Take 1.25 mg by nebulization 2 (two) times daily. 01/02/17 01/09/17  Elgie Collard, PA-C  metoprolol tartrate (LOPRESSOR) 25 MG tablet Take 0.5 tablets (  12.5 mg total) by mouth 2 (two) times daily. 01/02/17   Elgie Collard, PA-C  Mometasone Furoate [facility] HFA) 200 MCG/ACT AERO Inhale 200 mcg into the lungs 2 (two) times daily.    [provider]  Multiple Vitamin (MULTIVITAMIN) tablet Take 1 tablet by mouth 2 (two) times daily as needed.     [provider]  Nebulizers (COMPRESSOR/NEBULIZER) MISC 1.25 mg by Does not apply route 2 (two) times daily. 01/02/17   Elgie Collard, PA-C  oxyCODONE (OXY IR/ROXICODONE) 5 MG immediate release tablet Take 1 tablet (5 mg total) by mouth every 4 (four) hours as needed for severe pain. 01/02/17   Elgie Collard, PA-C  Potassium Chloride ER 20 MEQ TBCR Take 20 mEq by mouth daily. 01/04/17   Ivin Poot, MD  pramipexole (MIRAPEX) 1 MG tablet Take 1 mg by mouth 2 (two) times daily as needed.    [provider]  promethazine (PHENERGAN) 12.5 MG tablet Take 1-2 tablets by mouth 3 (three) times daily as needed for nausea/vomiting. 12/17/16   [provider]  SUMAtriptan 6 MG/0.5ML SOAJ Inject one at onset of severe headache; may repeat once in 2 hours if HA persists 09/23/16   [provider]  tiZANidine (ZANAFLEX) 2 MG tablet Take 2 mg by mouth every 6 (six) hours as needed (headache).  10/11/15   [provider]  traMADol (ULTRAM) 50 MG tablet Take 1 tablet (50 mg total) by mouth every 6 (six) hours as needed. 01/07/17   Ivin Poot, MD                                                                                                                                    Past Surgical History Past Surgical History:  Procedure Laterality Date  . ABDOMINAL HYSTERECTOMY    . CHOLECYSTECTOMY    . EXCISION OF ATRIAL MYXOMA Left 12/28/2016   Procedure: EXCISION OF LEFT ATRIAL MYXOMA;  Surgeon: Ivin Poot, MD;  Location: [facility];  Service: Open Heart Surgery;  Laterality: Left;  . FOOT SURGERY    . GASTRIC BYPASS    . INTRAOPERATIVE TRANSESOPHAGEAL ECHOCARDIOGRAM N/A 12/28/2016   Procedure: INTRAOPERATIVE TRANSESOPHAGEAL ECHOCARDIOGRAM;  Surgeon: Ivin Poot, MD;  Location: [facility];  Service: Open Heart Surgery;  Laterality: N/A;  . TOTAL KNEE ARTHROPLASTY    . TUBAL LIGATION     Family History Family History  Problem Relation Age of Onset  . Breast cancer  Mother   . Breast cancer Sister   . Breast cancer Maternal Aunt   . Breast cancer Maternal Grandmother   . Breast cancer Maternal Aunt   . Depression Other        FMHx  . Stroke Paternal Grandfather     Social History Social History  Substance Use Topics  . Smoking status: Former Smoker    Packs/day: 0.25    Years: 10.00  Types: Cigarettes    Quit date: 87  . Smokeless tobacco: Never Used  . Alcohol use No     Comment: Less than once a week.   Allergies Other  Review of Systems Review of Systems All other systems are reviewed and are negative for acute change except as noted in the HPI  Physical Exam Vital Signs  I have reviewed the triage vital signs BP 131/74 (BP Location: Right Arm)   Pulse (!) 130   Temp 98.9 F (37.2 C) (Oral)   Resp (!) 25   SpO2 100%   Physical Exam  Constitutional: She is oriented to person, place, and time. She appears well-developed and well-nourished. No distress.  HENT:  Head: Normocephalic and atraumatic.  Nose: Nose normal.  Eyes: Pupils are equal, round, and reactive to light. Conjunctivae and EOM are normal. Right eye exhibits no discharge. Left eye exhibits no discharge. No scleral icterus.  Neck: Normal range of motion. Neck supple.  Cardiovascular: An irregularly irregular rhythm present. Tachycardia present.  Exam reveals no gallop and no friction rub.   No murmur heard. Pulmonary/Chest: Effort normal and breath sounds normal. No stridor. No respiratory distress. She has no rales.  Abdominal: Soft. She exhibits no distension. There is no tenderness.  Musculoskeletal: She exhibits no edema or tenderness.  1+ bilateral lower extremity edema  Neurological: She is alert and oriented to person, place, and time.  Skin: Skin is warm and dry. No rash noted. She is not diaphoretic. No erythema.  Psychiatric: She has a normal mood and affect.  Vitals reviewed.   ED Results and Treatments Labs (all labs ordered are listed, but  only abnormal results are displayed) Labs Reviewed  CBC WITH DIFFERENTIAL/PLATELET  COMPREHENSIVE METABOLIC PANEL  MAGNESIUM  BRAIN NATRIURETIC PEPTIDE                                                                                                                         EKG  EKG Interpretation  Date/Time:  Saturday January 08 2017 17:12:50 EDT Ventricular Rate:  139 PR Interval:    QRS Duration: 59 QT Interval:  347 QTC Calculation: 511 R Axis:   82 Text Interpretation:  Atrial fibrillation Nonspecific T abnrm, anterolateral leads Prolonged QT interval Otherwise no significant change Confirmed by Addison Lank [phone]) on 01/08/2017 5:18:21 PM      Radiology Dg Chest Port 1 View  Result Date: 01/08/2017 CLINICAL DATA:  Palpitations. Tachycardia. Shortness of breath. Atrial fibrillation. EXAM: PORTABLE CHEST 1 VIEW COMPARISON:  01/01/2017 FINDINGS: Stable mild cardiomegaly.  Previous median sternotomy. Mild bibasilar atelectasis and tiny bilateral pleural effusions show no significant change. No evidence of pulmonary consolidation or edema. No pneumothorax visualized. IMPRESSION: No significant change in mild bibasilar atelectasis and tiny bilateral pleural effusions. Electronically Signed   By: Earle Gell M.D.   On: 01/08/2017 17:31   Pertinent labs & imaging results that were available during my care of the patient were reviewed by me and considered  in my medical decision making (see chart for details).  Medications Ordered in ED Medications  diltiazem (CARDIZEM) 1 mg/mL load via infusion 15 mg (not administered)    And  diltiazem (CARDIZEM) 100 mg in dextrose 5 % 100 mL (1 mg/mL) infusion (not administered)                                                                                                                                    Procedures Procedures CRITICAL CARE Performed by: Grayce Sessions Lilianne Delair Total critical care time: 30 minutes Critical care time was  exclusive of separately billable procedures and treating other patients. Critical care was necessary to treat or prevent imminent or life-threatening deterioration. Critical care was time spent personally by me on the following activities: development of treatment plan with patient and/or surrogate as well as nursing, discussions with consultants, evaluation of patient's response to treatment, examination of patient, obtaining history from patient or surrogate, ordering and performing treatments and interventions, ordering and review of laboratory studies, ordering and review of radiographic studies, pulse oximetry and re-evaluation of patient's condition.   (including critical care time)  Medical Decision Making / ED Course I have reviewed the nursing notes for this encounter and the patient's prior records (if available in EHR or on provided paperwork).  Clinical Course as of Jan 08 1800  Sat Jan 08, 2017  1716 Spoke with Dr. Cyndia Bent from [facility] who cleared pt for anticoagulation.  [PC]  [id number] Discuss case with cardiology who recommended diltiazem bolus and drip.  They will admit the patient for further management.   [PC]  1800 Labs currently pending  [PC]    Clinical Course User Index [PC] Felder Lebeda, Grayce Sessions, MD     Final Clinical Impression(s) / ED Diagnoses Final diagnoses:  Tachycardia  Atrial fibrillation with RVR ([place])      This chart was dictated using voice recognition software.  Despite best efforts to proofread,  errors can occur which can change the documentation meaning.   Fatima Blank, MD 01/08/17 [phone]

## 2017-01-08 NOTE — ED Notes (Signed)
Cardiology at bedside.

## 2017-01-09 DIAGNOSIS — I5031 Acute diastolic (congestive) heart failure: Secondary | ICD-10-CM | POA: Diagnosis not present

## 2017-01-09 DIAGNOSIS — R600 Localized edema: Secondary | ICD-10-CM | POA: Diagnosis present

## 2017-01-09 DIAGNOSIS — E669 Obesity, unspecified: Secondary | ICD-10-CM | POA: Diagnosis not present

## 2017-01-09 DIAGNOSIS — G2581 Restless legs syndrome: Secondary | ICD-10-CM | POA: Diagnosis not present

## 2017-01-09 DIAGNOSIS — J45909 Unspecified asthma, uncomplicated: Secondary | ICD-10-CM | POA: Diagnosis not present

## 2017-01-09 DIAGNOSIS — Z90710 Acquired absence of both cervix and uterus: Secondary | ICD-10-CM | POA: Diagnosis not present

## 2017-01-09 DIAGNOSIS — Z7982 Long term (current) use of aspirin: Secondary | ICD-10-CM | POA: Diagnosis not present

## 2017-01-09 DIAGNOSIS — Z9884 Bariatric surgery status: Secondary | ICD-10-CM | POA: Diagnosis not present

## 2017-01-09 DIAGNOSIS — Z96659 Presence of unspecified artificial knee joint: Secondary | ICD-10-CM | POA: Diagnosis not present

## 2017-01-09 DIAGNOSIS — I4891 Unspecified atrial fibrillation: Secondary | ICD-10-CM | POA: Diagnosis not present

## 2017-01-09 DIAGNOSIS — Z79899 Other long term (current) drug therapy: Secondary | ICD-10-CM | POA: Diagnosis not present

## 2017-01-09 DIAGNOSIS — Z86018 Personal history of other benign neoplasm: Secondary | ICD-10-CM | POA: Diagnosis not present

## 2017-01-09 DIAGNOSIS — D649 Anemia, unspecified: Secondary | ICD-10-CM

## 2017-01-09 DIAGNOSIS — Z87891 Personal history of nicotine dependence: Secondary | ICD-10-CM | POA: Diagnosis not present

## 2017-01-09 DIAGNOSIS — Z91018 Allergy to other foods: Secondary | ICD-10-CM | POA: Diagnosis not present

## 2017-01-09 DIAGNOSIS — I340 Nonrheumatic mitral (valve) insufficiency: Secondary | ICD-10-CM | POA: Diagnosis not present

## 2017-01-09 DIAGNOSIS — J302 Other seasonal allergic rhinitis: Secondary | ICD-10-CM | POA: Diagnosis not present

## 2017-01-09 DIAGNOSIS — R000 Tachycardia, unspecified: Secondary | ICD-10-CM | POA: Diagnosis not present

## 2017-01-09 DIAGNOSIS — Z6837 Body mass index (BMI) 37.0-37.9, adult: Secondary | ICD-10-CM | POA: Diagnosis not present

## 2017-01-09 LAB — BASIC METABOLIC PANEL
ANION GAP: 9 (ref 5–15)
BUN: 7 mg/dL (ref 6–20)
CALCIUM: 8.4 mg/dL — AB (ref 8.9–10.3)
CO2: 25 mmol/L (ref 22–32)
CREATININE: 0.54 mg/dL (ref 0.44–1.00)
Chloride: 103 mmol/L (ref 101–111)
GFR calc Af Amer: >60 mL/min (ref >60)
GLUCOSE: 106 mg/dL — AB (ref 65–99)
Potassium: 3.3 mmol/L — ABNORMAL LOW (ref 3.5–5.1)
Sodium: 137 mmol/L (ref 135–145)

## 2017-01-09 LAB — CBC
HEMATOCRIT: 27 % — AB (ref 36.0–46.0)
Hemoglobin: 8.1 g/dL — ABNORMAL LOW (ref 12.0–15.0)
MCH: 24 pg — ABNORMAL LOW (ref 26.0–34.0)
MCHC: 30 g/dL (ref 30.0–36.0)
MCV: 80.1 fL (ref 78.0–100.0)
Platelets: 533 10*3/uL — ABNORMAL HIGH (ref 150–400)
RBC: 3.37 MIL/uL — AB (ref 3.87–5.11)
RDW: 16.8 % — ABNORMAL HIGH (ref 11.5–15.5)
WBC: 12 10*3/uL — AB (ref 4.0–10.5)

## 2017-01-09 LAB — PROTIME-INR
INR: 1.62
Prothrombin Time: 19.1 seconds — ABNORMAL HIGH (ref 11.4–15.2)

## 2017-01-09 LAB — MRSA PCR SCREENING: MRSA BY PCR: NEGATIVE

## 2017-01-09 MED ORDER — SODIUM CHLORIDE 0.9 % IV SOLN: 250.0000 mL | INTRAVENOUS | Status: DC

## 2017-01-09 MED ORDER — SODIUM CHLORIDE 0.9% FLUSH
3.0000 mL | Freq: Two times a day (BID) | INTRAVENOUS | Status: DC
  Administered 2017-01-10 (×2): 3 mL via INTRAVENOUS

## 2017-01-09 MED ORDER — SODIUM CHLORIDE 0.9 % IV SOLN
INTRAVENOUS | Status: DC
  Administered 2017-01-09: 12:00:00 via INTRAVENOUS

## 2017-01-09 MED ORDER — RIVAROXABAN 20 MG PO TABS
20.0000 mg | ORAL_TABLET | Freq: Every day | ORAL | Status: DC
  Administered 2017-01-10 – 2017-01-11 (×2): 20 mg via ORAL
  Filled 2017-01-09 (×2): qty 1

## 2017-01-09 MED ORDER — HYDROCODONE-ACETAMINOPHEN 5-325 MG PO TABS
1.0000 | ORAL_TABLET | ORAL | Status: DC | PRN
  Administered 2017-01-09 – 2017-01-10 (×3): 1 via ORAL
  Filled 2017-01-09 (×3): qty 1

## 2017-01-09 MED ORDER — POTASSIUM CHLORIDE CRYS ER 20 MEQ PO TBCR
20.0000 meq | EXTENDED_RELEASE_TABLET | Freq: Once | ORAL | Status: AC
  Administered 2017-01-09: 20 meq via ORAL
  Filled 2017-01-09: qty 1

## 2017-01-09 MED ORDER — SODIUM CHLORIDE 0.9% FLUSH: 3.0000 mL | INTRAVENOUS | Status: DC | PRN

## 2017-01-09 MED ORDER — ACETAMINOPHEN 500 MG PO TABS
1000.0000 mg | ORAL_TABLET | Freq: Three times a day (TID) | ORAL | Status: DC | PRN
  Filled 2017-01-09: qty 2

## 2017-01-09 MED ORDER — RIVAROXABAN 20 MG PO TABS
20.0000 mg | ORAL_TABLET | Freq: Every day | ORAL | Status: AC
  Administered 2017-01-09: 20 mg via ORAL
  Filled 2017-01-09: qty 1

## 2017-01-09 MED ORDER — TRAMADOL HCL 50 MG PO TABS
50.0000 mg | ORAL_TABLET | Freq: Four times a day (QID) | ORAL | Status: DC | PRN
  Administered 2017-01-09 (×2): 50 mg via ORAL
  Filled 2017-01-09 (×2): qty 1

## 2017-01-09 MED ORDER — MORPHINE SULFATE (PF) 4 MG/ML IV SOLN: 4.0000 mg | INTRAVENOUS | Status: DC | PRN

## 2017-01-09 NOTE — Progress Notes (Signed)
Text paged [facility] regarding pain in left shoulder blade, just a nagging pain that she has had since her heart surgery. She stated that they gave her Tramadol 50 mg after her surgery and I will give it to her again when ordered.

## 2017-01-09 NOTE — Progress Notes (Signed)
Progress Note  Patient Name: Melissa [name] Date of Encounter: 01/09/2017  Primary Cardiologist: Dr, Harrell Gave End  Subjective   Feels less short of breath and sense of palpitations today at rest. No chest pain. Right upper back discomfort intermittently since recent surgery.  Inpatient Medications    Scheduled Meds: . aspirin EC  81 mg Oral Daily  . fluticasone  1 spray Each Nare Daily  . rivaroxaban  20 mg Oral Q supper  . sodium chloride flush  3 mL Intravenous Q12H   Continuous Infusions: . sodium chloride    . diltiazem (CARDIZEM) infusion 12.5 mg/hr (01/09/17 0600)   PRN Meds: sodium chloride, acetaminophen, loratadine, pramipexole, sodium chloride flush, traMADol   Vital Signs    Vitals:   01/09/17 0603 01/09/17 0605 01/09/17 0654 01/09/17 0738  BP: 105/70     Pulse:      Resp:    19  Temp: 98.1 F (36.7 C)     TempSrc: Oral     SpO2: 99%  96% 96%  Weight:  226 lb 11.2 oz (102.8 kg)    Height:  5\' 6"  (1.676 m)      Intake/Output Summary (Last 24 hours) at 01/09/17 0851 Last data filed at 01/09/17 0700  Gross per 24 hour  Intake            382.5 ml  Output             1100 ml  Net           -717.5 ml   Filed Weights   01/08/17 2150 01/09/17 0605  Weight: 228 lb 3.2 oz (103.5 kg) 226 lb 11.2 oz (102.8 kg)    Telemetry    Atrial fibrillation. Personally reviewed.  ECG    Tracing from 01/08/2017 shows rapid atrial fibrillation with nonspecific ST changes. Personally reviewed.  Physical Exam   GEN: Obese woman. No acute distress.   Neck: No JVD. Cardiac: Irregularly irregular, no murmur or gallop.  Respiratory: Nonlabored. Clear to auscultation bilaterally. Thorax: Well-healing sternal incision. GI: Soft, nontender, bowel sounds present. MS: No edema; No deformity. Neuro:  Nonfocal. Psych: Alert and oriented x 3. Normal affect.  Labs    Chemistry Recent Labs Lab 01/08/17 1741 01/09/17 0609  NA 136 137  K 3.4* 3.3*  CL 102 103    CO2 25 25  GLUCOSE 108* 106*  BUN 8 7  CREATININE 0.52 0.54  CALCIUM 8.2* 8.4*  PROT 5.7*  --   ALBUMIN 2.7*  --   AST 29  --   ALT 30  --   ALKPHOS 88  --   BILITOT 0.4  --   GFRNONAA >60 >60  GFRAA >60 >60  ANIONGAP 9 9     Hematology Recent Labs Lab 01/08/17 1741 01/09/17 0609  WBC 12.9* 12.0*  RBC 3.24* 3.37*  HGB 8.0* 8.1*  HCT 25.8* 27.0*  MCV 79.6 80.1  MCH 24.7* 24.0*  MCHC 31.0 30.0  RDW 16.5* 16.8*  PLT 547* 533*    BNP Recent Labs Lab 01/08/17 1741  BNP 383.4*     Radiology    Dg Chest Port 1 View  Result Date: 01/08/2017 CLINICAL DATA:  Palpitations. Tachycardia. Shortness of breath. Atrial fibrillation. EXAM: PORTABLE CHEST 1 VIEW COMPARISON:  01/01/2017 FINDINGS: Stable mild cardiomegaly.  Previous median sternotomy. Mild bibasilar atelectasis and tiny bilateral pleural effusions show no significant change. No evidence of pulmonary consolidation or edema. No pneumothorax visualized. IMPRESSION: No significant change in mild  bibasilar atelectasis and tiny bilateral pleural effusions. Electronically Signed   By: Earle Gell M.D.   On: 01/08/2017 17:31    Cardiac Studies   Intraoperative TEE 12/28/2016:  Aortic valve: The valve is trileaflet. Mild valve thickening present. No stenosis. No regurgitation.  Tricuspid valve: There was mild to moderate tricuspid regurgitaion with a central jet. The tricuspid leaflets appeared structurally normal. The tricuspid annulus measured 3.81 cm in the 4 chamber view. There was normal forward flow in both systole and diastole in the hepatic veins. Post bypass exam: The tricuspid regurgitation appeared unchanged from the pre-bypass study.  Left ventricle: Normal cavity size and wall thickness. LV systolic function is normal with an EF of 60-65%. There are no obvious wall motion abnormalities.  Right ventricle: The RV cavity was increased in size. There was normal appearing RV systolic function. Following separation  from cardiopulmonary bypass., the RV cavity remained enlarged and there was normal RV systolic function.  Mitral valve: The mitral valve leaflets were mildly thickened. The left atrial mass impeded mitral inflow. The mass primarily contacted the anterior leaflet during diastole. Mean trans-mitral gradient was 6 mm hg by continuous wave Doppler. There was no mitral insufficiency. There were no prolapsing or flail mitral leaflet segments. Following separation from cardiopulmonary bypass, there was trace mitral insufficiency originating at the posterior-medial commissure. Coaptation appeared norma  Right atrium: Cavity is mildly to moderately dilated.  Patient Profile     51 y.o. 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  Assessment & Plan    1. New-onset atrial fibrillation with RVR, symptom onset within the last 24 hours. CHADSVASC score is 1-2 (minor coronary plaquing by cardiac catheterization). Heart rate is better on intravenous diltiazem, low-normal blood pressure limiting further up titration. Xarelto was started yesterday evening.  2. Postoperative anemia, hemoglobin stable at 8.1 (was 7.9 at hospital discharge on October 12).  3. Severe pulmonary hypertension by preoperative testing in the setting of mitral valve obstruction due to left atrial myxoma. RV reported to be enlarged by intraoperative TEE but with normal contraction.  Patient has received one dose of Xarelto 20 mg yesterday and remains on IV diltiazem for heart rate control. Will give another dose today and then tomorrow morning with plan for elective cardioversion tomorrow afternoon, presuming she does not spontaneously convert to sinus rhythm with heart rate control. Would stop aspirin for now. Obtain a follow-up transthoracic echocardiogram after she is back in sinus rhythm.  This could be an episode of isolated postoperative atrial fibrillation, but would plan to continue anticoagulation over the next several weeks to follow-up for potential PAF that may require antiarrhythmic therapy and longer term anticoagulation.  Signed, Rozann Lesches, MD  01/09/2017, 8:51 AM

## 2017-01-09 NOTE — Plan of Care (Signed)
Problem: Safety: Goal: Ability to remain free from injury will improve Outcome: Progressing Pt instructed to call for assistance and was shown how to use the call light and phone with numbers on the white board for RN/NT and she verbalized understanding, will continue to monitor.

## 2017-01-09 NOTE — Plan of Care (Signed)
Problem: Education: Goal: Knowledge of disease or condition will improve Outcome: Progressing Patient and husband given written instructions regarding A-Fib and explained what it was, answered questions, also gave information and explained Xarelto which is a new med given for anticoagulation, will continue to monitor need for more teaching.

## 2017-01-10 ENCOUNTER — Encounter (HOSPITAL_COMMUNITY)
Admission: EM | Disposition: A | Discharge status: Home / Self Care | Payer: Self-pay | Source: Home / Self Care | Attending: Internal Medicine

## 2017-01-10 ENCOUNTER — Inpatient Hospital Stay (HOSPITAL_COMMUNITY): Payer: 59 | Admitting: Certified Registered Nurse Anesthetist

## 2017-01-10 ENCOUNTER — Inpatient Hospital Stay (HOSPITAL_COMMUNITY): Payer: 59

## 2017-01-10 ENCOUNTER — Encounter (HOSPITAL_COMMUNITY): Payer: Self-pay | Admitting: *Deleted

## 2017-01-10 DIAGNOSIS — I340 Nonrheumatic mitral (valve) insufficiency: Secondary | ICD-10-CM

## 2017-01-10 DIAGNOSIS — I4891 Unspecified atrial fibrillation: Secondary | ICD-10-CM

## 2017-01-10 HISTORY — PX: CARDIOVERSION: SHX1299

## 2017-01-10 HISTORY — PX: TEE WITHOUT CARDIOVERSION: SHX5443

## 2017-01-10 LAB — CBC
HEMATOCRIT: 28.5 % — AB (ref 36.0–46.0)
Hemoglobin: 8.6 g/dL — ABNORMAL LOW (ref 12.0–15.0)
MCH: 24.2 pg — ABNORMAL LOW (ref 26.0–34.0)
MCHC: 30.2 g/dL (ref 30.0–36.0)
MCV: 80.3 fL (ref 78.0–100.0)
PLATELETS: 580 10*3/uL — AB (ref 150–400)
RBC: 3.55 MIL/uL — ABNORMAL LOW (ref 3.87–5.11)
RDW: 16.9 % — AB (ref 11.5–15.5)
WBC: 12.1 10*3/uL — AB (ref 4.0–10.5)

## 2017-01-10 LAB — BASIC METABOLIC PANEL
ANION GAP: 11 (ref 5–15)
BUN: 9 mg/dL (ref 6–20)
CHLORIDE: 103 mmol/L (ref 101–111)
CO2: 22 mmol/L (ref 22–32)
CREATININE: 0.52 mg/dL (ref 0.44–1.00)
Calcium: 8.2 mg/dL — ABNORMAL LOW (ref 8.9–10.3)
GFR calc non Af Amer: >60 mL/min (ref >60)
Glucose, Bld: 92 mg/dL (ref 65–99)
POTASSIUM: 3.5 mmol/L (ref 3.5–5.1)
SODIUM: 136 mmol/L (ref 135–145)

## 2017-01-10 LAB — PROTIME-INR
INR: 1.56
PROTHROMBIN TIME: 18.5 s — AB (ref 11.4–15.2)

## 2017-01-10 SURGERY — ECHOCARDIOGRAM, TRANSESOPHAGEAL
Anesthesia: General

## 2017-01-10 MED ORDER — FUROSEMIDE 40 MG PO TABS
40.0000 mg | ORAL_TABLET | Freq: Every day | ORAL | Status: DC
  Administered 2017-01-10 – 2017-01-11 (×2): 40 mg via ORAL
  Filled 2017-01-10 (×2): qty 1

## 2017-01-10 MED ORDER — DILTIAZEM HCL ER COATED BEADS 180 MG PO CP24
180.0000 mg | ORAL_CAPSULE | Freq: Every day | ORAL | Status: DC
Start: 2017-01-10 — End: 2017-01-10

## 2017-01-10 MED ORDER — DILTIAZEM HCL ER COATED BEADS 180 MG PO CP24
180.0000 mg | ORAL_CAPSULE | Freq: Every day | ORAL | Status: DC
  Administered 2017-01-10 – 2017-01-11 (×2): 180 mg via ORAL
  Filled 2017-01-10 (×2): qty 1

## 2017-01-10 MED ORDER — ONDANSETRON HCL 4 MG/2ML IJ SOLN: 4.0000 mg | Freq: Once | INTRAMUSCULAR | Status: DC | PRN

## 2017-01-10 MED ORDER — LIDOCAINE 2% (20 MG/ML) 5 ML SYRINGE
INTRAMUSCULAR | Status: DC | PRN
  Administered 2017-01-10: 40 mg via INTRAVENOUS

## 2017-01-10 MED ORDER — PROPOFOL 500 MG/50ML IV EMUL
INTRAVENOUS | Status: DC | PRN
  Administered 2017-01-10: 100 ug/kg/min via INTRAVENOUS

## 2017-01-10 MED ORDER — FENTANYL CITRATE (PF) 100 MCG/2ML IJ SOLN: 25.0000 ug | INTRAMUSCULAR | Status: DC | PRN

## 2017-01-10 MED ORDER — PROPOFOL 10 MG/ML IV BOLUS
INTRAVENOUS | Status: DC | PRN
  Administered 2017-01-10: 40 mg via INTRAVENOUS
  Administered 2017-01-10: 20 mg via INTRAVENOUS

## 2017-01-10 NOTE — Interval H&P Note (Signed)
History and Physical Interval Note:  01/10/2017 11:31 AM  Melissa [name]  has presented today for surgery, with the diagnosis of a fib  The various methods of treatment have been discussed with the patient and family. After consideration of risks, benefits and other options for treatment, the patient has consented to  Procedure(s): TRANSESOPHAGEAL ECHOCARDIOGRAM (TEE) (N/A) CARDIOVERSION (N/A) as a surgical intervention .  The patient's history has been reviewed, patient examined, no change in status, stable for surgery.  I have reviewed the patient's chart and labs.  Questions were answered to the patient's satisfaction.     [name]

## 2017-01-10 NOTE — Anesthesia Procedure Notes (Signed)
Procedure Name: MAC Performed by: Valda Favia Pre-anesthesia Checklist: Patient identified, Emergency Drugs available, Suction available, Patient being monitored and Timeout performed Patient Re-evaluated:Patient Re-evaluated prior to induction Oxygen Delivery Method: Nasal cannula Preoxygenation: Pre-oxygenation with 100% oxygen Induction Type: IV induction Placement Confirmation: positive ETCO2 Dental Injury: Teeth and Oropharynx as per pre-operative assessment

## 2017-01-10 NOTE — Plan of Care (Signed)
Problem: Physical Regulation: Goal: Ability to maintain clinical measurements within normal limits will improve Outcome: Progressing Cardizem remains at 15 cc/hr, in Afib HR ranging 80-110's through the night depending on activity. 2+ Edema to lower extremities. 3rd dose of Xarelto to be given this morning before cardioversion this afternoon.

## 2017-01-10 NOTE — Transfer of Care (Signed)
Immediate Anesthesia Transfer of Care Note  Patient: Melissa [name]  Procedure(s) Performed: TRANSESOPHAGEAL ECHOCARDIOGRAM (TEE) (N/A ) CARDIOVERSION (N/A )  Patient Location: Endoscopy Unit  Anesthesia Type:General  Level of Consciousness: awake, alert  and oriented  Airway & Oxygen Therapy: Patient Spontanous Breathing  Post-op Assessment: Report given to RN and Post -op Vital signs reviewed and stable  Post vital signs: Reviewed and stable  Last Vitals:  Vitals:   01/10/17 1215 01/10/17 1407  BP: 125/70   Pulse: (!) 103 77  Resp: 18 (!) 24  Temp: 36.9 C   SpO2: 100% 96%    Last Pain:  Vitals:   01/10/17 1407  TempSrc: Oral  PainSc:       Patients Stated Pain Goal: 0 (58/72/76 1848)  Complications: No apparent anesthesia complications

## 2017-01-10 NOTE — H&P (View-Only) (Signed)
Progress Note  Patient Name: Melissa [name] Date of Encounter: 01/09/2017  Primary Cardiologist: Dr, Harrell Gave End  Subjective   Feels less short of breath and sense of palpitations today at rest. No chest pain. Right upper back discomfort intermittently since recent surgery.  Inpatient Medications    Scheduled Meds: . aspirin EC  81 mg Oral Daily  . fluticasone  1 spray Each Nare Daily  . rivaroxaban  20 mg Oral Q supper  . sodium chloride flush  3 mL Intravenous Q12H   Continuous Infusions: . sodium chloride    . diltiazem (CARDIZEM) infusion 12.5 mg/hr (01/09/17 0600)   PRN Meds: sodium chloride, acetaminophen, loratadine, pramipexole, sodium chloride flush, traMADol   Vital Signs    Vitals:   01/09/17 0603 01/09/17 0605 01/09/17 0654 01/09/17 0738  BP: 105/70     Pulse:      Resp:    19  Temp: 98.1 F (36.7 C)     TempSrc: Oral     SpO2: 99%  96% 96%  Weight:  226 lb 11.2 oz (102.8 kg)    Height:  5\' 6"  (1.676 m)      Intake/Output Summary (Last 24 hours) at 01/09/17 0851 Last data filed at 01/09/17 0700  Gross per 24 hour  Intake            382.5 ml  Output             1100 ml  Net           -717.5 ml   Filed Weights   01/08/17 2150 01/09/17 0605  Weight: 228 lb 3.2 oz (103.5 kg) 226 lb 11.2 oz (102.8 kg)    Telemetry    Atrial fibrillation. Personally reviewed.  ECG    Tracing from 01/08/2017 shows rapid atrial fibrillation with nonspecific ST changes. Personally reviewed.  Physical Exam   GEN: Obese woman. No acute distress.   Neck: No JVD. Cardiac: Irregularly irregular, no murmur or gallop.  Respiratory: Nonlabored. Clear to auscultation bilaterally. Thorax: Well-healing sternal incision. GI: Soft, nontender, bowel sounds present. MS: No edema; No deformity. Neuro:  Nonfocal. Psych: Alert and oriented x 3. Normal affect.  Labs    Chemistry Recent Labs Lab 01/08/17 1741 01/09/17 0609  NA 136 137  K 3.4* 3.3*  CL 102 103    CO2 25 25  GLUCOSE 108* 106*  BUN 8 7  CREATININE 0.52 0.54  CALCIUM 8.2* 8.4*  PROT 5.7*  --   ALBUMIN 2.7*  --   AST 29  --   ALT 30  --   ALKPHOS 88  --   BILITOT 0.4  --   GFRNONAA >60 >60  GFRAA >60 >60  ANIONGAP 9 9     Hematology Recent Labs Lab 01/08/17 1741 01/09/17 0609  WBC 12.9* 12.0*  RBC 3.24* 3.37*  HGB 8.0* 8.1*  HCT 25.8* 27.0*  MCV 79.6 80.1  MCH 24.7* 24.0*  MCHC 31.0 30.0  RDW 16.5* 16.8*  PLT 547* 533*    BNP Recent Labs Lab 01/08/17 1741  BNP 383.4*     Radiology    Dg Chest Port 1 View  Result Date: 01/08/2017 CLINICAL DATA:  Palpitations. Tachycardia. Shortness of breath. Atrial fibrillation. EXAM: PORTABLE CHEST 1 VIEW COMPARISON:  01/01/2017 FINDINGS: Stable mild cardiomegaly.  Previous median sternotomy. Mild bibasilar atelectasis and tiny bilateral pleural effusions show no significant change. No evidence of pulmonary consolidation or edema. No pneumothorax visualized. IMPRESSION: No significant change in mild  bibasilar atelectasis and tiny bilateral pleural effusions. Electronically Signed   By: Earle Gell M.D.   On: 01/08/2017 17:31    Cardiac Studies   Intraoperative TEE 12/28/2016:  Aortic valve: The valve is trileaflet. Mild valve thickening present. No stenosis. No regurgitation.  Tricuspid valve: There was mild to moderate tricuspid regurgitaion with a central jet. The tricuspid leaflets appeared structurally normal. The tricuspid annulus measured 3.81 cm in the 4 chamber view. There was normal forward flow in both systole and diastole in the hepatic veins. Post bypass exam: The tricuspid regurgitation appeared unchanged from the pre-bypass study.  Left ventricle: Normal cavity size and wall thickness. LV systolic function is normal with an EF of 60-65%. There are no obvious wall motion abnormalities.  Right ventricle: The RV cavity was increased in size. There was normal appearing RV systolic function. Following separation  from cardiopulmonary bypass., the RV cavity remained enlarged and there was normal RV systolic function.  Mitral valve: The mitral valve leaflets were mildly thickened. The left atrial mass impeded mitral inflow. The mass primarily contacted the anterior leaflet during diastole. Mean trans-mitral gradient was 6 mm hg by continuous wave Doppler. There was no mitral insufficiency. There were no prolapsing or flail mitral leaflet segments. Following separation from cardiopulmonary bypass, there was trace mitral insufficiency originating at the posterior-medial commissure. Coaptation appeared norma  Right atrium: Cavity is mildly to moderately dilated.  Patient Profile     51 y.o. 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  Assessment & Plan    1. New-onset atrial fibrillation with RVR, symptom onset within the last 24 hours. CHADSVASC score is 1-2 (minor coronary plaquing by cardiac catheterization). Heart rate is better on intravenous diltiazem, low-normal blood pressure limiting further up titration. Xarelto was started yesterday evening.  2. Postoperative anemia, hemoglobin stable at 8.1 (was 7.9 at hospital discharge on October 12).  3. Severe pulmonary hypertension by preoperative testing in the setting of mitral valve obstruction due to left atrial myxoma. RV reported to be enlarged by intraoperative TEE but with normal contraction.  Patient has received one dose of Xarelto 20 mg yesterday and remains on IV diltiazem for heart rate control. Will give another dose today and then tomorrow morning with plan for elective cardioversion tomorrow afternoon, presuming she does not spontaneously convert to sinus rhythm with heart rate control. Would stop aspirin for now. Obtain a follow-up transthoracic echocardiogram after she is back in sinus rhythm.  This could be an episode of isolated postoperative atrial fibrillation, but would plan to continue anticoagulation over the next several weeks to follow-up for potential PAF that may require antiarrhythmic therapy and longer term anticoagulation.  Signed, Rozann Lesches, MD  01/09/2017, 8:51 AM

## 2017-01-10 NOTE — Anesthesia Postprocedure Evaluation (Signed)
Anesthesia Post Note  Patient: Melissa [name]  Procedure(s) Performed: TRANSESOPHAGEAL ECHOCARDIOGRAM (TEE) (N/A ) CARDIOVERSION (N/A )     Patient location during evaluation: PACU Anesthesia Type: General Level of consciousness: sedated and patient cooperative Pain management: pain level controlled Vital Signs Assessment: post-procedure vital signs reviewed and stable Respiratory status: spontaneous breathing Cardiovascular status: stable Anesthetic complications: no    Last Vitals:  Vitals:   01/10/17 1420 01/10/17 1430  BP: 105/61 108/60  Pulse: 77 84  Resp: 13 (!) 24  Temp:    SpO2: 100% 91%    Last Pain:  Vitals:   01/10/17 1407  TempSrc: Oral  PainSc:                  Nolon Nations

## 2017-01-10 NOTE — Anesthesia Preprocedure Evaluation (Addendum)
Anesthesia Evaluation  Patient identified by MRN, date of birth, ID band Patient awake    Reviewed: Allergy & Precautions, NPO status , Patient's Chart, lab work & pertinent test results  Airway Mallampati: II  TM Distance: >3 FB Neck ROM: Full    Dental  (+) Teeth Intact, Dental Advisory Given   Pulmonary asthma , former smoker,    breath sounds clear to auscultation       Cardiovascular negative cardio ROS   Rhythm:Irregular Rate:Tachycardia     Neuro/Psych  Headaches, PSYCHIATRIC DISORDERS Depression  Neuromuscular disease    GI/Hepatic negative GI ROS, Neg liver ROS,   Endo/Other  negative endocrine ROS  Renal/GU negative Renal ROS     Musculoskeletal  (+) Arthritis , Fibromyalgia -  Abdominal   Peds  Hematology negative hematology ROS (+) anemia ,   Anesthesia Other Findings   Reproductive/Obstetrics negative OB ROS                                                             Anesthesia Evaluation  Patient identified by MRN, date of birth, ID band Patient awake    Reviewed: Allergy & Precautions, NPO status , Patient's Chart, lab work & pertinent test results  Airway Mallampati: II  TM Distance: >3 FB Neck ROM: Full    Dental  (+) Teeth Intact, Dental Advisory Given   Pulmonary former smoker,    breath sounds clear to auscultation       Cardiovascular  Rhythm:Irregular Rate:Tachycardia     Neuro/Psych    GI/Hepatic   Endo/Other    Renal/GU      Musculoskeletal   Abdominal   Peds  Hematology   Anesthesia Other Findings   Reproductive/Obstetrics                             Anesthesia Physical Anesthesia Plan  ASA: III  Anesthesia Plan: General   Post-op Pain Management:    Induction: Intravenous  PONV Risk Score and Plan: 1 and Ondansetron and Dexamethasone  Airway Management Planned: Natural Airway, Mask  and Simple Face Mask  Additional Equipment:   Intra-op Plan:   Post-operative Plan:   Informed Consent: I have reviewed the patients History and Physical, chart, labs and discussed the procedure including the risks, benefits and alternatives for the proposed anesthesia with the patient or authorized representative who has indicated his/her understanding and acceptance.   Dental advisory given  Plan Discussed with: CRNA and Anesthesiologist  Anesthesia Plan Comments:         Anesthesia Quick Evaluation                                   Anesthesia Evaluation  Patient identified by MRN, date of birth, ID band Patient awake    Reviewed: Allergy & Precautions, NPO status , Patient's Chart, lab work & pertinent test results  Airway Mallampati: II  TM Distance: >3 FB Neck ROM: Full    Dental  (+) Teeth Intact, Dental Advisory Given   Pulmonary former smoker,    breath sounds clear to auscultation       Cardiovascular  Rhythm:Regular Rate:Normal     Neuro/Psych  GI/Hepatic   Endo/Other    Renal/GU      Musculoskeletal   Abdominal   Peds  Hematology   Anesthesia Other Findings   Reproductive/Obstetrics                             Anesthesia Physical Anesthesia Plan  ASA: III  Anesthesia Plan: General   Post-op Pain Management:    Induction: Intravenous  PONV Risk Score and Plan: Ondansetron and Dexamethasone  Airway Management Planned: Oral ETT  Additional Equipment:   Intra-op Plan:   Post-operative Plan: Extubation in OR  Informed Consent: I have reviewed the patients History and Physical, chart, labs and discussed the procedure including the risks, benefits and alternatives for the proposed anesthesia with the patient or authorized representative who has indicated his/her understanding and acceptance.   Dental advisory given  Plan Discussed with: CRNA and Anesthesiologist  Anesthesia Plan  Comments:         Anesthesia Quick Evaluation  Anesthesia Physical Anesthesia Plan  ASA: III  Anesthesia Plan: General   Post-op Pain Management:    Induction: Intravenous  PONV Risk Score and Plan: 1 and Ondansetron and Dexamethasone  Airway Management Planned: Natural Airway, Mask and Simple Face Mask  Additional Equipment:   Intra-op Plan:   Post-operative Plan:   Informed Consent: I have reviewed the patients History and Physical, chart, labs and discussed the procedure including the risks, benefits and alternatives for the proposed anesthesia with the patient or authorized representative who has indicated his/her understanding and acceptance.   Dental advisory given  Plan Discussed with: CRNA  Anesthesia Plan Comments:        Anesthesia Quick Evaluation

## 2017-01-10 NOTE — CV Procedure (Signed)
     Transesophageal Echocardiogram Note  Melissa [name] [id number] 01-Nov-1965  Procedure: Transesophageal Echocardiogram Indications: atrial fibrillation  Procedure Details Consent: Obtained Time Out: Verified patient identification, verified procedure, site/side was marked, verified correct patient position, special equipment/implants available, Radiology Safety Procedures followed,  medications/allergies/relevent history reviewed, required imaging and test results available.  Performed  Medications: Anesthesia by anesthesia staff.  Successful cardioversion after a TEE. See TEE report for full dictation.   Complications: No apparent complications Patient did tolerate procedure well.  [name], MD, [facility] 01/10/2017, 2:14 PM       Cardioversion Note  Yuriana Gaal [id number] 06-Jul-1965  Procedure: DC Cardioversion Indications: atrial fibrillation  Procedure Details Consent: Obtained Time Out: Verified patient identification, verified procedure, site/side was marked, verified correct patient position, special equipment/implants available, Radiology Safety Procedures followed,  medications/allergies/relevent history reviewed, required imaging and test results available.  Performed  The patient has been on adequate anticoagulation.  The patient received IV propofol and lidocaine for sedation.  Synchronous cardioversion was performed at 120 joules.  The cardioversion was successful.  Complications: No apparent complications Patient did tolerate procedure well.   [name], MD, [facility] 01/10/2017, 2:14 PM

## 2017-01-10 NOTE — Progress Notes (Signed)
  Echocardiogram Echocardiogram Transesophageal has been performed.  Tresa Res 01/10/2017, 4:07 PM

## 2017-01-10 NOTE — Progress Notes (Signed)
Progress Note  Patient Name: Melissa [name] Date of Encounter: 01/10/2017  Primary Cardiologist: Dr. Harrell Gave End  Subjective   Pt is feeling a little better today. She is feeling anxious prior to her procedure, TEE/cardioversion and is up pacing. No chest pain, shortness of breath or orthopnea. She has mild ankle edema.   Inpatient Medications    Scheduled Meds: . fluticasone  1 spray Each Nare Daily  . rivaroxaban  20 mg Oral Daily  . sodium chloride flush  3 mL Intravenous Q12H   Continuous Infusions: . sodium chloride 20 mL/hr at 01/09/17 1850  . sodium chloride    . diltiazem (CARDIZEM) infusion 15 mg/hr (01/09/17 1543)   PRN Meds: acetaminophen, HYDROcodone-acetaminophen, loratadine, morphine injection, pramipexole, sodium chloride flush   Vital Signs    Vitals:   01/09/17 2024 01/10/17 0000 01/10/17 0407 01/10/17 0743  BP: 118/74 133/77 110/80 104/74  Pulse: (!) 102  (!) 107 94  Resp: 18  18 18   Temp: 98.2 F (36.8 C)  98 F (36.7 C) 98.2 F (36.8 C)  TempSrc: Oral  Oral Oral  SpO2: 99% 99% 98% 100%  Weight:   229 lb 12.8 oz (104.2 kg)   Height:        Intake/Output Summary (Last 24 hours) at 01/10/17 1142 Last data filed at 01/10/17 0850  Gross per 24 hour  Intake          1083.83 ml  Output                0 ml  Net          1083.83 ml   Filed Weights   01/08/17 2150 01/09/17 0605 01/10/17 0407  Weight: 228 lb 3.2 oz (103.5 kg) 226 lb 11.2 oz (102.8 kg) 229 lb 12.8 oz (104.2 kg)    Telemetry    afib with RVR 90's-120's - Personally Reviewed  ECG    Atrial fibrillation at 108 bpm. - Personally Reviewed  Physical Exam   GEN: No acute distress.   Neck: No JVD Cardiac: Irregularly irregular rhythm, tachy, no murmurs, rubs, or gallops.  Respiratory: Clear to auscultation bilaterally. GI: Soft, nontender, non-distended  MS: Puffy ankle edema; No deformity. Neuro:  Nonfocal  Psych: Normal affect   Labs    Chemistry Recent Labs Lab  01/08/17 1741 01/09/17 0609 01/10/17 0448  NA 136 137 136  K 3.4* 3.3* 3.5  CL 102 103 103  CO2 25 25 22   GLUCOSE 108* 106* 92  BUN 8 7 9   CREATININE 0.52 0.54 0.52  CALCIUM 8.2* 8.4* 8.2*  PROT 5.7*  --   --   ALBUMIN 2.7*  --   --   AST 29  --   --   ALT 30  --   --   ALKPHOS 88  --   --   BILITOT 0.4  --   --   GFRNONAA >60 >60 >60  GFRAA >60 >60 >60  ANIONGAP 9 9 11      Hematology Recent Labs Lab 01/08/17 1741 01/09/17 0609 01/10/17 0448  WBC 12.9* 12.0* 12.1*  RBC 3.24* 3.37* 3.55*  HGB 8.0* 8.1* 8.6*  HCT 25.8* 27.0* 28.5*  MCV 79.6 80.1 80.3  MCH 24.7* 24.0* 24.2*  MCHC 31.0 30.0 30.2  RDW 16.5* 16.8* 16.9*  PLT 547* 533* 580*    Cardiac EnzymesNo results for input(s): TROPONINI in the last 168 hours. No results for input(s): TROPIPOC in the last 168 hours.   BNP Recent  Labs Lab 01/08/17 1741  BNP 383.4*     DDimer No results for input(s): DDIMER in the last 168 hours.   Radiology    Dg Chest Port 1 View  Result Date: 01/08/2017 CLINICAL DATA:  Palpitations. Tachycardia. Shortness of breath. Atrial fibrillation. EXAM: PORTABLE CHEST 1 VIEW COMPARISON:  01/01/2017 FINDINGS: Stable mild cardiomegaly.  Previous median sternotomy. Mild bibasilar atelectasis and tiny bilateral pleural effusions show no significant change. No evidence of pulmonary consolidation or edema. No pneumothorax visualized. IMPRESSION: No significant change in mild bibasilar atelectasis and tiny bilateral pleural effusions. Electronically Signed   By: Earle Gell M.D.   On: 01/08/2017 17:31    Cardiac Studies   Intra-operative TEE 12/28/2016:   Aortic valve: The valve is trileaflet. Mild valve thickening present. No stenosis. No regurgitation.  Tricuspid valve: There was mild to moderate tricuspid regurgitaion with a central jet. The tricuspid leaflets appeared structurally normal. The tricuspid annulus measured 3.81 cm in the 4 chamber view. There was normal forward flow in  both systole and diastole in the hepatic veins. Post bypass exam: The tricuspid regurgitation appeared unchanged from the pre-bypass study.  Left ventricle: Normal cavity size and wall thickness. LV systolic function is normal with an EF of 60-65%. There are no obvious wall motion abnormalities.  Right ventricle: The RV cavity was increased in size. There was normal appearing RV systolic function. Following separation from cardiopulmonary bypass., the RV cavity remained enlarged and there was normal RV systolic function.  Mitral valve: The mitral valve leaflets were mildly thickened. The left atrial mass impeded mitral inflow. The mass primarily contacted the anterior leaflet during diastole. Mean trans-mitral gradient was 6 mm hg by continuous wave Doppler. There was no mitral insufficiency. There were no prolapsing or flail mitral leaflet segments. Following separation from cardiopulmonary bypass, there was trace mitral insufficiency originating at the posterior-medial commissure. Coaptation appeared norma  Right atrium: Cavity is mildly to moderately dilated.  Patient Profile     51 y.o. female history of left atrial myxoma status post resection on October 9, no significant CAD by preoperative cardiac catheterization, presenting now with new onset rapid atrial fibrillation.  Assessment & Plan    1. New onset atrial fibrillation with RVR: symptom onset within 24hrs of presentation on 10/20. CHA2DS2/VAS Stroke Risk Score 1-2 (minor coronary plaque by cath, female) . Pt started on Xarelto for stroke risk reduction. Placed on diltiazem drip, now at 15 mg/hr. Continues in afib with rates 90's-120's. BP stable. Is going for TEE/DCCV today. This could be an isolated episode of postop afib and hopefully will convert and maintain SR. Plan to continue anticoagulation for the next several weeks for potential PAF that may require antiarrhythmic therapy and longer term anticoagulation. If she converts to sinus  rhythm today can switch cardizem to oral dosing. Equivalent dosing would be 360 mg daily.   2. Postoperative anemia: Hgb 8.1on admision, 8.6 today, was 7.9 at hospital discharge on 10/12. Stable.    3. Severe pulmonary HTN by pre-op testing in setting of mitral valve obstruction by left atrial myxoma. RV and RA enlarged on intraoperative TEE. Plan to repeat TTE once back in sinus rhythm. Pt has ankle edema today, otherwise appears euvolemic. Will resume home oral lasix 40 mg daily after procedure today.    For questions or updates, please contact [facility] Please consult [email or web address] for contact info under Cardiology/STEMI.      Signed, Daune Perch, NP  01/10/2017, 11:42 AM

## 2017-01-11 ENCOUNTER — Encounter (HOSPITAL_COMMUNITY): Payer: Self-pay | Admitting: Cardiology

## 2017-01-11 MED ORDER — DILTIAZEM HCL ER COATED BEADS 180 MG PO CP24: 180.0000 mg | ORAL_CAPSULE | Freq: Every day | ORAL | 2 refills | Status: DC

## 2017-01-11 MED ORDER — RIVAROXABAN 20 MG PO TABS: 20.0000 mg | ORAL_TABLET | Freq: Every day | ORAL | 11 refills | Status: DC

## 2017-01-11 MED ORDER — RIVAROXABAN 20 MG PO TABS: 20.0000 mg | ORAL_TABLET | Freq: Every day | ORAL | 0 refills | Status: DC

## 2017-01-11 NOTE — Plan of Care (Signed)
Problem: Physical Regulation: Goal: Ability to maintain clinical measurements within normal limits will improve Outcome: Progressing Patient's IV cardizem D/C'd, transitioned to PO cardizem. Patient remained in SR throughout evening, stating she feels much better since the cardioversion.

## 2017-01-11 NOTE — Care Management Note (Signed)
Case Management Note  Patient Details  Name: Melissa [name] MRN: [id number] Date of Birth: 11/18/1965  Subjective/Objective:   Pt presented for Atrial Fib. PTA independent from home with spouse. Pt plans to d/c on Xarelto. 30 day free card/ co pay card delivered to patient.                   Action/Plan: CM did call [facility] and co pay is $35.00. Pt is aware of cost and medication is available. No further needs from CM at this time.   Expected Discharge Date:                  Expected Discharge Plan:  Home/Self Care  In-House Referral:  NA  Discharge planning Services  CM Consult  Post Acute Care Choice:  NA Choice offered to:  NA  DME Arranged:  N/A DME Agency:  NA  HH Arranged:  NA HH Agency:  NA  Status of Service:  Completed, signed off  If discussed at [place] of Stay Meetings, dates discussed:    Additional Comments:  Bethena Roys, RN 01/11/2017, 12:13 PM

## 2017-01-11 NOTE — Discharge Summary (Signed)
Discharge Summary    Patient ID: Melissa [name],  MRN: [id number], DOB/AGE: 11/25/1965 51 y.o.  Admit date: 01/08/2017 Discharge date: 01/11/2017  Primary Care Provider: Maurice Small Primary Cardiologist: [name]  Discharge Diagnoses    Active Problems:   RLS (restless legs syndrome)   Allergic rhinitis   Postoperative anemia   Atrial fibrillation with RVR (HCC)   History of atrial myxoma   Allergies Allergies  Allergen Reactions  . Other Anaphylaxis and Other (See Comments)    HONEYDEW MELON    Diagnostic Studies/Procedures    TEE results pending.  _____________   History of Present Illness     Melissa [name] is a 51 yo woman with PMH of left atrial myxoma s/p resection 12/28/16 who presents with new palpitations. In general, she had been feeling well until this afternoon, when she acutely developed palpitations and "thumping" in her chest. She has never had this sensation before. She was found to be in atrial fibrillation with RVR by EMS and was brought to [facility] ER for evaluation. In the ER, her heart rates were in the 130s-140s but BP stable at 131/74. Labs notable for K 3.4, Cr 0.52, BNP 383, WBC 12.9, Hgb 8.0 (was 7.9 at recent discharge), plt 547.   Hospital Course     Consultants: none  CHA2DS2/VAS Stroke Risk Score 1-2 (minor coronary plaque by cath, female) . Pt started on Xarelto for stroke risk reduction. Placed on diltiazem drip. DCCV as done on 01/10/17. She is now on oral diltiazem 180 mg daily with stable heart rate and BP.  This could be an isolated episode of postop afib and hopefully will maintain SR. Plan to continue anticoagulation for the next several weeks for potential PAF that may require antiarrhythmic therapy and longer term anticoagulation.   She has lower extremity edema. Melissa [name] lasix was held on admission.  She has lower extremity edema but no crackles, shortness of breath or orthopnea.  Increase home dose to 40mg  bid x3 days then  40mg  daily.  Encouraged compression stockings.  _____________  Discharge Vitals Blood pressure 130/70, pulse 95, temperature 97.6 F (36.4 C), temperature source Oral, resp. rate 18, height 5\' 6"  (1.676 m), weight 230 lb 1.6 oz (104.4 kg), SpO2 100 %.  Filed Weights   01/09/17 0605 01/10/17 0407 01/11/17 0420  Weight: 226 lb 11.2 oz (102.8 kg) 229 lb 12.8 oz (104.2 kg) 230 lb 1.6 oz (104.4 kg)    Labs & Radiologic Studies    CBC  Recent Labs  01/08/17 1741 01/09/17 0609 01/10/17 0448  WBC 12.9* 12.0* 12.1*  NEUTROABS 9.3*  --   --   HGB 8.0* 8.1* 8.6*  HCT 25.8* 27.0* 28.5*  MCV 79.6 80.1 80.3  PLT 547* 533* 580*   Basic Metabolic Panel  Recent Labs  01/08/17 1741 01/09/17 0609 01/10/17 0448  NA 136 137 136  K 3.4* 3.3* 3.5  CL 102 103 103  CO2 25 25 22   GLUCOSE 108* 106* 92  BUN 8 7 9   CREATININE 0.52 0.54 0.52  CALCIUM 8.2* 8.4* 8.2*  MG 2.1  --   --    Liver Function Tests  Recent Labs  01/08/17 1741  AST 29  ALT 30  ALKPHOS 88  BILITOT 0.4  PROT 5.7*  ALBUMIN 2.7*   No results for input(s): LIPASE, AMYLASE in the last 72 hours. Cardiac Enzymes No results for input(s): CKTOTAL, CKMB, CKMBINDEX, TROPONINI in the last 72 hours. BNP Invalid  input(s): POCBNP D-Dimer No results for input(s): DDIMER in the last 72 hours. Hemoglobin A1C No results for input(s): HGBA1C in the last 72 hours. Fasting Lipid Panel No results for input(s): CHOL, HDL, LDLCALC, TRIG, CHOLHDL, LDLDIRECT in the last 72 hours. Thyroid Function Tests No results for input(s): TSH, T4TOTAL, T3FREE, THYROIDAB in the last 72 hours.  Invalid input(s): FREET3 _____________  Dg Chest 2 View  Result Date: 01/01/2017 CLINICAL DATA:  Chest tube removal. EXAM: CHEST  2 VIEW COMPARISON:  Radiograph of December 31, 2016. FINDINGS: Stable cardiomediastinal silhouette. Sternotomy wires are noted. No pneumothorax is noted. Mild bibasilar subsegmental atelectasis is noted with minimal pleural  effusions. Bony thorax is unremarkable. IMPRESSION: Mild bibasilar subsegmental atelectasis with minimal pleural effusions. Electronically Signed   By: Marijo Conception, M.D.   On: 01/01/2017 07:59   Dg Chest 2 View  Result Date: 12/24/2016 CLINICAL DATA:  Mass in the atrium EXAM: CHEST  2 VIEW COMPARISON:  06/10/2015 FINDINGS: Small bilateral effusions. No focal consolidation. Mild cardiomegaly. Increased cardiac opacity with slight splaying of the carina. No pneumothorax IMPRESSION: 1. Small bilateral effusions 2. Mild cardiomegaly with left atrial enlargement. Electronically Signed   By: Donavan Foil M.D.   On: 12/24/2016 20:56   Ct Chest W Contrast  Result Date: 12/25/2016 CLINICAL DATA:  Atrial myxoma, dyspnea and asthma. EXAM: CT CHEST WITH CONTRAST TECHNIQUE: Multidetector CT imaging of the chest was performed during intravenous contrast administration. CONTRAST:  75 cc Isovue-300 IV COMPARISON:  None. FINDINGS: Cardiovascular: There is a soft tissue mass within the left atrium measuring 4 x 6.2 x 4.1 cm with its base along the interatrial septum and slightly prolapsing through mitral valve.Normal branch pattern of the great vessels. No aortic aneurysm or dissection. Preferential enhancement of the pulmonary arterial system. Dilatation of the main pulmonary artery consistent with a component of pulmonary hypertension is seen up to 3.5 cm in caliber. There has also dilatation of the right and left main pulmonary artery is, measuring 3.4 cm on the right and 2 cm on the left. Coronary arteriosclerosis is noted predominant involving the LAD. Mediastinum/Nodes: Tiny too small to further characterize hypodensities within the thyroid without thyromegaly. Lungs/Pleura: Scarring and/or atelectasis is seen in the lingula in each lung base. No pneumonic consolidation, effusion or pneumothorax. No dominant mass. Upper Abdomen: Status post bariatric surgery. No acute upper abdominal abnormality. Musculoskeletal:  Midthoracic kyphosis attributable to moderate disc space narrowing and endplate spurring. No acute nor suspicious osseous abnormality. IMPRESSION: 1. Intracavitary mass predominantly noted within the left atrium with slight protrusion through the mitral valve, possibly representing a ball valve effect, overall measuring 4 x 6.2 x 4.1 cm and consistent with reported history of atrial myxoma. 2. Dilatation of the main, right and left pulmonary arteries consistent with pulmonary hypertension. 3. Coronary arteriosclerosis. 4. Lingular bibasilar scarring and/or atelectasis. Electronically Signed   By: Ashley Royalty M.D.   On: 12/25/2016 19:46   Dg Chest Port 1 View  Result Date: 01/08/2017 CLINICAL DATA:  Palpitations. Tachycardia. Shortness of breath. Atrial fibrillation. EXAM: PORTABLE CHEST 1 VIEW COMPARISON:  01/01/2017 FINDINGS: Stable mild cardiomegaly.  Previous median sternotomy. Mild bibasilar atelectasis and tiny bilateral pleural effusions show no significant change. No evidence of pulmonary consolidation or edema. No pneumothorax visualized. IMPRESSION: No significant change in mild bibasilar atelectasis and tiny bilateral pleural effusions. Electronically Signed   By: Earle Gell M.D.   On: 01/08/2017 17:31   Dg Chest Port 1 View  Result Date: 12/31/2016  CLINICAL DATA:  Shortness of Breath EXAM: PORTABLE CHEST 1 VIEW COMPARISON:  12/30/2016 FINDINGS: Interval removal of right central line and right chest tube. No pneumothorax. Cardiomegaly. Bibasilar atelectasis, left greater than right, slightly improved since prior study with increasing lung volumes. No effusions. IMPRESSION: Interval removal of right chest tube without pneumothorax. Improving aeration with improving bibasilar atelectasis. Electronically Signed   By: Rolm Baptise M.D.   On: 12/31/2016 07:16   Dg Chest Port 1 View  Result Date: 12/30/2016 CLINICAL DATA:  51 year old female with history of atrial myxoma status post surgical  resection. EXAM: PORTABLE CHEST 1 VIEW COMPARISON:  Chest x-ray 12/29/2016. FINDINGS: There is a right-sided internal jugular central venous catheter with tip terminating in the superior cavoatrial junction. Right IJ Cordis also noted with tip terminating in the mid superior vena cava. Midline surgical drain, likely a pericardial or mediastinal drain. Right-sided chest tube with tip projecting over the mid right hemithorax. Lung volumes are low. Bibasilar opacities are most compatible with areas of postoperative subsegmental atelectasis, with superimposed small left pleural effusion. No appreciable pneumothorax. No evidence of pulmonary edema. Cardiopericardial silhouette appears mildly enlarged. Upper mediastinal contours are within normal limits. Status post median sternotomy. IMPRESSION: 1. Postoperative changes and support apparatus, as above. 2. Low lung volumes with bibasilar subsegmental atelectasis and small left pleural effusion. Electronically Signed   By: Vinnie Langton M.D.   On: 12/30/2016 07:47   Dg Chest Port 1 View  Result Date: 12/29/2016 CLINICAL DATA:  51 year old female status post surgical excision of atrial myxoma postop day 1. EXAM: PORTABLE CHEST 1 VIEW COMPARISON:  12/28/2016 and earlier. FINDINGS: Portable AP upright view at 0543 hours. Extubated and enteric tube removed. Right IJ approach Swan-Ganz catheter and separate right IJ central line appear stable. Swan-Ganz tip at the level of the right main pulmonary artery. Right chest tube remains in place. Mediastinal drain in place. Mildly improved lung volumes. Stable cardiac size and mediastinal contours. Regressed bilateral patchy perihilar opacity. No pneumothorax or pulmonary edema. No pleural effusion is evident. IMPRESSION: 1. Extubated and enteric tube removed. 2.  Otherwise stable lines and tubes. 3. Improved lung volumes and decreased atelectasis. No pneumothorax or pulmonary edema. Electronically Signed   By: Genevie Ann M.D.    On: 12/29/2016 08:11   Dg Chest Port 1 View  Result Date: 12/28/2016 CLINICAL DATA:  51 year old female status post cardiac surgery to remove an atrial myxoma EXAM: PORTABLE CHEST 1 VIEW COMPARISON:  Chest x-ray 12/24/2016 FINDINGS: The patient is intubated. The tip of the endotracheal tube is in good position 4.2 cm above the carina. A right IJ vascular sheath convey is a Swan-Ganz catheter into the heart. The tip of the Swan overlies the right main pulmonary artery. A right IJ central venous catheter is present. The catheter tip overlies the superior cavoatrial junction. A nasogastric tube is present. The tip lies below the diaphragm, presumably within the stomach. Cardiac and mediastinal contours are within normal limits. Inspiratory volumes are low. There is mild bibasilar atelectasis. No evidence of pulmonary edema. No acute osseous abnormality. IMPRESSION: 1. The tip of the endotracheal tube is 4.2 cm above the carina. 2. The tip of the Swan Ganz catheter overlies the right main pulmonary artery. 3. The tip of the right IJ central venous catheter overlies the superior cavoatrial junction. 4. The tip of the gastric tube is not visualized but lies below the diaphragm and presumably within the stomach. 5. Low inspiratory volumes with bibasilar atelectasis. Electronically Signed  By: Jacqulynn Cadet M.D.   On: 12/28/2016 14:13   Disposition   Pt is being discharged home today in good condition.  Follow-up Plans & Appointments    Follow-up Information    End, Harrell Gave, MD Follow up.   Specialty:  Cardiology Why:  On October 26th at 8:20 for [facility] hospital follow up.  Contact information: [address] [place] [address] [phone]          Discharge Instructions    Diet - low sodium heart healthy    Complete by:  As directed    Discharge instructions    Complete by:  As directed    Take lasix 40 mg twice a day for 3 days then back to 40 mg daily for leg  swelling.   Increase activity slowly    Complete by:  As directed       Discharge Medications   Current Discharge Medication List    START taking these medications   Details  diltiazem (CARDIZEM CD) 180 MG 24 hr capsule Take 1 capsule (180 mg total) by mouth daily. Qty: 90 capsule, Refills: 2    rivaroxaban (XARELTO) 20 MG TABS tablet Take 1 tablet (20 mg total) by mouth daily. Qty: 30 tablet, Refills: 11      CONTINUE these medications which have NOT CHANGED   Details  acetaminophen (TYLENOL) 500 MG tablet Take 2 tablets (1,000 mg total) by mouth every 8 (eight) hours as needed. Qty: 30 tablet, Refills: 0    albuterol (PROVENTIL HFA) 108 (90 Base) MCG/ACT inhaler Inhale 2 puffs into the lungs as needed.    cetirizine (ZYRTEC) 10 MG tablet Take 10 mg by mouth daily as needed for allergies.     Cholecalciferol (VITAMIN D) 2000 units CAPS Take 2,000 Units by mouth daily.    furosemide (LASIX) 40 MG tablet Take 1 tablet (40 mg total) by mouth daily. Qty: 14 tablet, Refills: 0   Associated Diagnoses: Edema of both lower extremities    hydrOXYzine (ATARAX/VISTARIL) 10 MG tablet Take 10 mg by mouth 3 (three) times daily as needed (migraines).    levalbuterol (XOPENEX) 1.25 MG/0.5ML nebulizer solution Take 1.25 mg by nebulization 2 (two) times daily. Qty: 1 each, Refills: 1    Mometasone Furoate (ASMANEX HFA) 200 MCG/ACT AERO Inhale 200 mcg into the lungs 2 (two) times daily.    Multiple Vitamin (MULTIVITAMIN) tablet Take 1 tablet by mouth 2 (two) times daily as needed.     Nebulizers (COMPRESSOR/NEBULIZER) MISC 1.25 mg by Does not apply route 2 (two) times daily. Qty: 1 each, Refills: 0    oxyCODONE (OXY IR/ROXICODONE) 5 MG immediate release tablet Take 1 tablet (5 mg total) by mouth every 4 (four) hours as needed for severe pain. Qty: 30 tablet, Refills: 0    Potassium Chloride ER 20 MEQ TBCR Take 20 mEq by mouth daily. Qty: 14 tablet, Refills: 0   Associated Diagnoses:  Edema of both lower extremities    pramipexole (MIRAPEX) 1 MG tablet Take 1 mg by mouth 2 (two) times daily as needed.    promethazine (PHENERGAN) 12.5 MG tablet Take 1-2 tablets by mouth 3 (three) times daily as needed for nausea/vomiting. Refills: 5    SUMAtriptan 6 MG/0.5ML SOAJ Inject one at onset of severe headache; may repeat once in 2 hours if HA persists    tiZANidine (ZANAFLEX) 2 MG tablet Take 2 mg by mouth every 6 (six) hours as needed (headache).  Refills: 5  traMADol (ULTRAM) 50 MG tablet Take 1 tablet (50 mg total) by mouth every 6 (six) hours as needed. Qty: 30 tablet, Refills: 0   Associated Diagnoses: Post-op pain      STOP taking these medications     aspirin 325 MG EC tablet      metoprolol tartrate (LOPRESSOR) 25 MG tablet            Outstanding Labs/Studies   TEE results pending.   Duration of Discharge Encounter   Greater than 30 minutes including physician time.  Signed, Daune Perch NP 01/11/2017, 1:14 PM

## 2017-01-11 NOTE — Progress Notes (Signed)
DC instructions given to patient and spouse. Questions answered, xarelto paperwork given as well as 30day paper. PIV DC, hemostasis achieved. VSS. CCMD notified of DC. All belongings sent home with patient at time of DC. Pt ambulatory escorted by NT to private vehicle driven by spouse.

## 2017-01-11 NOTE — Progress Notes (Signed)
Physician notified: Phylliss Bob At: 1233  Regarding: OK for DC per Dr. Oval Linsey, [facility] ready for DC on xarelto. Waiting for DC orders.   Orders: Will be first DC paperwork.

## 2017-01-11 NOTE — Progress Notes (Signed)
Progress Note  Patient Name: Melissa [name] Date of Encounter: 01/11/2017  Primary Cardiologist: Dr. Saunders [name]  Subjective   Feeling well and ready to go home.  Still has some LE edema but no shortness of breath or palpitations.   Inpatient Medications    Scheduled Meds: . diltiazem  180 mg Oral Daily  . fluticasone  1 spray Each Nare Daily  . furosemide  40 mg Oral Daily  . rivaroxaban  20 mg Oral Daily  . sodium chloride flush  3 mL Intravenous Q12H   Continuous Infusions: . sodium chloride     PRN Meds: acetaminophen, fentaNYL (SUBLIMAZE) injection, HYDROcodone-acetaminophen, loratadine, morphine injection, ondansetron (ZOFRAN) IV, pramipexole, sodium chloride flush   Vital Signs    Vitals:   01/10/17 2045 01/11/17 0032 01/11/17 0420 01/11/17 0910  BP: 125/69 116/69 123/69 130/70  Pulse: 93 84 89 95  Resp: 16 18 18    Temp: 98.1 F (36.7 C) 98.1 F (36.7 C) 98.5 F (36.9 C) 97.6 F (36.4 C)  TempSrc: Oral Oral Oral Oral  SpO2: 100% 100% 98% 100%  Weight:   104.4 kg (230 lb 1.6 oz)   Height:        Intake/Output Summary (Last 24 hours) at 01/11/17 1146 Last data filed at 01/10/17 2101  Gross per 24 hour  Intake           532.75 ml  Output                0 ml  Net           532.75 ml   Filed Weights   01/09/17 0605 01/10/17 0407 01/11/17 0420  Weight: 102.8 kg (226 lb 11.2 oz) 104.2 kg (229 lb 12.8 oz) 104.4 kg (230 lb 1.6 oz)    Telemetry    Atrial fibrillation then sinus rhythm.  - Personally Reviewed  ECG    01/10/17: Sinus rhythm.  Rate 78 bpm.- Personally Reviewed  Physical Exam   GEN: No acute distress.   Neck: No JVD Cardiac: RRR, no murmurs, rubs, or gallops.  Respiratory: Clear to auscultation bilaterally. GI: Soft, nontender, non-distended  MS: 1+ pitting edema to the knees bilaterally; No deformity. Neuro:  Nonfocal  Psych: Normal affect   Labs    Chemistry Recent Labs Lab 01/08/17 1741 01/09/17 0609 01/10/17 0448  NA 136  137 136  K 3.4* 3.3* 3.5  CL 102 103 103  CO2 25 25 22   GLUCOSE 108* 106* 92  BUN 8 7 9   CREATININE 0.52 0.54 0.52  CALCIUM 8.2* 8.4* 8.2*  PROT 5.7*  --   --   ALBUMIN 2.7*  --   --   AST 29  --   --   ALT 30  --   --   ALKPHOS 88  --   --   BILITOT 0.4  --   --   GFRNONAA >60 >60 >60  GFRAA >60 >60 >60  ANIONGAP 9 9 11      Hematology Recent Labs Lab 01/08/17 1741 01/09/17 0609 01/10/17 0448  WBC 12.9* 12.0* 12.1*  RBC 3.24* 3.37* 3.55*  HGB 8.0* 8.1* 8.6*  HCT 25.8* 27.0* 28.5*  MCV 79.6 80.1 80.3  MCH 24.7* 24.0* 24.2*  MCHC 31.0 30.0 30.2  RDW 16.5* 16.8* 16.9*  PLT 547* 533* 580*    Cardiac EnzymesNo results for input(s): TROPONINI in the last 168 hours. No results for input(s): TROPIPOC in the last 168 hours.   BNP Recent Labs Lab 01/08/17  1741  BNP 383.4*     DDimer No results for input(s): DDIMER in the last 168 hours.   Radiology    No results found.  Cardiac Studies   TEE 12/27/16:   Aortic valve: The valve is trileaflet. Mild valve thickening present. No stenosis. No regurgitation.  Tricuspid valve: There was mild to moderate tricuspid regurgitaion with a central jet. The tricuspid leaflets appeared structurally normal. The tricuspid annulus measured 3.81 cm in the 4 chamber view. There was normal forward flow in both systole and diastole in the hepatic veins. Post bypass exam: The tricuspid regurgitation appeared unchanged from the pre-bypass study.  Left ventricle: Normal cavity size and wall thickness. LV systolic function is normal with an EF of 60-65%. There are no obvious wall motion abnormalities.  Right ventricle: The RV cavity was increased in size. There was normal appearing RV systolic function. Following separation from cardiopulmonary bypass., the RV cavity remained enlarged and there was normal RV systolic function.  Mitral valve: The mitral valve leaflets were mildly thickened. The left atrial mass impeded mitral inflow. The mass  primarily contacted the anterior leaflet during diastole. Mean trans-mitral gradient was 6 mm hg by continuous wave Doppler. There was no mitral insufficiency. There were no prolapsing or flail mitral leaflet segments. Following separation from cardiopulmonary bypass, there was trace mitral insufficiency originating at the posterior-medial commissure. Coaptation appeared norma  Right atrium: Cavity is mildly to moderately dilated.   TEE 01/10/17: Results pending  Patient Profile     Ms. Melissa [name] is a 4F with asthma and recent excision of a large left atrial myoxma here with atrial fibrillation with RVR.  Assessment & Plan    # Atrial fibrillation with RVR: Ms. Melissa [name] is back in sinus rhythm after DCCV 10/22.  Heart rate and BP are stable on Diltiazem.  Continue Xarelto.  # LE edema: Ms. Melissa [name] lasix was held on admission.  She has lower extremity edema but no crackles, shortness of breath or orthopnea.  Increase home dose to 40mg  bid x3 days then 40mg  daily.  Encouraged compression stockings.   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   For questions or updates, please contact [facility] Please consult [email or web address] for contact info under Cardiology/STEMI.      Signed, Melissa Latch, MD  01/11/2017, 11:46 AM

## 2017-01-12 DIAGNOSIS — Z23 Encounter for immunization: Secondary | ICD-10-CM | POA: Diagnosis not present

## 2017-01-12 DIAGNOSIS — L03317 Cellulitis of buttock: Secondary | ICD-10-CM | POA: Diagnosis not present

## 2017-01-14 ENCOUNTER — Ambulatory Visit (INDEPENDENT_AMBULATORY_CARE_PROVIDER_SITE_OTHER): Payer: 59 | Admitting: Internal Medicine

## 2017-01-14 ENCOUNTER — Encounter: Payer: Self-pay | Admitting: Internal Medicine

## 2017-01-14 VITALS — BP 108/60 | HR 95 | Ht 66.0 in | Wt 225.0 lb

## 2017-01-14 DIAGNOSIS — I9789 Other postprocedural complications and disorders of the circulatory system, not elsewhere classified: Secondary | ICD-10-CM | POA: Diagnosis not present

## 2017-01-14 DIAGNOSIS — I4891 Unspecified atrial fibrillation: Secondary | ICD-10-CM | POA: Diagnosis not present

## 2017-01-14 DIAGNOSIS — D151 Benign neoplasm of heart: Secondary | ICD-10-CM

## 2017-01-14 DIAGNOSIS — I5030 Unspecified diastolic (congestive) heart failure: Secondary | ICD-10-CM

## 2017-01-14 DIAGNOSIS — I272 Pulmonary hypertension, unspecified: Secondary | ICD-10-CM | POA: Diagnosis not present

## 2017-01-14 MED ORDER — FUROSEMIDE 40 MG PO TABS: 40.0000 mg | ORAL_TABLET | Freq: Every day | ORAL | 11 refills | Status: DC

## 2017-01-14 NOTE — Patient Instructions (Addendum)
Your physician recommends that you continue on your current medications as directed. Please refer to the Current Medication list given to you today.  Your physician has requested that you have an echocardiogram. Echocardiography is a painless test that uses sound waves to create images of your heart. It provides your doctor with information about the size and shape of your heart and how well your heart's chambers and valves are working. This procedure takes approximately one hour. There are no restrictions for this procedure. WITH BUBBLE STUDY  IN 1 MONTH  Your physician recommends that you schedule a follow-up appointment in:  SHORTLY AFTER ECHO 1 MONTH WITH DR END

## 2017-01-14 NOTE — Progress Notes (Signed)
Follow-up Outpatient Visit Date: 01/14/2017  Primary Care Provider: Maurice Small, MD [address] [id number]  Chief Complaint: Follow-up atrial fibrillation status post recent atrial myxoma resection  HPI:  Melissa [name] is a 51 y.o. year-old female with history of left atrial myxoma status post resection complicated by postoperative atrial fibrillation, who presents for follow-up of atrial myxoma, atrial fibrillation, and heart failure with preserved ejection fraction.  I met Melissa [name] on 12/24/16 after echocardiogram ordered for assessment of shortness of breath revealed a large left atrial mass with near complete obstruction of the mitral valve in diastole.  Melissa [name] was admitted and underwent resection of the left atrial mass on 12/28/16.  She was discharged on 12/31/16 and was recovering well at home.  However, 6 days ago she had acute onset of palpitations.  She summoned EMS and was found to be in atrial fibrillation with rapid ventricular response.  She remained symptomatic despite medical therapy and underwent cardioversion on 01/10/17.  During the pre-cardioversion TEE, thickening at the intra-atrial septum patch site was noted with questionable thrombus as well as right to left atrial shunting.  The patient was discharged on diltiazem and rivaroxaban.  Today, Melissa [name] reports that she has been feeling relatively well.  She continues to have some generalized weakness and fatigue but notes that her ability to climb stairs is much improved compared with before her myxoma resection.  She is tolerating her medications well and has not noted any significant bleeding.  She also denies chest pain (other than chest wall soreness from recent sternotomy).  She has noted a couple of brief flutters in her chest but no sustained palpitations.  At the time of hospital discharge 3 days ago, she was advised to take furosemide 40 mg twice daily for 3 days due to some residual leg  edema.  Her leg swelling has improved significantly, and she is now back to 40 mg daily.  --------------------------------------------------------------------------------------------------  Cardiovascular History & Procedures: Cardiovascular Problems:  Left atrial myxoma status post resection (12/2016)  Postoperative atrial fibrillation  Risk Factors:  None  Cath/PCI:  LHC/RHC (12/27/16): Minimal plaquing of the LAD and LCx.  No significant CAD.  RA 11, RV 71/13, PA 68/33 (47), PCWP 27.  AO sat 99%, PA sat 73%.  Fick CO/CI 7.9/3.7.  PVR 2.5.  CV Surgery:  Resection of 6 cm left atrial myxoma and pericardial patch of atrial septal excision (12/28/16, Dr. Prescott Gum).  EP Procedures and Devices:  None  Non-Invasive Evaluation(s):  TTE (12/24/16): Normal LV size.  LVEF 55-60% with normal wall motion.  Severely elevated transmitral gradient (20 mmHg).  Severely dilated left atrium with large heterogeneous mass partially obstructing the mitral valve.  Normal RV size and function.  Moderate TR.  Severe pulmonary hypertension.  Recent CV Pertinent Labs: Lab Results  Component Value Date   CHOL 145 06/21/2012   HDL 64.60 06/21/2012   LDLCALC 73 06/21/2012   LDLDIRECT 111.7 07/08/2008   TRIG 36.0 06/21/2012   CHOLHDL 2 06/21/2012   INR 1.56 01/10/2017   BNP 383.4 (H) 01/08/2017   K 3.5 01/10/2017   MG 2.1 01/08/2017   BUN 9 01/10/2017   CREATININE 0.52 01/10/2017    Past medical and surgical history were reviewed and updated in EPIC.  No outpatient prescriptions have been marked as taking for the 01/14/17 encounter (Office Visit) with Kenith Trickel, Harrell Gave, MD.    Allergies: Other  Social History   Social History  .  Marital status: Married    Spouse name: N/A  . Number of children: N/A  . Years of education: N/A   Occupational History  . Not on file.   Social History Main Topics  . Smoking status: Former Smoker    Packs/day: 0.25    Years: 10.00    Types:  Cigarettes    Quit date: 1997  . Smokeless tobacco: Never Used  . Alcohol use No     Comment: Less than once a week.  . Drug use: No  . Sexual activity: Not on file   Other Topics Concern  . Not on file   Social History Narrative   Married.    Family History  Problem Relation Age of Onset  . Breast cancer Mother   . Breast cancer Sister   . Breast cancer Maternal Aunt   . Breast cancer Maternal Grandmother   . Breast cancer Maternal Aunt   . Depression Other        FMHx  . Stroke Paternal Grandfather     Review of Systems: A 12-system review of systems was performed and was negative except as noted in the HPI.  --------------------------------------------------------------------------------------------------  Physical Exam: BP 108/60   Pulse 95   Ht _0  (1.676 m)   Wt 225 lb (102.1 kg)   SpO2 97%   BMI 36.32 kg/m   General: Obese woman, seated comfortably in the exam room.  She is accompanied by her husband. HEENT: No conjunctival pallor or scleral icterus. Moist mucous membranes.  OP clear. Neck: Supple without lymphadenopathy, thyromegaly, JVD, or HJR. Lungs: Normal work of breathing. Clear to auscultation bilaterally without wheezes or crackles. Heart: Regular rate and rhythm with 1/6 systolic murmur loudest at the left lower sternal border.  No rubs or gallops.. Abd: Bowel sounds present. Soft, NT/ND without hepatosplenomegaly Ext: Trace pretibial edema bilaterally. Radial, PT, and DP pulses are 2+ bilaterally. Skin: Warm and dry without rash.  Median sternotomy incision is healing well.  EKG:  NSR with non-specific T-wave changes.  Lab Results  Component Value Date   WBC 12.1 (H) 01/10/2017   HGB 8.6 (L) 01/10/2017   HCT 28.5 (L) 01/10/2017   MCV 80.3 01/10/2017   PLT 580 (H) 01/10/2017    Lab Results  Component Value Date   NA 136 01/10/2017   K 3.5 01/10/2017   CL 103 01/10/2017   CO2 22 01/10/2017   BUN 9 01/10/2017   CREATININE 0.52  01/10/2017   GLUCOSE 92 01/10/2017   ALT 30 01/08/2017    Lab Results  Component Value Date   CHOL 145 06/21/2012   HDL 64.60 06/21/2012   LDLCALC 73 06/21/2012   LDLDIRECT 111.7 07/08/2008   TRIG 36.0 06/21/2012   CHOLHDL 2 06/21/2012    --------------------------------------------------------------------------------------------------  ASSESSMENT AND PLAN: Left atrial myxoma status post resection Melissa [name] is recovering well from her recent surgery.  Shortness of breath has improved significantly.  I have discussedher postoperative TEE with Dr. Meda Coffee, Dr. Johnsie Cancel, and Dr. Prescott Gum.  There is some thickening of the pericardial patch with heterogeneous echoes that could represent thrombus or other material.  Evidence of right to left flow across the repaired defect is also noted.  We have agreed to repeat a transthoracic echocardiogram with bubble study in 1 month.  Based on the results of the echo or if the patient has any new symptoms, we may need to consider cross-sectional imaging (gated CT versus MRI).  Melissa [name]  should follow-up with  Dr. Prescott Gum as planned next month.  I will defer referral to cardiac rehab to him.  Postoperative atrial fibrillation EKG today demonstrates sinus rhythm.  No symptoms of recurrence.  We will continue with rivaroxaban and diltiazem.  If she has recurrent sustained palpitations, I asked Melissa [name] to contact us as soon as possible for further evaluation.  He did consider antiarrhythmic therapy at that point (likely amiodarone).  Severe pulmonary hypertension and heart failure with preserved ejection fraction Symptoms are gradually improving and were were likely driven by functional mitral stenosis related to the obstructing myxoma.  We will continue with furosemide 40 mg daily and repeat an echo in 1 month, as noted above.  Follow-up: Return to clinic in 1 month (following echo w/bubble study).  Nelva Bush, MD 01/14/2017 4:11 PM

## 2017-01-18 DIAGNOSIS — L918 Other hypertrophic disorders of the skin: Secondary | ICD-10-CM | POA: Diagnosis not present

## 2017-01-19 ENCOUNTER — Encounter: Payer: Self-pay | Admitting: Internal Medicine

## 2017-01-19 DIAGNOSIS — R600 Localized edema: Secondary | ICD-10-CM

## 2017-01-20 ENCOUNTER — Encounter: Payer: Self-pay | Admitting: Internal Medicine

## 2017-01-20 MED ORDER — POTASSIUM CHLORIDE ER 20 MEQ PO TBCR: 20.0000 meq | EXTENDED_RELEASE_TABLET | Freq: Every day | ORAL | 6 refills | Status: DC

## 2017-01-24 ENCOUNTER — Telehealth: Payer: Self-pay | Admitting: Internal Medicine

## 2017-01-24 DIAGNOSIS — R253 Fasciculation: Secondary | ICD-10-CM

## 2017-01-24 DIAGNOSIS — I498 Other specified cardiac arrhythmias: Secondary | ICD-10-CM

## 2017-01-24 NOTE — Telephone Encounter (Signed)
Pt is concerned because she continues to have spasm/fluttering upper chest behind sternum, very short lasting, no pattern, does not wake her up at night, rare fleeting dizziness but no other symptoms--this is similar to chest discomfort she has been experiencing and discussed with Dr End, but is more frequent.  Pt advised I will forward to Dr End for review then will follow-up with her.

## 2017-01-24 NOTE — Telephone Encounter (Signed)
It is hard to know if this represents palpitations associated with paroxysmal atrial fibrillation (or other arrhythmia) or soreness from her recent sternotomy. If it seems to be more related to palpitations, it may be helpful for [place] to perform a 48-hour Holter monitor. If it is isolated to her incision, I recommended that she discuss it further with Dr. Prescott Gum when she follows up with him.  Nelva Bush, MD [facility] Pager: [phone]

## 2017-01-24 NOTE — Telephone Encounter (Signed)
New message  Patient calling to report pain in upper chest area.   States she has felt this discomfort since surgery 10/9. Please call   Pt c/o of Chest Pain: STAT if CP now or developed within 24 hours  1. Are you having CP right now?  NO  2. Are you experiencing any other symptoms (ex. SOB, nausea, vomiting, sweating)? NO  3. How long have you been experiencing CP? About 1 month  4. Is your CP continuous or coming and going? Coming and going, multiple times a day  5. Have you taken Nitroglycerin? No ?

## 2017-01-24 NOTE — Telephone Encounter (Signed)
Discussed with patient, she would like to proceed with monitor first. Pt advised I will ask [facility] to contact her to arrange appt for 48 holter monitor.

## 2017-01-29 ENCOUNTER — Telehealth: Payer: Self-pay | Admitting: Internal Medicine

## 2017-01-29 DIAGNOSIS — I480 Paroxysmal atrial fibrillation: Secondary | ICD-10-CM

## 2017-01-29 MED ORDER — DILTIAZEM HCL 30 MG PO TABS: 30.0000 mg | ORAL_TABLET | Freq: Four times a day (QID) | ORAL | 0 refills | Status: AC | PRN

## 2017-01-29 NOTE — Telephone Encounter (Signed)
Paged by answering service. Returned call to patient and her husband. Recent CT surgery for myxoma, has had issues with atrial fibrillation post operatively. She has had periodic instances of palpitations recently, and plan had been to place monitor to evaluate her episodes. The episodes are becoming more frequent and more bothersome to her. Today she had an episode when her heart rate spiked into the 150s-160s, but her heart rate then recovers to the 70s. She denies syncope, lightheadedness with these events. During her recent hospitalization for afib with RVR, she maintained her blood pressure even with fast rhythms. The intermittent palpitations are very bothersome to her, and she would like to be seen sooner than her pending December visit for evaluation.  We discussed options including attempted additional rate control in the short term. She is on rivaroxaban at night and long acting cardizem 180 mg nightly, which she has not yet taken tonight. She would like to attempt something for additional rate control in the short term, and she'd like to be seen as soon as possible in clinic to discuss long term options.  After discussing extensively with the patient and her husband, I called a prescription into the 24 hour [facility] near the patient's house, and she will pick it up this evening. She was instructed to take 30 mg short acting diltiazem once every 6 hours as needed for fast heart rates. She should not take more than 360 mg total dose per day. I will let Dr. Darnelle Bos office know of her desire to be seen sooner, and perhaps she can be seen in the afib clinic to discuss monitor placement and long term plan for her tachyarrhythmia.  Red flag symptoms were reviewed, and the patient and her husband will not hesitate to come to ER if they feel additional management is needed.  Buford Dresser, MD, PhD, overnight cardiology provider

## 2017-01-31 ENCOUNTER — Encounter: Payer: Self-pay | Admitting: Internal Medicine

## 2017-01-31 ENCOUNTER — Other Ambulatory Visit: Payer: Self-pay | Admitting: Cardiothoracic Surgery

## 2017-01-31 ENCOUNTER — Ambulatory Visit (INDEPENDENT_AMBULATORY_CARE_PROVIDER_SITE_OTHER): Payer: 59 | Admitting: Internal Medicine

## 2017-01-31 VITALS — BP 120/72 | HR 85 | Ht 66.0 in | Wt 225.0 lb

## 2017-01-31 DIAGNOSIS — Z86018 Personal history of other benign neoplasm: Secondary | ICD-10-CM

## 2017-01-31 DIAGNOSIS — I272 Pulmonary hypertension, unspecified: Secondary | ICD-10-CM

## 2017-01-31 DIAGNOSIS — I5030 Unspecified diastolic (congestive) heart failure: Secondary | ICD-10-CM

## 2017-01-31 DIAGNOSIS — I5189 Other ill-defined heart diseases: Secondary | ICD-10-CM

## 2017-01-31 DIAGNOSIS — I480 Paroxysmal atrial fibrillation: Secondary | ICD-10-CM

## 2017-01-31 MED ORDER — METOPROLOL TARTRATE 25 MG PO TABS: ORAL_TABLET | ORAL | 1 refills | Status: DC

## 2017-01-31 NOTE — Patient Instructions (Signed)
Medication Instructions:  Start metoprolol tartrate (lopressor) 12.5 mg two times a day. This will be 1/2 of a 25 mg tablet two times a day.  Labwork: None today.  Testing/Procedures: None today   Follow-Up: Your physician recommends that you schedule a follow-up appointment already scheduled with Dr End December 3,2018 at 4:20 PM.     Any Other Special Instructions Will Be Listed Below (If Applicable).     If you need a refill on your cardiac medications before your next appointment, please call your pharmacy.

## 2017-01-31 NOTE — Progress Notes (Signed)
Follow-up Outpatient Visit Date: 01/31/2017  Primary Care Provider: Maurice Small, MD [place] [address] [place] [address]  Chief Complaint: Palpitations  HPI:  Melissa [name] is a 51 y.o. year-old female with history of  left atrial myxoma status post resection complicated by postoperative atrial fibrillation, who presents for evaluation of increasing palpitations. I last saw Melissa [name] on 01/14/17 for follow-up of postoperative atrial fibrillation after myxoma resection. She had undergone successful TEE guided cardioversion on 01/10/17, though thickening of the intra-atrial septum patch was noted with questionable thrombus and right to left shunting. We agreed to continue with diltiazem and rivaroxaban at her last visit. Over the weekend, Melissa [name] contacted her on-call physician, complaining of patient's. She was given a prescription for as needed diltiazem, which she has been taking regularly since that time.  Melissa [name] reports that she was watching television 2 nights ago and suddenly flet heart racing. Her Apple Watch indicated a heart rate around 150 bpm, which persisted for some time. She spoke with the on-call physician and began taking diltiazem 30 mg every 6 hours with gradual decline in her heart rate. Yesterday and overnight until today, she has felt back to normal. Leading up to the onset of the rapid heart rate 2 nights ago, she had noticed brief flutters in her chest but otherwise had been asymptomatic. She notes mild chest wall soreness but otherwise is without chest pain. She reports continued improvement in her dyspnea on exertion. She has not had any lightheadedness. She is scheduled to see Dr. Prescott Gum later this week (11/4).  --------------------------------------------------------------------------------------------------  Cardiovascular History & Procedures: Cardiovascular Problems:  Left atrial myxoma status post resection (12/2016)  Postoperative atrial  fibrillation  Risk Factors:  None  Cath/PCI:  LHC/RHC (12/27/16): Minimal plaquing of the LAD and LCx.  No significant CAD.  RA 11, RV 71/13, PA 68/33 (47), PCWP 27.  AO sat 99%, PA sat 73%.  Fick CO/CI 7.9/3.7.  PVR 2.5.  CV Surgery:  Resection of 6 cm left atrial myxoma and pericardial patch of atrial septal excision (12/28/16, Dr. Prescott Gum).  EP Procedures and Devices:  None  Non-Invasive Evaluation(s):  TTE (12/24/16): Normal LV size.  LVEF 55-60% with normal wall motion.  Severely elevated transmitral gradient (20 mmHg).  Severely dilated left atrium with large heterogeneous mass partially obstructing the mitral valve.  Normal RV size and function.  Moderate TR.  Severe pulmonary hypertension.  Past medical and surgical history were reviewed and updated in EPIC.  Current Meds  Medication Sig  . acetaminophen (TYLENOL) 500 MG tablet Take 2 tablets (1,000 mg total) by mouth every 8 (eight) hours as needed.  Marland Kitchen albuterol (PROVENTIL HFA) 108 (90 Base) MCG/ACT inhaler Inhale 2 puffs into the lungs as needed.  . cetirizine (ZYRTEC) 10 MG tablet Take 10 mg by mouth daily as needed for allergies.   . Cholecalciferol (VITAMIN D) 2000 units CAPS Take 2,000 Units by mouth daily.  Marland Kitchen diltiazem (CARDIZEM CD) 180 MG 24 hr capsule Take 1 capsule (180 mg total) by mouth daily.  Marland Kitchen diltiazem (CARDIZEM) 30 MG tablet Take 1 tablet (30 mg total) every 6 (six) hours as needed by mouth. For fast heart rate. Continue long acting diltiazem, total of no more than 360 mg per day  . furosemide (LASIX) 40 MG tablet Take 1 tablet (40 mg total) by mouth daily.  . hydrOXYzine (ATARAX/VISTARIL) 10 MG tablet Take 10 mg by mouth 3 (three) times daily as needed (migraines).  Marland Kitchen  levalbuterol (XOPENEX) 1.25 MG/0.5ML nebulizer solution Take 1.25 mg by nebulization 2 (two) times daily.  . Mometasone Furoate (ASMANEX HFA) 200 MCG/ACT AERO Inhale 200 mcg into the lungs 2 (two) times daily.  . Multiple Vitamin  (MULTIVITAMIN) tablet Take 1 tablet by mouth 2 (two) times daily as needed.   . Multiple Vitamins-Minerals (PRESERVISION AREDS 2 PO) Take 1 tablet daily by mouth. Preservision  . Nebulizers (COMPRESSOR/NEBULIZER) MISC 1.25 mg by Does not apply route 2 (two) times daily.  . Potassium Chloride ER 20 MEQ TBCR Take 20 mEq by mouth daily.  . pramipexole (MIRAPEX) 1 MG tablet Take 1 mg by mouth 2 (two) times daily as needed.  . promethazine (PHENERGAN) 12.5 MG tablet Take 1-2 tablets by mouth 3 (three) times daily as needed for nausea/vomiting.  . rivaroxaban (XARELTO) 20 MG TABS tablet Take 1 tablet (20 mg total) by mouth daily.  . SUMAtriptan 6 MG/0.5ML SOAJ Inject one at onset of severe headache; may repeat once in 2 hours if HA persists  . tiZANidine (ZANAFLEX) 2 MG tablet Take 2 mg by mouth every 6 (six) hours as needed (headache).     Allergies: Other  Social History   Socioeconomic History  . Marital status: Married    Spouse name: Not on file  . Number of children: Not on file  . Years of education: Not on file  . Highest education level: Not on file  Social Needs  . Financial resource strain: Not on file  . Food insecurity - worry: Not on file  . Food insecurity - inability: Not on file  . Transportation needs - medical: Not on file  . Transportation needs - non-medical: Not on file  Occupational History  . Not on file  Tobacco Use  . Smoking status: Former Smoker    Packs/day: 0.25    Years: 10.00    Pack years: 2.50    Types: Cigarettes    Last attempt to quit: 1997    Years since quitting: 21.8  . Smokeless tobacco: Never Used  Substance and Sexual Activity  . Alcohol use: No    Comment: Less than once a week.  . Drug use: No  . Sexual activity: Not on file  Other Topics Concern  . Not on file  Social History Narrative   Married.    Family History  Problem Relation Age of Onset  . Breast cancer Mother   . Breast cancer Sister   . Breast cancer Maternal Aunt    . Breast cancer Maternal Grandmother   . Breast cancer Maternal Aunt   . Depression Other        FMHx  . Stroke Paternal Grandfather     Review of Systems: Suture ends becoming exposed at sternotomy incision. Otherwise, a 12-system review of systems was performed and was negative except as noted in the HPI.  --------------------------------------------------------------------------------------------------  Physical Exam: BP 120/72   Pulse 85   Ht 5\' 6"  (1.676 m)   Wt 225 lb (102.1 kg)   BMI 36.32 kg/m   General:  Obese woman, seated comfortably in the exam room. She is accompanied by her husband. HEENT: No conjunctival pallor or scleral icterus. Moist mucous membranes.  OP clear. Neck: Supple without lymphadenopathy, thyromegaly, JVD, or HJR. Lungs: Normal work of breathing. Clear to auscultation bilaterally without wheezes or crackles. Heart: Regular rate and rhythm without murmurs, rubs, or gallops. Non-displaced PMI. Abd: Bowel sounds present. Soft, NT/ND without hepatosplenomegaly Ext: Trace pretibial edema bilaterally. Radial, PT, and  DP pulses are 2+ bilaterally. Skin: Warm and dry without rash. Median sternotomy with pink granulation tissue.  EKG:  Normal sinus rhythm without significant abnormalities.  Lab Results  Component Value Date   WBC 12.1 (H) 01/10/2017   HGB 8.6 (L) 01/10/2017   HCT 28.5 (L) 01/10/2017   MCV 80.3 01/10/2017   PLT 580 (H) 01/10/2017    Lab Results  Component Value Date   NA 136 01/10/2017   K 3.5 01/10/2017   CL 103 01/10/2017   CO2 22 01/10/2017   BUN 9 01/10/2017   CREATININE 0.52 01/10/2017   GLUCOSE 92 01/10/2017   ALT 30 01/08/2017    Lab Results  Component Value Date   CHOL 145 06/21/2012   HDL 64.60 06/21/2012   LDLCALC 73 06/21/2012   LDLDIRECT 111.7 07/08/2008   TRIG 36.0 06/21/2012   CHOLHDL 2 06/21/2012     --------------------------------------------------------------------------------------------------  ASSESSMENT AND PLAN: Paroxysmal atrial fibrillation EKG today demonstrates normal sinus rhythm, though episode 2 days ago is suspicious for atrial fibrillation. We have agreed to continue diltiazem 180 mg daily and add metoprolol tartrate 12.5 mg BID. The latter can be uptitrated as tolerated for improved heart rate control. Ms. Heyboer can continue to use as needed diltiazem for breakthrough palpitations/tachycardia. We discussed trying amiodarone, but will avoid this unless she has continued symptoms despite maximization of metoprolol. We will continue with rivaroxaban, given recent cardioversion and concern for possible thrombus at atrial septum repair site. We will also proceed with Holter monitor, as previously ordered.  Atrial myxoma status post resection Ms. Kuhnle is recovering well following her surgery last month. Post-operative TEE was concerning about thickening and possible thrombus along the patch, as well as right-to-left shunting across the patch. We discussed the findings of the recent TEE again and have agreed to proceed with transthoracic echo with bubble study later this month. I also asked Ms. Linnen to discuss the expected post-operative findings and course with Dr. Prescott Gum, whom she is seeing in 2 days.  Pulmonary hypertension and HFpEF Ms. Sharples appears euvolemic with improving shortness of breath. We will continue her current dose of furosemide and follow-up echo, as previously ordered. I will check a BMP when she present for her echo next week to ensure stable renal function and potassium.  Follow-up: Return to clinic as previously scheduled on 02/21/17.  Nelva Bush, MD 01/31/2017 9:39 AM

## 2017-02-02 ENCOUNTER — Ambulatory Visit: Payer: Self-pay | Admitting: Cardiothoracic Surgery

## 2017-02-07 ENCOUNTER — Other Ambulatory Visit: Payer: Self-pay

## 2017-02-07 ENCOUNTER — Ambulatory Visit
Admission: RE | Admit: 2017-02-07 | Discharge: 2017-02-07 | Disposition: A | Discharge status: Home / Self Care | Payer: 59 | Source: Ambulatory Visit | Attending: Cardiothoracic Surgery | Admitting: Cardiothoracic Surgery

## 2017-02-07 ENCOUNTER — Other Ambulatory Visit: Payer: Self-pay | Admitting: Cardiothoracic Surgery

## 2017-02-07 ENCOUNTER — Other Ambulatory Visit: Payer: 59 | Admitting: *Deleted

## 2017-02-07 ENCOUNTER — Ambulatory Visit (HOSPITAL_COMMUNITY): Payer: 59 | Attending: Cardiology

## 2017-02-07 ENCOUNTER — Ambulatory Visit (INDEPENDENT_AMBULATORY_CARE_PROVIDER_SITE_OTHER): Payer: Self-pay | Admitting: Physician Assistant

## 2017-02-07 VITALS — BP 110/73 | HR 80 | Ht 66.0 in | Wt 225.0 lb

## 2017-02-07 DIAGNOSIS — I420 Dilated cardiomyopathy: Secondary | ICD-10-CM | POA: Diagnosis not present

## 2017-02-07 DIAGNOSIS — D538 Other specified nutritional anemias: Secondary | ICD-10-CM | POA: Diagnosis not present

## 2017-02-07 DIAGNOSIS — I480 Paroxysmal atrial fibrillation: Secondary | ICD-10-CM

## 2017-02-07 DIAGNOSIS — R918 Other nonspecific abnormal finding of lung field: Secondary | ICD-10-CM | POA: Diagnosis not present

## 2017-02-07 DIAGNOSIS — I5189 Other ill-defined heart diseases: Secondary | ICD-10-CM

## 2017-02-07 DIAGNOSIS — I5030 Unspecified diastolic (congestive) heart failure: Secondary | ICD-10-CM

## 2017-02-07 DIAGNOSIS — I071 Rheumatic tricuspid insufficiency: Secondary | ICD-10-CM | POA: Diagnosis not present

## 2017-02-07 LAB — CBC AND DIFFERENTIAL
HCT: 33 — AB (ref 36–46)
Hemoglobin: 9.9 — AB (ref 12.0–16.0)
Neutrophils Absolute: 4
Platelets: 436 — AB (ref 150–399)
WBC: 7.4

## 2017-02-07 LAB — BASIC METABOLIC PANEL
BUN/Creatinine Ratio: 13 (ref 9–23)
BUN: 8 mg/dL (ref 6–24)
CALCIUM: 9.3 mg/dL (ref 8.7–10.2)
CHLORIDE: 102 mmol/L (ref 96–106)
CO2: 23 mmol/L (ref 20–29)
Creatinine, Ser: 0.63 mg/dL (ref 0.57–1.00)
GFR, EST AFRICAN AMERICAN: 119 mL/min/{1.73_m2} (ref >59)
GFR, EST NON AFRICAN AMERICAN: 103 mL/min/{1.73_m2} (ref >59)
Glucose: 93 mg/dL (ref 65–99)
Potassium: 4 mmol/L (ref 3.5–5.2)
Sodium: 142 mmol/L (ref 134–144)

## 2017-02-07 NOTE — Patient Instructions (Signed)
You are encouraged to enroll and participate in the outpatient cardiac rehab program beginning as soon as practical.   You may return to driving an automobile as long as you are no longer requiring oral narcotic pain relievers during the daytime.  It would be wise to start driving only short distances during the daylight and gradually increase from there as you feel comfortable.   You may continue to gradually increase your physical activity as tolerated.  Refrain from any heavy lifting or strenuous use of your arms and shoulders until at least 8 weeks from the time of your surgery, and avoid activities that cause increased pain in your chest on the side of your surgical incision.  Otherwise you may continue to increase activities without any particular limitations.  Increase the intensity and duration of physical activity gradually.  

## 2017-02-07 NOTE — Progress Notes (Signed)
HPI:  Patient returns for routine postoperative follow-up having undergone resection of LA myxoma and pericardial patch of atrial septal excision on 12/28/2016 by Dr. Prescott Gum. Since hospital discharge the patient reports she felt palpitations, her heart racing, and she had a very fast heart rate. EMS was summonsed at her home and she was found to be in atrial fibrillation with RVR. She remained symptomatic despite medical therapy and underwent cardioversion on 01/10/17.  During the pre-cardioversion TEE, thickening at the intra-atrial septum patch site was noted with questionable thrombus as well as right to left atrial shunting.  The patient was discharged on 01/11/2017 with diltiazem and rivaroxaban. She was already on Lopressor 12.5 mg bid, which she continued to take.   A Holter monitor is also going to be arranged for tomorrow. Also, a bmet is to be drawn (ordered by cardiology). She denies shortness of breath or chest pain at today's visit.  Current Outpatient Medications  Medication Sig Dispense Refill  . acetaminophen (TYLENOL) 500 MG tablet Take 2 tablets (1,000 mg total) by mouth every 8 (eight) hours as needed. 30 tablet 0  . albuterol (PROVENTIL HFA) 108 (90 Base) MCG/ACT inhaler Inhale 2 puffs into the lungs as needed.    . cetirizine (ZYRTEC) 10 MG tablet Take 10 mg by mouth daily as needed for allergies.     . Cholecalciferol (VITAMIN D) 2000 units CAPS Take 2,000 Units by mouth daily.    Marland Kitchen diltiazem (CARDIZEM CD) 180 MG 24 hr capsule Take 1 capsule (180 mg total) by mouth daily. 90 capsule 2  . diltiazem (CARDIZEM) 30 MG tablet Take 1 tablet (30 mg total) every 6 (six) hours as needed by mouth. For fast heart rate. Continue long acting diltiazem, total of no more than 360 mg per day 30 tablet 0  . furosemide (LASIX) 40 MG tablet Take 1 tablet (40 mg total) by mouth daily. 30 tablet 11  . HYDROcodone-acetaminophen (NORCO) 7.5-325 MG tablet Take 1 tablet as needed by mouth for  moderate pain.    . hydrOXYzine (ATARAX/VISTARIL) 10 MG tablet Take 10 mg by mouth 3 (three) times daily as needed (migraines).    Marland Kitchen levalbuterol (XOPENEX) 1.25 MG/0.5ML nebulizer solution Take 1.25 mg by nebulization 2 (two) times daily. 1 each 1  . metoprolol tartrate (LOPRESSOR) 25 MG tablet Take 1/2 tablet (12.5mg  ) by mouth two times a day 90 tablet 1  . Mometasone Furoate (ASMANEX HFA) 200 MCG/ACT AERO Inhale 200 mcg into the lungs 2 (two) times daily.    . Multiple Vitamin (MULTIVITAMIN) tablet Take 1 tablet by mouth 2 (two) times daily as needed.     . Multiple Vitamins-Minerals (PRESERVISION AREDS 2 PO) Take 1 tablet daily by mouth. Preservision    . Nebulizers (COMPRESSOR/NEBULIZER) MISC 1.25 mg by Does not apply route 2 (two) times daily. 1 each 0  . Potassium Chloride ER 20 MEQ TBCR Take 20 mEq by mouth daily. 30 tablet 6  . pramipexole (MIRAPEX) 1 MG tablet Take 1 mg by mouth 2 (two) times daily as needed.    . promethazine (PHENERGAN) 12.5 MG tablet Take 1-2 tablets by mouth 3 (three) times daily as needed for nausea/vomiting.  5  . rivaroxaban (XARELTO) 20 MG TABS tablet Take 1 tablet (20 mg total) by mouth daily. 30 tablet 0  . SUMAtriptan 6 MG/0.5ML SOAJ Inject one at onset of severe headache; may repeat once in 2 hours if HA persists    . tiZANidine (ZANAFLEX) 2 MG tablet Take  2 mg by mouth every 6 (six) hours as needed (headache).   5  Vital Signs: BP 110/73, HR 80,oxygenation 98% on room air   Physical Exam: CV-RRR Pulmonary-Clear to auscultation bilaterally Extremities-Trace pre tibial edema bilaterally Wound-Clean and dry  Diagnostic Tests: CLINICAL DATA:  Atrial neck stomach position.  EXAM: CHEST  2 VIEW  COMPARISON:  01/08/2017.  FINDINGS: Trachea is midline. Heart size normal. There may be minimal scarring in the lingula. Lungs are otherwise clear. No pleural fluid.  IMPRESSION: No acute findings.   Electronically Signed   By: Lorin Picket  M.D.   On: 02/07/2017 14:43  Impression and Plan: She is continuing to recover from her heart surgery. She is concerned about the results of the TEE done on 01/10/2017.The patch appeared significantly thickened with a possible dehiscence and a thrombus. There is right to left flow across the interatrial septum. She had another TTE complete bubble study done today. There is no mention of previous results today. Per patient request, I will ask Dr. Prescott Gum to evaluate and determine if patch is intact and whether or not there is a thrombus. She was also instructed to inquire with [name], as she is scheduled to have Holter monitor tomorrow. Patient has a history of anemia and had anemia post op. She requests a CBC be done and this was arranged at [facility]. My nurse will follow up the results in am.     Patient states she has already been driving short distances. I instructed her to continue with this for at least the rest of this week. Afterward, she may resume her normal driving schedule. Also, she is to continue with sternal precautions (I.e. No lifting more than 10 pounds for the next 2 weeks). She may resume normal activities after this time period. She asked to be referred to [facility] cardiac rehab, which the nurse has arranged. She will return to see Dr. Prescott Gum in 3-4 weeks. She is also scheduled to see [name] on 02/21/2017.    Nani Skillern, PA-C [facility] [phone]

## 2017-02-08 ENCOUNTER — Other Ambulatory Visit: Payer: Self-pay | Admitting: Internal Medicine

## 2017-02-08 ENCOUNTER — Telehealth: Payer: Self-pay | Admitting: Internal Medicine

## 2017-02-08 ENCOUNTER — Ambulatory Visit (INDEPENDENT_AMBULATORY_CARE_PROVIDER_SITE_OTHER): Payer: 59

## 2017-02-08 DIAGNOSIS — R002 Palpitations: Secondary | ICD-10-CM

## 2017-02-08 DIAGNOSIS — F32A Depression, unspecified: Secondary | ICD-10-CM | POA: Insufficient documentation

## 2017-02-08 DIAGNOSIS — F988 Other specified behavioral and emotional disorders with onset usually occurring in childhood and adolescence: Secondary | ICD-10-CM | POA: Insufficient documentation

## 2017-02-08 DIAGNOSIS — R253 Fasciculation: Secondary | ICD-10-CM

## 2017-02-08 DIAGNOSIS — I498 Other specified cardiac arrhythmias: Secondary | ICD-10-CM

## 2017-02-08 DIAGNOSIS — F329 Major depressive disorder, single episode, unspecified: Secondary | ICD-10-CM | POA: Insufficient documentation

## 2017-02-08 DIAGNOSIS — T7840XA Allergy, unspecified, initial encounter: Secondary | ICD-10-CM | POA: Insufficient documentation

## 2017-02-08 DIAGNOSIS — M1990 Unspecified osteoarthritis, unspecified site: Secondary | ICD-10-CM | POA: Insufficient documentation

## 2017-02-08 DIAGNOSIS — G43909 Migraine, unspecified, not intractable, without status migrainosus: Secondary | ICD-10-CM | POA: Insufficient documentation

## 2017-02-08 NOTE — Telephone Encounter (Signed)
Echo results reviewed with Ms. Peckham, demonstrating improved pulmonary artery pressures.  Normal LVEF noted.  No evidence of right-to-left shunting by bubble study.  Since our visit last week, at which time metoprolol was added, Ms. Caissie has noted improvement in her palpitations.  We will continue her current medications.  Of note, BMP obtained yesterday also shows normal potassium.  We will follow-up as previously scheduled in early December.  Nelva Bush, MD [facility] Pager: [phone]

## 2017-02-16 ENCOUNTER — Telehealth (HOSPITAL_COMMUNITY): Payer: Self-pay

## 2017-02-16 NOTE — Telephone Encounter (Signed)
Called and spoke with patient in regards to Cardiac Rehab - Scheduled orientation on 03/10/2017 at 8:30am. Patient will attend the 11:15am exc class.

## 2017-02-17 DIAGNOSIS — G43119 Migraine with aura, intractable, without status migrainosus: Secondary | ICD-10-CM | POA: Diagnosis not present

## 2017-02-21 ENCOUNTER — Ambulatory Visit (INDEPENDENT_AMBULATORY_CARE_PROVIDER_SITE_OTHER): Payer: 59 | Admitting: Internal Medicine

## 2017-02-21 ENCOUNTER — Encounter: Payer: Self-pay | Admitting: Internal Medicine

## 2017-02-21 VITALS — BP 122/68 | HR 88 | Ht 66.0 in | Wt 226.0 lb

## 2017-02-21 DIAGNOSIS — I481 Persistent atrial fibrillation: Secondary | ICD-10-CM | POA: Diagnosis not present

## 2017-02-21 DIAGNOSIS — I5032 Chronic diastolic (congestive) heart failure: Secondary | ICD-10-CM | POA: Diagnosis not present

## 2017-02-21 DIAGNOSIS — I4819 Other persistent atrial fibrillation: Secondary | ICD-10-CM

## 2017-02-21 DIAGNOSIS — Z86018 Personal history of other benign neoplasm: Secondary | ICD-10-CM | POA: Diagnosis not present

## 2017-02-21 DIAGNOSIS — I272 Pulmonary hypertension, unspecified: Secondary | ICD-10-CM

## 2017-02-21 MED ORDER — METOPROLOL TARTRATE 25 MG PO TABS: 25.0000 mg | ORAL_TABLET | Freq: Two times a day (BID) | ORAL | 3 refills | Status: DC

## 2017-02-21 NOTE — Progress Notes (Signed)
Follow-up Outpatient Visit Date: 02/21/2017  Primary Care Provider: Maurice Small, MD [place] [address] [address]  Chief Complaint: Follow-up of atrial myxoma status post resection and persistent atrial fibrillation  HPI:  Melissa [name] is a 51 y.o. year-old female with history of left atrial myxoma status post resection complicated by postoperative atrial fibrillation, who presents for follow-up of of palpitations.  I last saw Ms. Maneri on 01/31/17, which time she noted increasing palpitations following TEE guided cardioversion.  We added metoprolol, which has improved the palpitations significantly.  Subsequent Holter monitor showed PACs and PVCs but no sustained arrhythmias (including no atrial fibrillation).  Today, Ms. Schader notes she is she still occasionally feels as though her heart is pounding.  Last night, she notes that her heart rate was up to 120 bpm, though it felt regular.  She is tolerating metoprolol well.  She notes some chest soreness around the median sternotomy but does not have any other pain.  She denies shortness of breath, lightheadedness, and edema.  She would like to begin walking more.  She is scheduled to begin cardiac rehab on 12/26 but wonders if she needs to go.  She remains on rivaroxaban without significant bleeding.  --------------------------------------------------------------------------------------------------  Cardiovascular History & Procedures: Cardiovascular Problems:  Left atrial myxoma status post resection (12/2016)  Postoperative atrial fibrillation  Risk Factors:  None  Cath/PCI:  LHC/RHC (12/27/16): Minimal plaquing of the LAD and LCx. No significant CAD. RA 11, RV 71/13, PA 68/33 (47), PCWP 27. AO sat 99%, PA sat 73%. Fick CO/CI 7.9/3.7. PVR 2.5.  CV Surgery:  Resection of 6 cm left atrial myxoma and pericardial patch of atrial septal excision (12/28/16, Dr.Van Trigt).  EP Procedures and  Devices:  48-hour Holter monitor (02/08/17): Sinus rhythm with rare PACs and PVCs.  Single 4 beat atrial run.  No atrial fibrillation or other sustained arrhythmia.  Non-Invasive Evaluation(s):  TTE with bubble study (02/07/17): Normal LV size with LVEF of 55-60%.  Normal wall motion.  Mildly dilated left atrium.  No ASD or PFO (negative bubble study).  Mild TR.  Normal pulmonary artery pressure.  Normal RV size and function.  TTE (12/24/16): Normal LV size. LVEF 55-60% with normal wall motion. Severely elevated transmitral gradient (20 mmHg). Severely dilated left atrium with large heterogeneous mass partially obstructing the mitral valve. Normal RV size and function. Moderate TR. Severe pulmonary hypertension.  Recent CV Pertinent Labs: Lab Results  Component Value Date   CHOL 145 06/21/2012   HDL 64.60 06/21/2012   LDLCALC 73 06/21/2012   LDLDIRECT 111.7 07/08/2008   TRIG 36.0 06/21/2012   CHOLHDL 2 06/21/2012   INR 1.56 01/10/2017   BNP 383.4 (H) 01/08/2017   K 4.0 02/07/2017   MG 2.1 01/08/2017   BUN 8 02/07/2017   CREATININE 0.63 02/07/2017    Past medical and surgical history were reviewed and updated in EPIC.  Current Meds  Medication Sig  . acetaminophen (TYLENOL) 500 MG tablet Take 2 tablets (1,000 mg total) by mouth every 8 (eight) hours as needed.  Marland Kitchen albuterol (PROVENTIL HFA) 108 (90 Base) MCG/ACT inhaler Inhale 2 puffs into the lungs as needed.  . cetirizine (ZYRTEC) 10 MG tablet Take 10 mg by mouth daily as needed for allergies.   . Cholecalciferol (VITAMIN D) 2000 units CAPS Take 2,000 Units by mouth daily.  Marland Kitchen diltiazem (CARDIZEM CD) 180 MG 24 hr capsule Take 1 capsule (180 mg total) by mouth daily.  Marland Kitchen diltiazem (CARDIZEM)  30 MG tablet Take 1 tablet (30 mg total) every 6 (six) hours as needed by mouth. For fast heart rate. Continue long acting diltiazem, total of no more than 360 mg per day  . furosemide (LASIX) 40 MG tablet Take 1 tablet (40 mg total) by  mouth daily.  Marland Kitchen HYDROcodone-acetaminophen (NORCO) 7.5-325 MG tablet Take 1 tablet as needed by mouth for moderate pain.  . hydrOXYzine (ATARAX/VISTARIL) 10 MG tablet Take 10 mg by mouth 3 (three) times daily as needed (migraines).  . metoprolol tartrate (LOPRESSOR) 25 MG tablet Take 1/2 tablet (12.5mg  ) by mouth two times a day  . Multiple Vitamin (MULTIVITAMIN) tablet Take 1 tablet by mouth 2 (two) times daily as needed.   . Multiple Vitamins-Minerals (PRESERVISION AREDS 2 PO) Take 1 tablet daily by mouth. Preservision  . ondansetron (ZOFRAN-ODT) 4 MG disintegrating tablet Take 4 mg by mouth as needed.   . pantoprazole (PROTONIX) 40 MG tablet Take 40 mg by mouth daily.   . Potassium Chloride ER 20 MEQ TBCR Take 20 mEq by mouth daily.  . pramipexole (MIRAPEX) 1 MG tablet Take 1 mg by mouth 2 (two) times daily as needed.  . promethazine (PHENERGAN) 12.5 MG tablet Take 1-2 tablets by mouth 3 (three) times daily as needed for nausea/vomiting.  . rivaroxaban (XARELTO) 20 MG TABS tablet Take 1 tablet (20 mg total) by mouth daily.  . SUMAtriptan 6 MG/0.5ML SOAJ Inject one at onset of severe headache; may repeat once in 2 hours if HA persists  . tiZANidine (ZANAFLEX) 2 MG tablet Take 2 mg by mouth every 6 (six) hours as needed (headache).     Allergies: Other  Social History   Socioeconomic History  . Marital status: Married    Spouse name: Not on file  . Number of children: Not on file  . Years of education: Not on file  . Highest education level: Not on file  Social Needs  . Financial resource strain: Not on file  . Food insecurity - worry: Not on file  . Food insecurity - inability: Not on file  . Transportation needs - medical: Not on file  . Transportation needs - non-medical: Not on file  Occupational History  . Not on file  Tobacco Use  . Smoking status: Former Smoker    Packs/day: 0.25    Years: 10.00    Pack years: 2.50    Types: Cigarettes    Last attempt to quit: 1997     Years since quitting: 21.9  . Smokeless tobacco: Never Used  Substance and Sexual Activity  . Alcohol use: No    Comment: Less than once a week.  . Drug use: No  . Sexual activity: Not on file  Other Topics Concern  . Not on file  Social History Narrative   Married.    Family History  Problem Relation Age of Onset  . Breast cancer Mother   . Breast cancer Sister   . Breast cancer Maternal Aunt   . Breast cancer Maternal Grandmother   . Breast cancer Maternal Aunt   . Depression Other        FMHx  . Stroke Paternal Grandfather     Review of Systems: A 12-system review of systems was performed and was negative except as noted in the HPI.  --------------------------------------------------------------------------------------------------  Physical Exam: BP 122/68   Pulse 88   Ht 5\' 6"  (1.676 m)   Wt 226 lb (102.5 kg)   SpO2 99%   BMI 36.48  kg/m   General: Obese woman, seated comfortably in the exam room.  She is accompanied by her husband. HEENT: No conjunctival pallor or scleral icterus. Moist mucous membranes.  OP clear. Neck: Supple without lymphadenopathy, thyromegaly, JVD, or HJR. Lungs: Normal work of breathing. Clear to auscultation bilaterally without wheezes or crackles. Heart: Regular rate and rhythm without murmurs, rubs, or gallops. Non-displaced PMI. Abd: Bowel sounds present. Soft, NT/ND without hepatosplenomegaly Ext: No lower extremity edema. Radial, PT, and DP pulses are 2+ bilaterally. Skin: Warm and dry without rash.  He is sternotomy is well-healed.   Lab Results  Component Value Date   WBC 7.4 02/07/2017   HGB 9.9 (A) 02/07/2017   HCT 33 (A) 02/07/2017   MCV 80.3 01/10/2017   PLT 436 (A) 02/07/2017    Lab Results  Component Value Date   NA 142 02/07/2017   K 4.0 02/07/2017   CL 102 02/07/2017   CO2 23 02/07/2017   BUN 8 02/07/2017   CREATININE 0.63 02/07/2017   GLUCOSE 93 02/07/2017   ALT 30 01/08/2017    Lab Results  Component  Value Date   CHOL 145 06/21/2012   HDL 64.60 06/21/2012   LDLCALC 73 06/21/2012   LDLDIRECT 111.7 07/08/2008   TRIG 36.0 06/21/2012   CHOLHDL 2 06/21/2012    --------------------------------------------------------------------------------------------------  ASSESSMENT AND PLAN: Persistent atrial fibrillation Recent event monitor did not show any evidence of atrial fibrillation.  Heart rate is regular today.  Ms. Majer is tolerating metoprolol tartrate well.  We will increase this to 25 mg twice daily.  If low blood pressure becomes a problem, I favor discontinuation of diltiazem with further up titration of metoprolol, as tolerated.  Continue rivaroxaban, given CHADSVASC score of at least 2 (gender and heart failure).  Left atrial myxoma status post resection Ms. Wygant continues to recover well from her surgery.  Recent echo showed no significant right to left shunt by color Doppler or bubble study.  Continue follow-up with Dr. Prescott Gum, as planned.  We will defer additional cardiac imaging at this time, given continued symptomatic improvement and reassuring echo with bubble study last month.  I encouraged Ms. Abreu to try cardiac rehab.  She can also begin walking, as tolerated, at home.  Chronic diastolic heart failure and pulmonary hypertension Ms. Furney appears euvolemic with NYHA class II symptoms.  Echo showed resolution of pulmonary hypertension following myxoma resection.  We will continue with furosemide 40 mg daily.  Recent BMP showed normal renal function and potassium.  Follow-up: Return to clinic in 2 months.  Nelva Bush, MD 02/21/2017 4:51 PM

## 2017-02-21 NOTE — Patient Instructions (Addendum)
Medication Instructions:  Increase metoprolol tartrate to 25 mg two times a day  Labwork: None   Testing/Procedures: None   Follow-Up: Your physician recommends that you schedule a follow-up appointment in: 2 months with Dr End. This is scheduled for Thursday February 7,2019 at 4:20 PM.       If you need a refill on your cardiac medications before your next appointment, please call your pharmacy.

## 2017-02-22 ENCOUNTER — Encounter: Payer: Self-pay | Admitting: Internal Medicine

## 2017-02-22 DIAGNOSIS — I5032 Chronic diastolic (congestive) heart failure: Secondary | ICD-10-CM | POA: Insufficient documentation

## 2017-02-22 DIAGNOSIS — I4819 Other persistent atrial fibrillation: Secondary | ICD-10-CM | POA: Insufficient documentation

## 2017-03-03 ENCOUNTER — Telehealth (HOSPITAL_COMMUNITY): Payer: Self-pay | Admitting: Pharmacist

## 2017-03-04 NOTE — Telephone Encounter (Signed)
Cardiac Rehab Medication Review by a Pharmacist  Does the patient feel that his/her medications are working for him/her?  yes  Has the patient been experiencing any side effects to the medications prescribed?  no  Does the patient measure his/her own blood pressure or blood glucose at home?  no   Does the patient have any problems obtaining medications due to transportation or finances?   no  Understanding of regimen: good Understanding of indications: good Potential of compliance: good  Pharmacist comments: Patient presents for cardiac rehab orientation. Able to identify and confirm or deny each medication when prompted. Reports no issues of concern. She has a blood pressure cuff at home, but does not use it regularly.  Melissa [name], PharmD PGY1 Pharmacy Resident Pager: [phone] 03/04/2017 3:06 PM

## 2017-03-08 ENCOUNTER — Telehealth (HOSPITAL_COMMUNITY): Payer: Self-pay

## 2017-03-08 NOTE — Telephone Encounter (Signed)
Patient called and spoke with Thayer Headings - Patient stated she needed to cancel orientation and all appts due to financial reasons. Patient cannot afford the co-pay. Thayer Headings mentioned Maintenance Self-pay program. Patient said she would like to speak with Dr.Van Trigt at her appt. Patient stated she will call if Dr says Maintenance is okay. Closing this referral.

## 2017-03-09 ENCOUNTER — Ambulatory Visit (INDEPENDENT_AMBULATORY_CARE_PROVIDER_SITE_OTHER): Payer: Self-pay | Admitting: Cardiothoracic Surgery

## 2017-03-09 ENCOUNTER — Other Ambulatory Visit: Payer: Self-pay

## 2017-03-09 ENCOUNTER — Encounter: Payer: Self-pay | Admitting: Cardiothoracic Surgery

## 2017-03-09 VITALS — BP 125/81 | HR 85 | Ht 66.0 in | Wt 225.0 lb

## 2017-03-09 DIAGNOSIS — Z86018 Personal history of other benign neoplasm: Secondary | ICD-10-CM

## 2017-03-09 NOTE — Progress Notes (Signed)
PCP is Maurice Small, MD Referring Provider is End, Harrell Gave, MD  Chief Complaint  Patient presents with  . Follow-up   2 month follow-up after resection of a large left atrial myxoma. Patient developed atrial fibrillation after discharge and is now on Cardizem and xarelto. A. Fib has been controlled. She had a postop TEE month ago which showed no recurrent tumor, no ASD and good biventricular function with resolution of pulmonary hypertension  Patient still has problems with insomnia, anxiety, and has not been walking enough because of the poor winter weather. The surgical incision is healing well and she is not taking any narcotics.no migraine headaches since resection of the myxoma.   Past Medical History:  Diagnosis Date  . (HFpEF) heart failure with preserved ejection fraction ([place])   . ADD (attention deficit disorder)   . Allergy   . Arthritis   . Asthma   . 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 Depression   . Migraine   . Osteoporosis   . Pulmonary hypertension ([place])   . RLS (restless legs syndrome)     Past Surgical History:  Procedure Laterality Date  . ABDOMINAL HYSTERECTOMY    . CARDIOVERSION N/A 01/10/2017   Procedure: CARDIOVERSION;  Surgeon: Dorothy Spark, MD;  Location: [facility] ENDOSCOPY;  Service: Cardiovascular;  Laterality: N/A;  . CHOLECYSTECTOMY    . EXCISION OF ATRIAL MYXOMA Left 12/28/2016   Procedure: EXCISION OF LEFT ATRIAL MYXOMA;  Surgeon: Ivin Poot, MD;  Location: [facility];  Service: Open Heart Surgery;  Laterality: Left;  . FOOT SURGERY    . GASTRIC BYPASS    . INTRAOPERATIVE TRANSESOPHAGEAL ECHOCARDIOGRAM N/A 12/28/2016   Procedure: INTRAOPERATIVE TRANSESOPHAGEAL ECHOCARDIOGRAM;  Surgeon: Ivin Poot, MD;  Location: [facility];  Service: Open Heart Surgery;  Laterality: N/A;  . TEE WITHOUT CARDIOVERSION N/A 01/10/2017   Procedure: TRANSESOPHAGEAL ECHOCARDIOGRAM (TEE);  Surgeon: Dorothy Spark, MD;  Location: [facility];  Service: Cardiovascular;   Laterality: N/A;  . TOTAL KNEE ARTHROPLASTY    . TUBAL LIGATION      Family History  Problem Relation Age of Onset  . Breast cancer Mother   . Breast cancer Sister   . Breast cancer Maternal Aunt   . Breast cancer Maternal Grandmother   . Breast cancer Maternal Aunt   . Depression Other        FMHx  . Stroke Paternal Grandfather     Social History Social History   Tobacco Use  . Smoking status: Former Smoker    Packs/day: 0.25    Years: 10.00    Pack years: 2.50    Types: Cigarettes    Last attempt to quit: 1997    Years since quitting: 21.9  . Smokeless tobacco: Never Used  Substance Use Topics  . Alcohol use: No    Comment: Less than once a week.  . Drug use: No    Current Outpatient Medications  Medication Sig Dispense Refill  . acetaminophen (TYLENOL) 500 MG tablet Take 2 tablets (1,000 mg total) by mouth every 8 (eight) hours as needed. 30 tablet 0  . albuterol (PROVENTIL HFA) 108 (90 Base) MCG/ACT inhaler Inhale 2 puffs into the lungs as needed.    . cetirizine (ZYRTEC) 10 MG tablet Take 10 mg by mouth daily as needed for allergies.     . Cholecalciferol (VITAMIN D) 2000 units CAPS Take 2,000 Units by mouth daily.    Marland Kitchen diltiazem (CARDIZEM CD) 180 MG 24 hr capsule Take 1 capsule (180 mg total)  by mouth daily. 90 capsule 2  . diltiazem (CARDIZEM) 30 MG tablet Take 1 tablet (30 mg total) every 6 (six) hours as needed by mouth. For fast heart rate. Continue long acting diltiazem, total of no more than 360 mg per day 30 tablet 0  . furosemide (LASIX) 40 MG tablet Take 1 tablet (40 mg total) by mouth daily. 30 tablet 11  . HYDROcodone-acetaminophen (NORCO) 7.5-325 MG tablet Take 1 tablet as needed by mouth for moderate pain.    . hydrOXYzine (ATARAX/VISTARIL) 10 MG tablet Take 10 mg by mouth 3 (three) times daily as needed (migraines).    . metoprolol tartrate (LOPRESSOR) 25 MG tablet Take 1 tablet (25 mg total) by mouth 2 (two) times daily. 180 tablet 3  . Multiple  Vitamin (MULTIVITAMIN) tablet Take 1 tablet by mouth 2 (two) times daily as needed.     . Multiple Vitamins-Minerals (PRESERVISION AREDS 2 PO) Take 1 tablet daily by mouth. Preservision    . ondansetron (ZOFRAN-ODT) 4 MG disintegrating tablet Take 4 mg by mouth as needed.   0  . pantoprazole (PROTONIX) 40 MG tablet Take 40 mg by mouth daily.     . Potassium Chloride ER 20 MEQ TBCR Take 20 mEq by mouth daily. 30 tablet 6  . pramipexole (MIRAPEX) 1 MG tablet Take 1 mg by mouth 2 (two) times daily as needed.    . promethazine (PHENERGAN) 12.5 MG tablet Take 1-2 tablets by mouth 3 (three) times daily as needed for nausea/vomiting.  5  . rivaroxaban (XARELTO) 20 MG TABS tablet Take 1 tablet (20 mg total) by mouth daily. 30 tablet 0  . SUMAtriptan 6 MG/0.5ML SOAJ Inject one at onset of severe headache; may repeat once in 2 hours if HA persists    . tiZANidine (ZANAFLEX) 2 MG tablet Take 2 mg by mouth every 6 (six) hours as needed (headache).   5   No current facility-administered medications for this visit.     Allergies  Allergen Reactions  . Other Anaphylaxis and Other (See Comments)    HONEYDEW MELON    Review of systems  Weight stable No fever Slowly improving strength and exercise tolerance  BP 125/81 (BP Location: Left Arm, Patient Position: Sitting, Cuff Size: Large)   Pulse 85   Ht 5\' 6"  (1.676 m)   Wt 225 lb (102.1 kg)   SpO2 98%   BMI 36.32 kg/m  Physical Exam       Exam    General- alert and comfortable   Lungs- clear without rales, wheezes   Cor- regular rate and rhythm, no murmur , gallop   Abdomen- soft, non-tender   Extremities - warm, non-tender, minimal edema   Neuro- oriented, appropriate, no focal weakness  Diagnostic Tests: Chest x-ray clear  Impression: Excellent early recovery after resection of large atrial myxoma The patient may drive We discussed phase II cardiac rehabilitation however she will be unable to participate because of the large co-pay  required by her insurance policy She will start program 20 minutes of aerobic exercise daily with some light weight training and stretching. She knows she cannot lift more than 20 pounds until mid-January.  The patient remains deconditioned with palpitations and intermittent atrial fibrillation and is not ready to return to work at this time. She needs more time to improve her cardiopulmonary conditioning and to allow complete resolution of postop A. Fib. I will see her back in the office on January 23 to assess her progress and to discuss  return to work.     Len Childs, MD [facility] [phone]

## 2017-03-10 ENCOUNTER — Ambulatory Visit (HOSPITAL_COMMUNITY): Payer: 59

## 2017-03-16 ENCOUNTER — Ambulatory Visit (HOSPITAL_COMMUNITY): Payer: 59

## 2017-03-16 DIAGNOSIS — G43709 Chronic migraine without aura, not intractable, without status migrainosus: Secondary | ICD-10-CM | POA: Diagnosis not present

## 2017-03-18 ENCOUNTER — Ambulatory Visit (HOSPITAL_COMMUNITY): Payer: 59

## 2017-03-21 ENCOUNTER — Ambulatory Visit (HOSPITAL_COMMUNITY): Payer: 59

## 2017-03-23 ENCOUNTER — Ambulatory Visit (HOSPITAL_COMMUNITY): Payer: 59

## 2017-03-24 DIAGNOSIS — G43719 Chronic migraine without aura, intractable, without status migrainosus: Secondary | ICD-10-CM | POA: Diagnosis not present

## 2017-03-24 DIAGNOSIS — M797 Fibromyalgia: Secondary | ICD-10-CM | POA: Diagnosis not present

## 2017-03-24 DIAGNOSIS — G43711 Chronic migraine without aura, intractable, with status migrainosus: Secondary | ICD-10-CM | POA: Diagnosis not present

## 2017-03-24 DIAGNOSIS — G4700 Insomnia, unspecified: Secondary | ICD-10-CM | POA: Diagnosis not present

## 2017-03-24 DIAGNOSIS — G43011 Migraine without aura, intractable, with status migrainosus: Secondary | ICD-10-CM | POA: Diagnosis not present

## 2017-03-25 ENCOUNTER — Ambulatory Visit (HOSPITAL_COMMUNITY): Payer: 59

## 2017-03-25 DIAGNOSIS — G43011 Migraine without aura, intractable, with status migrainosus: Secondary | ICD-10-CM | POA: Diagnosis not present

## 2017-03-28 ENCOUNTER — Ambulatory Visit (HOSPITAL_COMMUNITY): Payer: 59

## 2017-03-29 ENCOUNTER — Encounter (INDEPENDENT_AMBULATORY_CARE_PROVIDER_SITE_OTHER): Payer: Self-pay | Admitting: Physical Medicine and Rehabilitation

## 2017-03-29 ENCOUNTER — Ambulatory Visit (INDEPENDENT_AMBULATORY_CARE_PROVIDER_SITE_OTHER): Payer: 59 | Admitting: Physical Medicine and Rehabilitation

## 2017-03-29 ENCOUNTER — Ambulatory Visit (INDEPENDENT_AMBULATORY_CARE_PROVIDER_SITE_OTHER): Payer: 59

## 2017-03-29 VITALS — BP 134/84 | HR 70

## 2017-03-29 DIAGNOSIS — M461 Sacroiliitis, not elsewhere classified: Secondary | ICD-10-CM

## 2017-03-29 DIAGNOSIS — M47816 Spondylosis without myelopathy or radiculopathy, lumbar region: Secondary | ICD-10-CM

## 2017-03-29 DIAGNOSIS — M25552 Pain in left hip: Secondary | ICD-10-CM

## 2017-03-29 MED ORDER — METHYLPREDNISOLONE ACETATE 80 MG/ML IJ SUSP
80.0000 mg | INTRAMUSCULAR | Status: AC | PRN
  Administered 2017-03-29: 80 mg via INTRA_ARTICULAR

## 2017-03-29 MED ORDER — BUPIVACAINE HCL 0.5 % IJ SOLN
2.0000 mL | INTRAMUSCULAR | Status: AC | PRN
  Administered 2017-03-29: 2 mL via INTRA_ARTICULAR

## 2017-03-29 NOTE — Patient Instructions (Signed)

## 2017-03-29 NOTE — Progress Notes (Signed)
Melissa [name] - 52 y.o. female MRN [id number]  Date of birth: 11/06/1965  Office Visit Note: Visit Date: 03/29/2017 PCP: Maurice Small, MD Referred by: Maurice Small, MD  Subjective: Chief Complaint  Patient presents with  . Lower Back - Pain   HPI: Melissa [name] is a 52 year old recent open heart surgery for myxoma blood thinner.  We have not seen the patient since 2014 or 2015.  She is followed by Joni Fears and Biagio Borg.  In 2017 she was having similar left hip and thigh pain and Aaron Edelman completed a greater trochanteric injection without much relief.  She went on to have a lumbar spine MRI which is reviewed below.  She reports that she actually had a physiatry wrist from [facility] in March 2018 inject the left SI injection with really good relief.  Feels like this same pain has returned on the left.  She has a positive Fortin finger sign.  She does not have any radicular complaints down the legs or focal weakness.  Interestingly, she has seen Dr. Estanislado Pandy at one point he ruled out rheumatologic disease but felt like she may have fibromyalgia.  She is also status post bariatric surgery.    ROS Otherwise per HPI.  Assessment & Plan: Visit Diagnoses:  1. Sacroiliitis ([place])   2. Pain in left hip   3. Spondylosis without myelopathy or radiculopathy, lumbar region     Plan: Findings:  Left chronic worsening consistent with sacroiliac joint mediated pain but could also be related to the left L4-5 facet joint.  She does have a transitional segment.  Today we will go ahead and diagnostic and hopefully therapeutic left sacroiliac joint injection with fluoroscopic guidance.  Depending on results possibly would look at facet joint block.  She is currently in physical therapy.    Meds & Orders: No orders of the defined types were placed in this encounter.   Orders Placed This Encounter  Procedures  . Sacroiliac Joint Inj  . XR C-ARM NO REPORT    Follow-up: Return if symptoms worsen  or fail to improve.   Procedures: Sacroiliac Joint Inj on 03/29/2017 12:05 PM Indications: pain and diagnostic evaluation Details: 22 G 3.5 in needle, fluoroscopy-guided posterior approach Medications: 2 mL bupivacaine 0.5 %; 80 mg methylPREDNISolone acetate 80 MG/ML Outcome: tolerated well, no immediate complications  There was excellent flow of contrast producing a partial arthrogram of the sacroiliac joint.  Procedure, treatment alternatives, risks and benefits explained, specific risks discussed. Consent was given by the patient. Immediately prior to procedure a time out was called to verify the correct patient, procedure, equipment, support staff and site/side marked as required. Patient was prepped and draped in the usual sterile fashion.      No notes on file   Clinical History: MRI LUMBAR SPINE WITHOUT CONTRAST  TECHNIQUE: Multiplanar, multisequence MR imaging of the lumbar spine was performed. No intravenous contrast was administered.  COMPARISON:  11/26/2010  FINDINGS: Mild curvature convex to the right with the apex at L3-4. Chronic and benign hemangioma within the anterior T12 vertebral body as seen previously, with smaller hemangiomas within the L2 and L3 vertebral bodies.  T11-12, T12-L1 and L1-2: Normal interspace is. The distal cord and conus are normal with conus tip at L1-2.  L2-3:  Minimal desiccation and bulging of the disc.  No stenosis.  L3-4:  Mild desiccation and bulging of the disc.  No stenosis.  L4-5: Bilateral facet degeneration and hypertrophy. Anterolisthesis of 2 mm. Mild bulging  of the disc. Mild narrowing of the lateral recesses but no neural compression. Edema of the facet joint on the left.  L5-S1: Endplate osteophytes and shallow protrusion of disc material slightly more prominent towards the right. Slight indentation of the thecal sac to the right of midline but no apparent neural compression. Mild facet degeneration without  slippage or edema.  S1-2:  Transitional and unremarkable.  IMPRESSION: S1 is a transitional vertebra.  L4-5: Facet degeneration and hypertrophy. 2 mm of anterolisthesis. Mild bulging of the disc. No compressive stenosis. Mild facet edema left more than right could be associated with back pain or referred facet syndrome pain.  L5-S1: Endplate osteophytes and shallow disc protrusion more towards the right. Slight indentation of the thecal sac but no apparent neural compression.  Compared to the previous study, no change is appreciated   Electronically Signed   By: Nelson Chimes M.D.   On: 06/13/2015 08:55  She reports that she quit smoking about 22 years ago. Her smoking use included cigarettes. She has a 2.50 pack-year smoking history. she has never used smokeless tobacco.  Recent Labs    12/25/16 0254 12/27/16 1459  HGBA1C 5.1 5.2    Objective:  VS:  HT:    WT:   BMI:     BP:134/84  HR:70bpm  TEMP: ( )  RESP:  Physical Exam  Musculoskeletal:  She has pain with external rotation of the left hip but not internal rotation.  She has a positive Fortin finger sign and good distal strength.    Ortho Exam Imaging: Xr C-arm No Report  Result Date: 03/29/2017 Please see Notes or Procedures tab for imaging impression.   Past Medical/Family/Surgical/Social History: Medications & Allergies reviewed per EMR Patient Active Problem List   Diagnosis Date Noted  . Persistent atrial fibrillation ([place]) 02/22/2017  . Chronic diastolic heart failure ([place]) 02/22/2017  . Migraine   . Depression   . Arthritis   . Allergy   . ADD (attention deficit disorder)   . Paroxysmal atrial fibrillation ([place]) 01/31/2017  . Pulmonary hypertension, unspecified ([place]) 01/14/2017  . History of atrial myxoma   . Atrial mass 12/28/2016  . Acute diastolic heart failure ([place])   . Migraines 12/24/2016  . Left atrial mass 12/24/2016  . (HFpEF) heart failure with preserved ejection fraction  ([place]) 12/24/2016  . Asthma without status asthmaticus 12/20/2016  . 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/2016  . S/P bariatric surgery 10/21/2016  . Weight gain 10/21/2016  . Iron deficiency 09/28/2016  . BMI 38.0-38.9,adult 09/24/2016  . Dyspnea on exertion 09/24/2016  . Intractable persistent migraine aura without cerebral infarction and without status migrainosus 09/24/2016  . Stress-related physiological response affecting medical condition 09/24/2016  . Vitamin D insufficiency 09/24/2016  . Fibromyalgia 08/28/2015  . Obesity (BMI 30-39.9) 08/28/2015  . Status migrainosus 07/11/2015  . Vitamin D deficiency 03/27/2015  . Migraine without aura, intractable, without status migrainosus 03/13/2015  . H/O gastric bypass 10/16/2013  . Heartburn 10/16/2013  . Osteoporosis 10/16/2013  . Hair loss 08/28/2012  . Disordered sleep 08/28/2012  . S/P gastric bypass 06/21/2012  . Routine general medical examination at a health care facility 06/21/2012  . Insomnia 06/21/2012  . Postoperative anemia 01/12/2012  . MENOPAUSE-RELATED VASOMOTOR SYMPTOMS, HOT FLASHES 12/07/2007  . RLS (restless legs syndrome) 10/26/2007  . Allergic rhinitis 12/23/2006  . HEADACHE 12/23/2006  . ALLERGIC RHINITIS, SEASONAL 11/04/2006  . SYMPTOM, DYSFUNCTION, SLEEP STAGE 11/04/2006   Past Medical History:  Diagnosis Date  . (HFpEF) heart failure with  preserved ejection fraction ([place])   . ADD (attention deficit disorder)   . Allergy   . Arthritis   . Asthma   . 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 Depression   . Migraine   . Osteoporosis   . Pulmonary hypertension ([place])   . RLS (restless legs syndrome)    Family History  Problem Relation Age of Onset  . Breast cancer Mother   . Breast cancer Sister   . Breast cancer Maternal Aunt   . Breast cancer Maternal Grandmother   . Breast cancer Maternal Aunt   . Depression Other        FMHx  . Stroke Paternal Grandfather    Past Surgical History:  Procedure Laterality Date  .  ABDOMINAL HYSTERECTOMY    . CARDIOVERSION N/A 01/10/2017   Procedure: CARDIOVERSION;  Surgeon: Dorothy Spark, MD;  Location: [facility] ENDOSCOPY;  Service: Cardiovascular;  Laterality: N/A;  . CHOLECYSTECTOMY    . EXCISION OF ATRIAL MYXOMA Left 12/28/2016   Procedure: EXCISION OF LEFT ATRIAL MYXOMA;  Surgeon: Ivin Poot, MD;  Location: [place];  Service: Open Heart Surgery;  Laterality: Left;  . FOOT SURGERY    . GASTRIC BYPASS    . INTRAOPERATIVE TRANSESOPHAGEAL ECHOCARDIOGRAM N/A 12/28/2016   Procedure: INTRAOPERATIVE TRANSESOPHAGEAL ECHOCARDIOGRAM;  Surgeon: Ivin Poot, MD;  Location: [place];  Service: Open Heart Surgery;  Laterality: N/A;  . TEE WITHOUT CARDIOVERSION N/A 01/10/2017   Procedure: TRANSESOPHAGEAL ECHOCARDIOGRAM (TEE);  Surgeon: Dorothy Spark, MD;  Location: [place];  Service: Cardiovascular;  Laterality: N/A;  . TOTAL KNEE ARTHROPLASTY    . TUBAL LIGATION     Social History   Occupational History  . Not on file  Tobacco Use  . Smoking status: Former Smoker    Packs/day: 0.25    Years: 10.00    Pack years: 2.50    Types: Cigarettes    Last attempt to quit: 1997    Years since quitting: 22.0  . Smokeless tobacco: Never Used  Substance and Sexual Activity  . Alcohol use: No    Comment: Less than once a week.  . Drug use: No  . Sexual activity: Not on file

## 2017-03-29 NOTE — Progress Notes (Deleted)
Left sided pain. Saw a physician with [facility] in March of 2018 who did a left SI joint injection. Got a lot of relief from this, but the pain has started coming back

## 2017-03-30 ENCOUNTER — Ambulatory Visit (HOSPITAL_COMMUNITY): Payer: 59

## 2017-04-01 ENCOUNTER — Ambulatory Visit (HOSPITAL_COMMUNITY): Payer: 59

## 2017-04-04 ENCOUNTER — Ambulatory Visit (HOSPITAL_COMMUNITY): Payer: 59

## 2017-04-06 ENCOUNTER — Ambulatory Visit (HOSPITAL_COMMUNITY): Payer: 59

## 2017-04-08 ENCOUNTER — Ambulatory Visit (HOSPITAL_COMMUNITY): Payer: 59

## 2017-04-11 ENCOUNTER — Ambulatory Visit (HOSPITAL_COMMUNITY): Payer: 59

## 2017-04-12 ENCOUNTER — Ambulatory Visit (INDEPENDENT_AMBULATORY_CARE_PROVIDER_SITE_OTHER): Payer: Self-pay | Admitting: Physical Medicine and Rehabilitation

## 2017-04-13 ENCOUNTER — Ambulatory Visit: Payer: 59 | Admitting: Cardiothoracic Surgery

## 2017-04-13 ENCOUNTER — Encounter: Payer: Self-pay | Admitting: Cardiothoracic Surgery

## 2017-04-13 ENCOUNTER — Ambulatory Visit (HOSPITAL_COMMUNITY): Payer: 59

## 2017-04-13 VITALS — BP 100/69 | HR 74 | Resp 20 | Ht 66.0 in

## 2017-04-13 DIAGNOSIS — Z86018 Personal history of other benign neoplasm: Secondary | ICD-10-CM

## 2017-04-13 DIAGNOSIS — Z09 Encounter for follow-up examination after completed treatment for conditions other than malignant neoplasm: Secondary | ICD-10-CM

## 2017-04-13 NOTE — Progress Notes (Signed)
PCP is Maurice Small, MD Referring Provider is End, Harrell Gave, MD  Chief Complaint  Patient presents with  . Routine Post Op    1 month f/u, discuss RTW date    HPI: The patient returns for scheduled postop follow-up after resection of a large left atrial myxoma 12/28/2016. She's had difficulty with fatigue and palpitations and is on Cardizem and metoprolol and Xarelto for postop A. fib but now maintaining sinus rhythm for several weeks.  Her overall stamina and excised tolerance have significantly improved and she feels well to return to work. She has no symptoms of heart failure. The surgical incisions are well-healed. She has some superficial dysesthetic- hypersensitivity over the upper part of the sternal incision and will try mederma skin lotion Past Medical History:  Diagnosis Date  . (HFpEF) heart failure with preserved ejection fraction ([place])   . ADD (attention deficit disorder)   . Allergy   . Arthritis   . Asthma   . 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 Depression   . Migraine   . Osteoporosis   . Pulmonary hypertension ([place])   . RLS (restless legs syndrome)     Past Surgical History:  Procedure Laterality Date  . ABDOMINAL HYSTERECTOMY    . CARDIOVERSION N/A 01/10/2017   Procedure: CARDIOVERSION;  Surgeon: Dorothy Spark, MD;  Location: [facility] ENDOSCOPY;  Service: Cardiovascular;  Laterality: N/A;  . CHOLECYSTECTOMY    . EXCISION OF ATRIAL MYXOMA Left 12/28/2016   Procedure: EXCISION OF LEFT ATRIAL MYXOMA;  Surgeon: Ivin Poot, MD;  Location: [place];  Service: Open Heart Surgery;  Laterality: Left;  . FOOT SURGERY    . GASTRIC BYPASS    . INTRAOPERATIVE TRANSESOPHAGEAL ECHOCARDIOGRAM N/A 12/28/2016   Procedure: INTRAOPERATIVE TRANSESOPHAGEAL ECHOCARDIOGRAM;  Surgeon: Ivin Poot, MD;  Location: [place];  Service: Open Heart Surgery;  Laterality: N/A;  . TEE WITHOUT CARDIOVERSION N/A 01/10/2017   Procedure: TRANSESOPHAGEAL ECHOCARDIOGRAM (TEE);  Surgeon: Dorothy Spark, MD;  Location: [place];  Service: Cardiovascular;  Laterality: N/A;  . TOTAL KNEE ARTHROPLASTY    . TUBAL LIGATION      Family History  Problem Relation Age of Onset  . Breast cancer Mother   . Breast cancer Sister   . Breast cancer Maternal Aunt   . Breast cancer Maternal Grandmother   . Breast cancer Maternal Aunt   . Depression Other        FMHx  . Stroke Paternal Grandfather     Social History Social History   Tobacco Use  . Smoking status: Former Smoker    Packs/day: 0.25    Years: 10.00    Pack years: 2.50    Types: Cigarettes    Last attempt to quit: 1997    Years since quitting: 22.0  . Smokeless tobacco: Never Used  Substance Use Topics  . Alcohol use: No    Comment: Less than once a week.  . Drug use: No    Current Outpatient Medications  Medication Sig Dispense Refill  . acetaminophen (TYLENOL) 500 MG tablet Take 2 tablets (1,000 mg total) by mouth every 8 (eight) hours as needed. 30 tablet 0  . albuterol (PROVENTIL HFA) 108 (90 Base) MCG/ACT inhaler Inhale 2 puffs into the lungs as needed.    . cetirizine (ZYRTEC) 10 MG tablet Take 10 mg by mouth daily as needed for allergies.     . Cholecalciferol (VITAMIN D) 2000 units CAPS Take 2,000 Units by mouth daily.    Marland Kitchen diltiazem (CARDIZEM  CD) 180 MG 24 hr capsule Take 1 capsule (180 mg total) by mouth daily. 90 capsule 2  . diltiazem (CARDIZEM) 30 MG tablet Take 1 tablet (30 mg total) every 6 (six) hours as needed by mouth. For fast heart rate. Continue long acting diltiazem, total of no more than 360 mg per day 30 tablet 0  . furosemide (LASIX) 40 MG tablet Take 1 tablet (40 mg total) by mouth daily. 30 tablet 11  . HYDROcodone-acetaminophen (NORCO) 7.5-325 MG tablet Take 1 tablet as needed by mouth for moderate pain.    . hydrOXYzine (ATARAX/VISTARIL) 10 MG tablet Take 10 mg by mouth 3 (three) times daily as needed (migraines).    . metoprolol tartrate (LOPRESSOR) 25 MG tablet Take 1 tablet (25 mg total)  by mouth 2 (two) times daily. 180 tablet 3  . Multiple Vitamin (MULTIVITAMIN) tablet Take 1 tablet by mouth 2 (two) times daily as needed.     . Multiple Vitamins-Minerals (PRESERVISION AREDS 2 PO) Take 1 tablet daily by mouth. Preservision    . ondansetron (ZOFRAN-ODT) 4 MG disintegrating tablet Take 4 mg by mouth as needed.   0  . pantoprazole (PROTONIX) 40 MG tablet Take 40 mg by mouth daily.     . Potassium Chloride ER 20 MEQ TBCR Take 20 mEq by mouth daily. 30 tablet 6  . pramipexole (MIRAPEX) 1 MG tablet Take 1 mg by mouth 2 (two) times daily as needed.    . promethazine (PHENERGAN) 12.5 MG tablet Take 1-2 tablets by mouth 3 (three) times daily as needed for nausea/vomiting.  5  . rivaroxaban (XARELTO) 20 MG TABS tablet Take 1 tablet (20 mg total) by mouth daily. 30 tablet 0  . SUMAtriptan 6 MG/0.5ML SOAJ Inject one at onset of severe headache; may repeat once in 2 hours if HA persists    . tiZANidine (ZANAFLEX) 2 MG tablet Take 2 mg by mouth every 6 (six) hours as needed (headache).   5   No current facility-administered medications for this visit.     Allergies  Allergen Reactions  . Other Anaphylaxis and Other (See Comments)    HONEYDEW MELON    Review of Systems  No fever Sleep improved Weight stable No edema Improved exercise tolerance and stamina  BP 100/69   Pulse 74   Resp 20   Ht 5\' 6"  (1.676 m)   SpO2 97% Comment: RA  BMI 36.32 kg/m  Physical Exam      Exam    General- alert and comfortable    Neck- no JVD, no cervical adenopathy palpable, no carotid bruit   Lungs- clear without rales, wheezes   Cor- regular rate and rhythm, no murmur , gallop   Abdomen- soft, non-tender   Extremities - warm, non-tender, minimal edema   Neuro- oriented, appropriate, no focal weakness   Diagnostic Tests: Done  Impression: Patient recovering well after sternotomy and resection of myxoma 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  Plan: Patient to  return 6 months postop for examination and assessment of progress.   Len Childs, MD [facility] [phone]

## 2017-04-15 ENCOUNTER — Ambulatory Visit (HOSPITAL_COMMUNITY): Payer: 59

## 2017-04-18 ENCOUNTER — Ambulatory Visit (HOSPITAL_COMMUNITY): Payer: 59

## 2017-04-20 ENCOUNTER — Ambulatory Visit (HOSPITAL_COMMUNITY): Payer: 59

## 2017-04-20 DIAGNOSIS — Z1231 Encounter for screening mammogram for malignant neoplasm of breast: Secondary | ICD-10-CM | POA: Diagnosis not present

## 2017-04-22 ENCOUNTER — Ambulatory Visit (HOSPITAL_COMMUNITY): Payer: 59

## 2017-04-25 ENCOUNTER — Ambulatory Visit (HOSPITAL_COMMUNITY): Payer: 59

## 2017-04-27 ENCOUNTER — Ambulatory Visit (HOSPITAL_COMMUNITY): Payer: 59

## 2017-04-27 ENCOUNTER — Telehealth: Payer: Self-pay | Admitting: Internal Medicine

## 2017-04-27 NOTE — Telephone Encounter (Signed)
Spoke with [facility] Rep she is going to fax over EF% form.

## 2017-04-27 NOTE — Telephone Encounter (Signed)
New message   [employer] calling to verify heart failure diagnosis  1) Are you calling to confirm a diagnosis or obtain personal health information (Y/N)? YES  2) If so, what information is requested? EJECTION FRACTION  Please route to Medical Records or your medical records site representative

## 2017-04-28 ENCOUNTER — Encounter: Payer: Self-pay | Admitting: Internal Medicine

## 2017-04-28 ENCOUNTER — Ambulatory Visit: Payer: 59 | Admitting: Internal Medicine

## 2017-04-28 VITALS — BP 100/76 | HR 80 | Ht 66.0 in | Wt 229.8 lb

## 2017-04-28 DIAGNOSIS — I519 Heart disease, unspecified: Secondary | ICD-10-CM

## 2017-04-28 DIAGNOSIS — I4819 Other persistent atrial fibrillation: Secondary | ICD-10-CM

## 2017-04-28 DIAGNOSIS — I481 Persistent atrial fibrillation: Secondary | ICD-10-CM

## 2017-04-28 DIAGNOSIS — I5030 Unspecified diastolic (congestive) heart failure: Secondary | ICD-10-CM | POA: Diagnosis not present

## 2017-04-28 DIAGNOSIS — G43709 Chronic migraine without aura, not intractable, without status migrainosus: Secondary | ICD-10-CM | POA: Diagnosis not present

## 2017-04-28 DIAGNOSIS — I5189 Other ill-defined heart diseases: Secondary | ICD-10-CM

## 2017-04-28 MED ORDER — FUROSEMIDE 40 MG PO TABS: ORAL_TABLET | ORAL | 3 refills | Status: AC

## 2017-04-28 MED ORDER — POTASSIUM CHLORIDE CRYS ER 20 MEQ PO TBCR: EXTENDED_RELEASE_TABLET | ORAL | 3 refills | Status: AC

## 2017-04-28 NOTE — H&P (View-Only) (Signed)
Follow-up Outpatient Visit Date: 04/28/2017  Primary Care Provider: Maurice Small, MD [address] [address]  Chief Complaint: Palpitations  HPI:  Melissa [name] is a 52 y.o. year-old female with history of left atrial myxoma status post resection complicated by postoperative atrial fibrillation, who presents for follow-up of atrial fibrillation and left atrial myxoma.  I last saw Melissa [name] in early December, at which time she was gradually recovering from her surgery.  She still noted occasional palpitations, though this was better controlled with her regimen of metoprolol and diltiazem.  We agreed to increase metoprolol to 25 mg twice daily.  We continued rivaroxaban.  Melissa [name] was seen by Dr. Prescott Gum about 2 weeks ago and was cleared to return to work.  Today, Melissa [name] reports feeling well with the excpetion of occasional brief palpitations. She recently purchased an [employer] and provides rhythm strips from the watch demonstrating sinus tachycardia with artifact at the time of her palpitations. Her energy continues to improve. She has some soreness and paresthesias at her sternotomy site. Otherwise, she is without chest pain as well as shortness of breath, edema, and orthopnea. She is not participating in cardiac rehab due to a substantial co-pay but is trying to walk and bike on her own. She has returned to work on a part-time basis.  --------------------------------------------------------------------------------------------------  Cardiovascular History & Procedures: Cardiovascular Problems:  Left atrial myxoma status post resection (12/2016)  Postoperative atrial fibrillation  Risk Factors:  None  Cath/PCI:  LHC/RHC (12/27/16): Minimal plaquing of the LAD and LCx. No significant CAD. RA 11, RV 71/13, PA 68/33 (47), PCWP 27. AO sat 99%, PA sat 73%. Fick CO/CI 7.9/3.7. PVR 2.5.  CV Surgery:  Resection of 6 cm left atrial myxoma and pericardial  patch of atrial septal excision (12/28/16, Dr.Van Trigt).  EP Procedures and Devices:  48-hour Holter monitor (02/08/17): Sinus rhythm with rare PACs and PVCs.  Single 4 beat atrial run.  No atrial fibrillation or other sustained arrhythmia.  Non-Invasive Evaluation(s):  TTE with bubble study (02/07/17): Normal LV size with LVEF of 55-60%.  Normal wall motion.  Mildly dilated left atrium.  No ASD or PFO (negative bubble study).  Mild TR.  Normal pulmonary artery pressure.  Normal RV size and function.  TTE (12/24/16): Normal LV size. LVEF 55-60% with normal wall motion. Severely elevated transmitral gradient (20 mmHg). Severely dilated left atrium with large heterogeneous mass partially obstructing the mitral valve. Normal RV size and function. Moderate TR. Severe pulmonary hypertension.   Recent CV Pertinent Labs: Lab Results  Component Value Date   CHOL 145 06/21/2012   HDL 64.60 06/21/2012   LDLCALC 73 06/21/2012   LDLDIRECT 111.7 07/08/2008   TRIG 36.0 06/21/2012   CHOLHDL 2 06/21/2012   INR 1.56 01/10/2017   BNP 383.4 (H) 01/08/2017   K 4.0 02/07/2017   MG 2.1 01/08/2017   BUN 8 02/07/2017   CREATININE 0.63 02/07/2017    Past medical and surgical history were reviewed and updated in EPIC.  Current Meds  Medication Sig  . acetaminophen (TYLENOL) 500 MG tablet Take 2 tablets (1,000 mg total) by mouth every 8 (eight) hours as needed.  Marland Kitchen albuterol (PROVENTIL HFA) 108 (90 Base) MCG/ACT inhaler Inhale 2 puffs into the lungs as needed.  . cetirizine (ZYRTEC) 10 MG tablet Take 10 mg by mouth daily as needed for allergies.   . Cholecalciferol (VITAMIN D) 2000 units CAPS Take 2,000 Units by mouth daily.  Marland Kitchen  diltiazem (CARDIZEM) 30 MG tablet Take 1 tablet (30 mg total) every 6 (six) hours as needed by mouth. For fast heart rate. Continue long acting diltiazem, total of no more than 360 mg per day  . Erenumab-aooe (AIMOVIG 140 DOSE [place]) Inject into the skin.  Eduard Roux  (AIMOVIG 140 DOSE) 70 MG/ML SOAJ Inject 70 mg into the skin every 30 (thirty) days.  Marland Kitchen HYDROcodone-acetaminophen (NORCO) 7.5-325 MG tablet Take 1 tablet as needed by mouth for moderate pain.  . hydrOXYzine (ATARAX/VISTARIL) 10 MG tablet Take 10 mg by mouth 3 (three) times daily as needed (migraines).  . metoprolol tartrate (LOPRESSOR) 25 MG tablet Take 1 tablet (25 mg total) by mouth 2 (two) times daily.  . Multiple Vitamin (MULTIVITAMIN) tablet Take 1 tablet by mouth 2 (two) times daily as needed.   . Multiple Vitamins-Minerals (PRESERVISION AREDS 2 PO) Take 1 tablet daily by mouth. Preservision  . ondansetron (ZOFRAN-ODT) 4 MG disintegrating tablet Take 4 mg by mouth as needed.   . pantoprazole (PROTONIX) 40 MG tablet Take 40 mg by mouth daily.   . pramipexole (MIRAPEX) 1 MG tablet Take 1 mg by mouth 2 (two) times daily as needed.  . promethazine (PHENERGAN) 12.5 MG tablet Take 1-2 tablets by mouth 3 (three) times daily as needed for nausea/vomiting.  . rivaroxaban (XARELTO) 20 MG TABS tablet Take 1 tablet (20 mg total) by mouth daily.  . SUMAtriptan 6 MG/0.5ML SOAJ Inject one at onset of severe headache; may repeat once in 2 hours if HA persists  . tiZANidine (ZANAFLEX) 2 MG tablet Take 2 mg by mouth every 6 (six) hours as needed (headache).   . [DISCONTINUED] diltiazem (CARDIZEM CD) 180 MG 24 hr capsule Take 1 capsule (180 mg total) by mouth daily.  . [DISCONTINUED] furosemide (LASIX) 40 MG tablet Take 1 tablet (40 mg total) by mouth daily.  . [DISCONTINUED] Potassium Chloride ER 20 MEQ TBCR Take 20 mEq by mouth daily.    Allergies: Other  Social History   Socioeconomic History  . Marital status: Married    Spouse name: Not on file  . Number of children: Not on file  . Years of education: Not on file  . Highest education level: Not on file  Social Needs  . Financial resource strain: Not on file  . Food insecurity - worry: Not on file  . Food insecurity - inability: Not on file  .  Transportation needs - medical: Not on file  . Transportation needs - non-medical: Not on file  Occupational History  . Not on file  Tobacco Use  . Smoking status: Former Smoker    Packs/day: 0.25    Years: 10.00    Pack years: 2.50    Types: Cigarettes    Last attempt to quit: 1997    Years since quitting: 22.1  . Smokeless tobacco: Never Used  Substance and Sexual Activity  . Alcohol use: No    Comment: Less than once a week.  . Drug use: No  . Sexual activity: Not on file  Other Topics Concern  . Not on file  Social History Narrative   Married.    Family History  Problem Relation Age of Onset  . Breast cancer Mother   . Breast cancer Sister   . Breast cancer Maternal Aunt   . Breast cancer Maternal Grandmother   . Breast cancer Maternal Aunt   . Depression Other        FMHx  . Stroke Paternal Grandfather  Review of Systems: A 12-system review of systems was performed and was negative except as noted in the HPI.  --------------------------------------------------------------------------------------------------  Physical Exam: BP 100/76   Pulse 80   Ht 5\' 6"  (1.676 m)   Wt 229 lb 12.8 oz (104.2 kg)   SpO2 98%   BMI 37.09 kg/m   General:  Obese woman, seated comfortably in the exam room. HEENT: No conjunctival pallor or scleral icterus. Moist mucous membranes.  OP clear. Neck: Supple without lymphadenopathy, thyromegaly, JVD, or HJR.  Lungs: Normal work of breathing. Clear to auscultation bilaterally without wheezes or crackles. Heart: Regular rate and rhythm without murmurs, rubs, or gallops. Well-healed median sternotomy incision. Abd: Bowel sounds present. Soft, NT/ND. Ext: No lower extremity edema. Radial, PT, and DP pulses are 2+ bilaterally. Skin: Warm and dry without rash.  Lab Results  Component Value Date   WBC 7.4 02/07/2017   HGB 9.9 (A) 02/07/2017   HCT 33 (A) 02/07/2017   MCV 80.3 01/10/2017   PLT 436 (A) 02/07/2017    Lab Results    Component Value Date   NA 142 02/07/2017   K 4.0 02/07/2017   CL 102 02/07/2017   CO2 23 02/07/2017   BUN 8 02/07/2017   CREATININE 0.63 02/07/2017   GLUCOSE 93 02/07/2017   ALT 30 01/08/2017    Lab Results  Component Value Date   CHOL 145 06/21/2012   HDL 64.60 06/21/2012   LDLCALC 73 06/21/2012   LDLDIRECT 111.7 07/08/2008   TRIG 36.0 06/21/2012   CHOLHDL 2 06/21/2012    --------------------------------------------------------------------------------------------------  ASSESSMENT AND PLAN: Left atrial myxoma s/p resection Recovering well. Heart failure symptoms related to MV obstruction from myxoma have resolved. Cleared to return to work by Dr. Prescott Gum last month. She is not participating in cardiac rehab but is trying to exercise regularly at home. Given possible thrombus and partial dehiscence of atrial septal patch noted on TEE in November, we have agreed to obtain a cardiac CTA vs. MRI for further assessment of this area now that Melissa [name] is ~4 months out from surgery. I will discuss the best imaging modality with our imaging team.  Persistent atrial fibrillation Rare palpitations but otherwise no symptoms. Review of Apple Watch tracings corresponding to palpitations shows sinus tachycardia. We will discontinue standing diltiazem and continue current dose of metoprolol. Melissa [name] will have diltiazem 30 mg on hand to take as needed for persistent palpitations. We will continue rivaroxaban given CHADSVASC of 2 (gender and prior heart failure) and concern for possible thrombus in surgical bed.  Heart failure with preserved ejection fraction Melissa [name] appears euvolemic and well compensated. I will have her take furosemide + KCl on an as needed basis if she develops edema and/or weight gain. I encouraged her to continue to minimize her sodium intake.  Follow-up: Return to clinic in 4 months.  Nelva Bush, MD 04/28/2017 8:02 PM

## 2017-04-28 NOTE — Patient Instructions (Addendum)
Medication Instructions:  Stop Diltiazem (Cardizem)  Take Furosemide (Lasix) 40 mg as needed for weight gain  Take Potassium Chloride 20 mEq as needed with Lasix  -- If you need a refill on your cardiac medications before your next appointment, please call your pharmacy. --  Labwork: None ordered  Testing/Procedures: None ordered  Follow-Up: Your physician wants you to follow-up in: 4 months with [name].    Thank you for choosing [facility]!!    Any Other Special Instructions Will Be Listed Below (If Applicable).

## 2017-04-28 NOTE — Progress Notes (Signed)
Follow-up Outpatient Visit Date: 04/28/2017  Primary Care Provider: Maurice Small, MD [address] [address]  Chief Complaint: Palpitations  HPI:  Melissa [name] is a 52 y.o. year-old female with history of left atrial myxoma status post resection complicated by postoperative atrial fibrillation, who presents for follow-up of atrial fibrillation and left atrial myxoma.  I last saw Melissa [name] in early December, at which time she was gradually recovering from her surgery.  She still noted occasional palpitations, though this was better controlled with her regimen of metoprolol and diltiazem.  We agreed to increase metoprolol to 25 mg twice daily.  We continued rivaroxaban.  Dicenso was seen by Dr. Prescott Gum about 2 weeks ago and was cleared to return to work.  Today, Melissa [name] reports feeling well with the excpetion of occasional brief palpitations. She recently purchased an [employer] and provides rhythm strips from the watch demonstrating sinus tachycardia with artifact at the time of her palpitations. Her energy continues to improve. She has some soreness and paresthesias at her sternotomy site. Otherwise, she is without chest pain as well as shortness of breath, edema, and orthopnea. She is not participating in cardiac rehab due to a substantial co-pay but is trying to walk and bike on her own. She has returned to work on a part-time basis.  --------------------------------------------------------------------------------------------------  Cardiovascular History & Procedures: Cardiovascular Problems:  Left atrial myxoma status post resection (12/2016)  Postoperative atrial fibrillation  Risk Factors:  None  Cath/PCI:  LHC/RHC (12/27/16): Minimal plaquing of the LAD and LCx. No significant CAD. RA 11, RV 71/13, PA 68/33 (47), PCWP 27. AO sat 99%, PA sat 73%. Fick CO/CI 7.9/3.7. PVR 2.5.  CV Surgery:  Resection of 6 cm left atrial myxoma and pericardial  patch of atrial septal excision (12/28/16, Dr.Van Trigt).  EP Procedures and Devices:  48-hour Holter monitor (02/08/17): Sinus rhythm with rare PACs and PVCs.  Single 4 beat atrial run.  No atrial fibrillation or other sustained arrhythmia.  Non-Invasive Evaluation(s):  TTE with bubble study (02/07/17): Normal LV size with LVEF of 55-60%.  Normal wall motion.  Mildly dilated left atrium.  No ASD or PFO (negative bubble study).  Mild TR.  Normal pulmonary artery pressure.  Normal RV size and function.  TTE (12/24/16): Normal LV size. LVEF 55-60% with normal wall motion. Severely elevated transmitral gradient (20 mmHg). Severely dilated left atrium with large heterogeneous mass partially obstructing the mitral valve. Normal RV size and function. Moderate TR. Severe pulmonary hypertension.   Recent CV Pertinent Labs: Lab Results  Component Value Date   CHOL 145 06/21/2012   HDL 64.60 06/21/2012   LDLCALC 73 06/21/2012   LDLDIRECT 111.7 07/08/2008   TRIG 36.0 06/21/2012   CHOLHDL 2 06/21/2012   INR 1.56 01/10/2017   BNP 383.4 (H) 01/08/2017   K 4.0 02/07/2017   MG 2.1 01/08/2017   BUN 8 02/07/2017   CREATININE 0.63 02/07/2017    Past medical and surgical history were reviewed and updated in EPIC.  Current Meds  Medication Sig  . acetaminophen (TYLENOL) 500 MG tablet Take 2 tablets (1,000 mg total) by mouth every 8 (eight) hours as needed.  Marland Kitchen albuterol (PROVENTIL HFA) 108 (90 Base) MCG/ACT inhaler Inhale 2 puffs into the lungs as needed.  . cetirizine (ZYRTEC) 10 MG tablet Take 10 mg by mouth daily as needed for allergies.   . Cholecalciferol (VITAMIN D) 2000 units CAPS Take 2,000 Units by mouth daily.  Marland Kitchen  diltiazem (CARDIZEM) 30 MG tablet Take 1 tablet (30 mg total) every 6 (six) hours as needed by mouth. For fast heart rate. Continue long acting diltiazem, total of no more than 360 mg per day  . Erenumab-aooe (AIMOVIG 140 DOSE [place]) Inject into the skin.  Eduard Roux  (AIMOVIG 140 DOSE) 70 MG/ML SOAJ Inject 70 mg into the skin every 30 (thirty) days.  Marland Kitchen HYDROcodone-acetaminophen (NORCO) 7.5-325 MG tablet Take 1 tablet as needed by mouth for moderate pain.  . hydrOXYzine (ATARAX/VISTARIL) 10 MG tablet Take 10 mg by mouth 3 (three) times daily as needed (migraines).  . metoprolol tartrate (LOPRESSOR) 25 MG tablet Take 1 tablet (25 mg total) by mouth 2 (two) times daily.  . Multiple Vitamin (MULTIVITAMIN) tablet Take 1 tablet by mouth 2 (two) times daily as needed.   . Multiple Vitamins-Minerals (PRESERVISION AREDS 2 PO) Take 1 tablet daily by mouth. Preservision  . ondansetron (ZOFRAN-ODT) 4 MG disintegrating tablet Take 4 mg by mouth as needed.   . pantoprazole (PROTONIX) 40 MG tablet Take 40 mg by mouth daily.   . pramipexole (MIRAPEX) 1 MG tablet Take 1 mg by mouth 2 (two) times daily as needed.  . promethazine (PHENERGAN) 12.5 MG tablet Take 1-2 tablets by mouth 3 (three) times daily as needed for nausea/vomiting.  . rivaroxaban (XARELTO) 20 MG TABS tablet Take 1 tablet (20 mg total) by mouth daily.  . SUMAtriptan 6 MG/0.5ML SOAJ Inject one at onset of severe headache; may repeat once in 2 hours if HA persists  . tiZANidine (ZANAFLEX) 2 MG tablet Take 2 mg by mouth every 6 (six) hours as needed (headache).   . [DISCONTINUED] diltiazem (CARDIZEM CD) 180 MG 24 hr capsule Take 1 capsule (180 mg total) by mouth daily.  . [DISCONTINUED] furosemide (LASIX) 40 MG tablet Take 1 tablet (40 mg total) by mouth daily.  . [DISCONTINUED] Potassium Chloride ER 20 MEQ TBCR Take 20 mEq by mouth daily.    Allergies: Other  Social History   Socioeconomic History  . Marital status: Married    Spouse name: Not on file  . Number of children: Not on file  . Years of education: Not on file  . Highest education level: Not on file  Social Needs  . Financial resource strain: Not on file  . Food insecurity - worry: Not on file  . Food insecurity - inability: Not on file  .  Transportation needs - medical: Not on file  . Transportation needs - non-medical: Not on file  Occupational History  . Not on file  Tobacco Use  . Smoking status: Former Smoker    Packs/day: 0.25    Years: 10.00    Pack years: 2.50    Types: Cigarettes    Last attempt to quit: 1997    Years since quitting: 22.1  . Smokeless tobacco: Never Used  Substance and Sexual Activity  . Alcohol use: No    Comment: Less than once a week.  . Drug use: No  . Sexual activity: Not on file  Other Topics Concern  . Not on file  Social History Narrative   Married.    Family History  Problem Relation Age of Onset  . Breast cancer Mother   . Breast cancer Sister   . Breast cancer Maternal Aunt   . Breast cancer Maternal Grandmother   . Breast cancer Maternal Aunt   . Depression Other        FMHx  . Stroke Paternal Grandfather  Review of Systems: A 12-system review of systems was performed and was negative except as noted in the HPI.  --------------------------------------------------------------------------------------------------  Physical Exam: BP 100/76   Pulse 80   Ht 5\' 6"  (1.676 m)   Wt 229 lb 12.8 oz (104.2 kg)   SpO2 98%   BMI 37.09 kg/m   General:  Obese woman, seated comfortably in the exam room. HEENT: No conjunctival pallor or scleral icterus. Moist mucous membranes.  OP clear. Neck: Supple without lymphadenopathy, thyromegaly, JVD, or HJR.  Lungs: Normal work of breathing. Clear to auscultation bilaterally without wheezes or crackles. Heart: Regular rate and rhythm without murmurs, rubs, or gallops. Well-healed median sternotomy incision. Abd: Bowel sounds present. Soft, NT/ND. Ext: No lower extremity edema. Radial, PT, and DP pulses are 2+ bilaterally. Skin: Warm and dry without rash.  Lab Results  Component Value Date   WBC 7.4 02/07/2017   HGB 9.9 (A) 02/07/2017   HCT 33 (A) 02/07/2017   MCV 80.3 01/10/2017   PLT 436 (A) 02/07/2017    Lab Results    Component Value Date   NA 142 02/07/2017   K 4.0 02/07/2017   CL 102 02/07/2017   CO2 23 02/07/2017   BUN 8 02/07/2017   CREATININE 0.63 02/07/2017   GLUCOSE 93 02/07/2017   ALT 30 01/08/2017    Lab Results  Component Value Date   CHOL 145 06/21/2012   HDL 64.60 06/21/2012   LDLCALC 73 06/21/2012   LDLDIRECT 111.7 07/08/2008   TRIG 36.0 06/21/2012   CHOLHDL 2 06/21/2012    --------------------------------------------------------------------------------------------------  ASSESSMENT AND PLAN: Left atrial myxoma s/p resection Recovering well. Heart failure symptoms related to MV obstruction from myxoma have resolved. Cleared to return to work by Dr. Prescott Gum last month. She is not participating in cardiac rehab but is trying to exercise regularly at home. Given possible thrombus and partial dehiscence of atrial septal patch noted on TEE in November, we have agreed to obtain a cardiac CTA vs. MRI for further assessment of this area now that Ms. Mcguffin is ~4 months out from surgery. I will discuss the best imaging modality with our imaging team.  Persistent atrial fibrillation Rare palpitations but otherwise no symptoms. Review of Apple Watch tracings corresponding to palpitations shows sinus tachycardia. We will discontinue standing diltiazem and continue current dose of metoprolol. Ms. Shark will have diltiazem 30 mg on hand to take as needed for persistent palpitations. We will continue rivaroxaban given CHADSVASC of 2 (gender and prior heart failure) and concern for possible thrombus in surgical bed.  Heart failure with preserved ejection fraction Ms. Barcelo appears euvolemic and well compensated. I will have her take furosemide + KCl on an as needed basis if she develops edema and/or weight gain. I encouraged her to continue to minimize her sodium intake.  Follow-up: Return to clinic in 4 months.  Nelva Bush, MD 04/28/2017 8:02 PM

## 2017-04-29 ENCOUNTER — Ambulatory Visit (HOSPITAL_COMMUNITY): Payer: 59

## 2017-04-30 ENCOUNTER — Encounter: Payer: Self-pay | Admitting: Internal Medicine

## 2017-05-02 ENCOUNTER — Ambulatory Visit (HOSPITAL_COMMUNITY): Payer: 59

## 2017-05-02 NOTE — Telephone Encounter (Signed)
Follow up     Dr Merrilyn Puma wants to know if they patient is being seen for CHF , what diagnosis is they patient being seen for ?  1) Are you calling to confirm a diagnosis or obtain personal health information (Y/N)?  yes  2) If so, what information is requested? Dr Merrilyn Puma wants to know if they patient is being seen for CHF , what diagnosis is they patient being seen for ?  Please route to Medical Records or your medical records site representative

## 2017-05-02 NOTE — Telephone Encounter (Signed)
These records have been faxed to [facility] representative [facility]

## 2017-05-04 ENCOUNTER — Ambulatory Visit (HOSPITAL_COMMUNITY): Payer: 59

## 2017-05-05 ENCOUNTER — Telehealth: Payer: Self-pay | Admitting: Internal Medicine

## 2017-05-05 NOTE — Telephone Encounter (Signed)
I spoke with Melissa [name] regarding further imaging of her intraatrial septum and site of myxoma resection and patch repair. Dr. Johnsie Cancel felt that TEE would be the test of choice. Melissa [name] is agreeable to proceeding with TEE; we will schedule this at her convenience to be performed with Dr. Johnsie Cancel.  Nelva Bush, MD [facility] Pager: [phone]

## 2017-05-05 NOTE — Telephone Encounter (Signed)
Lpmtcb 2/14 md

## 2017-05-06 ENCOUNTER — Ambulatory Visit (HOSPITAL_COMMUNITY): Payer: 59

## 2017-05-09 ENCOUNTER — Ambulatory Visit (HOSPITAL_COMMUNITY): Payer: 59

## 2017-05-10 ENCOUNTER — Other Ambulatory Visit: Payer: Self-pay | Admitting: Internal Medicine

## 2017-05-10 DIAGNOSIS — D151 Benign neoplasm of heart: Secondary | ICD-10-CM

## 2017-05-11 ENCOUNTER — Ambulatory Visit (HOSPITAL_COMMUNITY): Payer: 59

## 2017-05-13 ENCOUNTER — Ambulatory Visit (HOSPITAL_COMMUNITY): Payer: 59

## 2017-05-16 ENCOUNTER — Ambulatory Visit (HOSPITAL_COMMUNITY): Payer: 59

## 2017-05-17 ENCOUNTER — Ambulatory Visit (HOSPITAL_COMMUNITY)
Admission: RE | Admit: 2017-05-17 | Discharge: 2017-05-17 | Disposition: A | Discharge status: Home / Self Care | Payer: 59 | Source: Ambulatory Visit | Attending: Cardiovascular Disease | Admitting: Cardiovascular Disease

## 2017-05-17 ENCOUNTER — Encounter (HOSPITAL_COMMUNITY): Payer: Self-pay | Admitting: *Deleted

## 2017-05-17 ENCOUNTER — Ambulatory Visit (HOSPITAL_BASED_OUTPATIENT_CLINIC_OR_DEPARTMENT_OTHER): Payer: 59

## 2017-05-17 ENCOUNTER — Encounter (HOSPITAL_COMMUNITY)
Admission: RE | Disposition: A | Discharge status: Home / Self Care | Payer: Self-pay | Source: Ambulatory Visit | Attending: Cardiovascular Disease

## 2017-05-17 ENCOUNTER — Other Ambulatory Visit: Payer: Self-pay

## 2017-05-17 DIAGNOSIS — R002 Palpitations: Secondary | ICD-10-CM | POA: Diagnosis not present

## 2017-05-17 DIAGNOSIS — Z7901 Long term (current) use of anticoagulants: Secondary | ICD-10-CM | POA: Diagnosis not present

## 2017-05-17 DIAGNOSIS — I509 Heart failure, unspecified: Secondary | ICD-10-CM | POA: Insufficient documentation

## 2017-05-17 DIAGNOSIS — Q211 Atrial septal defect: Secondary | ICD-10-CM | POA: Diagnosis not present

## 2017-05-17 DIAGNOSIS — Z79899 Other long term (current) drug therapy: Secondary | ICD-10-CM | POA: Insufficient documentation

## 2017-05-17 DIAGNOSIS — I481 Persistent atrial fibrillation: Secondary | ICD-10-CM | POA: Insufficient documentation

## 2017-05-17 DIAGNOSIS — I088 Other rheumatic multiple valve diseases: Secondary | ICD-10-CM | POA: Diagnosis not present

## 2017-05-17 DIAGNOSIS — Z9889 Other specified postprocedural states: Secondary | ICD-10-CM | POA: Insufficient documentation

## 2017-05-17 DIAGNOSIS — I272 Pulmonary hypertension, unspecified: Secondary | ICD-10-CM | POA: Diagnosis not present

## 2017-05-17 DIAGNOSIS — Z87891 Personal history of nicotine dependence: Secondary | ICD-10-CM | POA: Insufficient documentation

## 2017-05-17 DIAGNOSIS — D151 Benign neoplasm of heart: Secondary | ICD-10-CM

## 2017-05-17 HISTORY — PX: TEE WITHOUT CARDIOVERSION: SHX5443

## 2017-05-17 SURGERY — ECHOCARDIOGRAM, TRANSESOPHAGEAL
Anesthesia: Moderate Sedation

## 2017-05-17 MED ORDER — DIPHENHYDRAMINE HCL 50 MG/ML IJ SOLN
INTRAMUSCULAR | Status: DC | PRN
  Administered 2017-05-17: 25 mg via INTRAVENOUS

## 2017-05-17 MED ORDER — FENTANYL CITRATE (PF) 100 MCG/2ML IJ SOLN
INTRAMUSCULAR | Status: AC
  Filled 2017-05-17: qty 4

## 2017-05-17 MED ORDER — MIDAZOLAM HCL 5 MG/5ML IJ SOLN
INTRAMUSCULAR | Status: DC | PRN
  Administered 2017-05-17 (×3): 2 mg via INTRAVENOUS
  Administered 2017-05-17 (×2): 1 mg via INTRAVENOUS

## 2017-05-17 MED ORDER — MIDAZOLAM HCL 5 MG/ML IJ SOLN
INTRAMUSCULAR | Status: AC
  Filled 2017-05-17: qty 3

## 2017-05-17 MED ORDER — SODIUM CHLORIDE 0.9 % IV SOLN
INTRAVENOUS | Status: DC
  Administered 2017-05-17: 08:00:00 via INTRAVENOUS

## 2017-05-17 MED ORDER — BUTAMBEN-TETRACAINE-BENZOCAINE 2-2-14 % EX AERO
INHALATION_SPRAY | CUTANEOUS | Status: DC | PRN
  Administered 2017-05-17: 2 via TOPICAL

## 2017-05-17 MED ORDER — FENTANYL CITRATE (PF) 100 MCG/2ML IJ SOLN
INTRAMUSCULAR | Status: DC | PRN
  Administered 2017-05-17 (×2): 25 ug via INTRAVENOUS

## 2017-05-17 NOTE — Interval H&P Note (Signed)
History and Physical Interval Note:  05/17/2017 7:50 AM  Melissa [name]  has presented today for surgery, with the diagnosis of EVALUATION OF INTRA ATRIAL SEPTUM STATUS POST MYXOMA RESECTION  The various methods of treatment have been discussed with the patient and family. After consideration of risks, benefits and other options for treatment, the patient has consented to  Procedure(s): TRANSESOPHAGEAL ECHOCARDIOGRAM (TEE) (N/A) as a surgical intervention .  The patient's history has been reviewed, patient examined, no change in status, stable for surgery.  I have reviewed the patient's chart and labs.  Questions were answered to the patient's satisfaction.     Jenkins Rouge

## 2017-05-17 NOTE — CV Procedure (Signed)
During this procedure the patient is administered a total of Versed 8 mg and Fentanyl 75 mg to achieve and maintain moderate conscious sedation.  The patient's heart rate, blood pressure, and oxygen saturation are monitored continuously during the procedure. The period of conscious sedation is 35 minutes, of which I was present face-to-face 100% of this time.  EF 60% Trivial MR at A3/P3 border Atrial septal patch intact negative bubble Previous area of bowing and thrombus resolved Mild TR No LAA thrombus Normal RV No effusion   Jenkins Rouge

## 2017-05-17 NOTE — Progress Notes (Signed)
  Echocardiogram Echocardiogram Transesophageal has been performed.  Jannett Celestine 05/17/2017, 9:06 AM

## 2017-05-17 NOTE — Discharge Instructions (Signed)
TEE ° °YOU HAD AN CARDIAC PROCEDURE TODAY: Refer to the procedure report and other information in the discharge instructions given to you for any specific questions about what was found during the examination. If this information does not answer your questions, please call [facility] office at [phone] to clarify.  ° °DIET: Your first meal following the procedure should be a light meal and then it is ok to progress to your normal diet. A half-sandwich or bowl of soup is an example of a good first meal. Heavy or fried foods are harder to digest and may make you feel nauseous or bloated. Drink plenty of fluids but you should avoid alcoholic beverages for 24 hours. If you had a esophageal dilation, please see attached instructions for diet.  ° °ACTIVITY: Your care partner should take you home directly after the procedure. You should plan to take it easy, moving slowly for the rest of the day. You can resume normal activity the day after the procedure however YOU SHOULD NOT DRIVE, use power tools, machinery or perform tasks that involve climbing or major physical exertion for 24 hours (because of the sedation medicines used during the test).  ° °SYMPTOMS TO REPORT IMMEDIATELY: °A cardiologist can be reached at any hour. Please call [phone] for any of the following symptoms:  °Vomiting of blood or coffee ground material  °New, significant abdominal pain  °New, significant chest pain or pain under the shoulder blades  °Painful or persistently difficult swallowing  °New shortness of breath  °Black, tarry-looking or red, bloody stools ° °FOLLOW UP:  °Please also call with any specific questions about appointments or follow up tests. ° ° °Moderate Conscious Sedation, Adult, Care After °These instructions provide you with information about caring for yourself after your procedure. Your health care provider may also give you more specific instructions. Your treatment has been planned according to current medical  practices, but problems sometimes occur. Call your health care provider if you have any problems or questions after your procedure. °What can I expect after the procedure? °After your procedure, it is common: °· To feel sleepy for several hours. °· To feel clumsy and have poor balance for several hours. °· To have poor judgment for several hours. °· To vomit if you eat too soon. ° °Follow these instructions at home: °For at least 24 hours after the procedure: ° °· Do not: °? Participate in activities where you could fall or become injured. °? Drive. °? Use heavy machinery. °? Drink alcohol. °? Take sleeping pills or medicines that cause drowsiness. °? Make important decisions or sign legal documents. °? Take care of children on your own. °· Rest. °Eating and drinking °· Follow the diet recommended by your health care provider. °· If you vomit: °? Drink water, juice, or soup when you can drink without vomiting. °? Make sure you have little or no nausea before eating solid foods. °General instructions °· Have a responsible adult stay with you until you are awake and alert. °· Take over-the-counter and prescription medicines only as told by your health care provider. °· If you smoke, do not smoke without supervision. °· Keep all follow-up visits as told by your health care provider. This is important. °Contact a health care provider if: °· You keep feeling nauseous or you keep vomiting. °· You feel light-headed. °· You develop a rash. °· You have a fever. °Get help right away if: °· You have trouble breathing. °This information is not intended to replace advice   given to you by your health care provider. Make sure you discuss any questions you have with your health care provider. °Document Released: 12/27/2012 Document Revised: 08/11/2015 Document Reviewed: 06/28/2015 °Elsevier Interactive Patient Education © 2018 [employer] ° °

## 2017-05-18 ENCOUNTER — Ambulatory Visit (HOSPITAL_COMMUNITY): Payer: 59

## 2017-05-18 DIAGNOSIS — L03113 Cellulitis of right upper limb: Secondary | ICD-10-CM | POA: Diagnosis not present

## 2017-05-19 ENCOUNTER — Encounter (HOSPITAL_COMMUNITY): Payer: Self-pay | Admitting: Cardiovascular Disease

## 2017-05-20 ENCOUNTER — Ambulatory Visit (HOSPITAL_COMMUNITY): Payer: 59

## 2017-05-23 ENCOUNTER — Ambulatory Visit (HOSPITAL_COMMUNITY): Payer: 59

## 2017-05-25 ENCOUNTER — Ambulatory Visit (HOSPITAL_COMMUNITY): Payer: 59

## 2017-05-27 ENCOUNTER — Ambulatory Visit (HOSPITAL_COMMUNITY): Payer: 59

## 2017-05-30 ENCOUNTER — Ambulatory Visit (HOSPITAL_COMMUNITY): Payer: 59

## 2017-06-01 ENCOUNTER — Ambulatory Visit (HOSPITAL_COMMUNITY): Payer: 59

## 2017-06-03 ENCOUNTER — Ambulatory Visit (HOSPITAL_COMMUNITY): Payer: 59

## 2017-06-03 DIAGNOSIS — L299 Pruritus, unspecified: Secondary | ICD-10-CM | POA: Diagnosis not present

## 2017-06-03 DIAGNOSIS — G894 Chronic pain syndrome: Secondary | ICD-10-CM | POA: Diagnosis not present

## 2017-06-06 ENCOUNTER — Ambulatory Visit (HOSPITAL_COMMUNITY): Payer: 59

## 2017-06-15 ENCOUNTER — Encounter: Payer: Self-pay | Admitting: Internal Medicine

## 2017-06-15 DIAGNOSIS — Z01818 Encounter for other preprocedural examination: Secondary | ICD-10-CM | POA: Diagnosis not present

## 2017-06-15 DIAGNOSIS — K219 Gastro-esophageal reflux disease without esophagitis: Secondary | ICD-10-CM | POA: Diagnosis not present

## 2017-06-15 DIAGNOSIS — K210 Gastro-esophageal reflux disease with esophagitis: Secondary | ICD-10-CM | POA: Diagnosis not present

## 2017-06-15 DIAGNOSIS — Z9884 Bariatric surgery status: Secondary | ICD-10-CM | POA: Diagnosis not present

## 2017-06-17 DIAGNOSIS — K912 Postsurgical malabsorption, not elsewhere classified: Secondary | ICD-10-CM | POA: Diagnosis not present

## 2017-06-17 DIAGNOSIS — Z9884 Bariatric surgery status: Secondary | ICD-10-CM | POA: Diagnosis not present

## 2017-06-21 ENCOUNTER — Telehealth: Payer: Self-pay | Admitting: Internal Medicine

## 2017-06-21 NOTE — Telephone Encounter (Signed)
Request for surgical clearance:  1. What type of surgery is being performed?  LOP partial gastrectomy with Roux en Y recontruction   2. When is this surgery scheduled?  TBD   3. Are there any medications that need to be held prior to surgery and how long? Eliquis   4. Name of physician performing surgery? [facility]   5. What is your office phone and fax number?  Phone [phone] Fax [phone] 6. Anesthesia: General

## 2017-06-22 ENCOUNTER — Telehealth: Payer: Self-pay | Admitting: Hematology and Oncology

## 2017-06-22 ENCOUNTER — Encounter: Payer: Self-pay | Admitting: Hematology and Oncology

## 2017-06-22 DIAGNOSIS — K2970 Gastritis, unspecified, without bleeding: Secondary | ICD-10-CM | POA: Diagnosis not present

## 2017-06-22 DIAGNOSIS — Z9884 Bariatric surgery status: Secondary | ICD-10-CM | POA: Diagnosis not present

## 2017-06-22 DIAGNOSIS — K219 Gastro-esophageal reflux disease without esophagitis: Secondary | ICD-10-CM | POA: Diagnosis not present

## 2017-06-22 NOTE — Telephone Encounter (Signed)
Appt has been scheduled for the pt to see Dr. Lebron Conners on 4/16 at 9am. Pt aware to arrive 30 minutes early. Letter mailed.

## 2017-06-26 NOTE — Telephone Encounter (Signed)
Patient with diagnosis of atrial fibrillation on Xarelto for anticoagulation.    Procedure: LOP Partial gastrecomy with Roux en Y reconstruction Date of procedure: TBD  CHADS2-VASc score of  2 (CHF, , female)  CrCl 168.9 Platelet count 580  Per office protocol, patient can hold     days prior to procedure.   Patient will not need bridging with Lovenox (enoxaparin) around procedure.  Patient should restart Xarelto on the evening of procedure or day after, at discretion of procedure MD

## 2017-06-27 NOTE — Telephone Encounter (Signed)
All information faxed

## 2017-06-27 NOTE — Telephone Encounter (Signed)
Last note incomplete.  Most recent platelet count down to 436  Patient can hold Xarelto for 3 days prior to procedure.   Would recommend to restart within 48 hours, at discretion of procedure MD

## 2017-06-29 NOTE — Telephone Encounter (Signed)
   Primary Cardiologist: Nelva Bush, MD  **Please route back to CV DIV PREOP**   [name]:  Please make recommendations regarding anticoagulation for surgery.  Ok to hold Xarelto 3 days as indicated by pharmacy?  Preop APP:  Need to discuss with patient if any change in clinical symptoms since last seen.     Chart reviewed as part of pre-operative protocol coverage.   She is a 51 y.o. female with a hx of L atrial myxoma s/p resection in 12/2016 and paroxysmal atrial fibrillation.  Cardiac Catheterization prior to her surgery demonstrated no significant CAD.  She last saw [name] 04/28/17.  She recently underwent follow up TEE 05/17/17 which demonstrated significant healing of the surgical bed.   I have left a message for the patient to call back so that we can assess for any changes in clinical symptoms since last seen.  Surgeon's office has a paper form that needs to be faxed back.  I will also route to [name] to weigh in regarding anticoagulation given hx of L atrial myxoma resection and possible thrombus and partial dehiscence of atrial septal patch noted on TEE in 01/2017 (although surgical bed looked ok by TEE in 2/19).     Richardson Dopp, PA-C 06/29/2017, 5:27 PM

## 2017-06-30 NOTE — Telephone Encounter (Signed)
Pt will wait and discuss with [name] in June.  He would prefer she heal more from her prior surgery first.  We faxed the clearance form back to [facility].

## 2017-07-01 ENCOUNTER — Telehealth: Payer: Self-pay | Admitting: Internal Medicine

## 2017-07-01 NOTE — Telephone Encounter (Signed)
Left message to call back  

## 2017-07-01 NOTE — Telephone Encounter (Signed)
Please have Ms. Glassner limit her salt intake and continue to take her prn furosemide until her edema improves.  If it has not gotten any better over the next 3-4 days, we should have her come in to be seen.  Nelva Bush, MD [facility] Pager: [phone]

## 2017-07-01 NOTE — Telephone Encounter (Signed)
Pt having swelling in feet and ankles last couple of days.  Took PRN Lasix yesterday with no improvement. Pt states she had this type of swelling prior to her surgery last year but this is the first time it has happened since then.  Swelling is not as bad as it was previously.  Denies SOB, no weights available.  Advised pt to monitor weights the next several days and go ahead and take another Furosemide today.  Pt going out of town today.  Advised pt I will send message to [name] and see if he has any further recommendations.  Pt appreciative for call.

## 2017-07-01 NOTE — Telephone Encounter (Signed)
Pt c/o swelling: STAT is pt has developed SOB within 24 hours  1) How much weight have you gained and in what time span? uknown  If swelling, where is the swelling located? Ankles and feet Are you currently taking a fluid pill? yes 2) Are you currently SOB? no  Do you have a log of your daily weights (if so, list)? no 3) Have you gained 3 pounds in a day or 5 pounds in a week?  Unknown   4) Have you traveled recently? no

## 2017-07-04 NOTE — Telephone Encounter (Signed)
Spoke with the pt and informed her that per Dr End, she should limit her sodium intake, continue her prn lasix, and call us back in 3-4 days if her edema has not gotten better, to be brought in to see a provider about this.  Pt verbalized understanding and agrees with this plan.

## 2017-07-05 ENCOUNTER — Encounter: Payer: Self-pay | Admitting: Hematology and Oncology

## 2017-07-05 ENCOUNTER — Telehealth: Payer: Self-pay | Admitting: Hematology and Oncology

## 2017-07-05 ENCOUNTER — Other Ambulatory Visit: Payer: Self-pay

## 2017-07-05 ENCOUNTER — Inpatient Hospital Stay: Payer: 59 | Attending: Hematology and Oncology | Admitting: Hematology and Oncology

## 2017-07-05 DIAGNOSIS — F329 Major depressive disorder, single episode, unspecified: Secondary | ICD-10-CM

## 2017-07-05 DIAGNOSIS — Z79899 Other long term (current) drug therapy: Secondary | ICD-10-CM | POA: Diagnosis not present

## 2017-07-05 DIAGNOSIS — Z803 Family history of malignant neoplasm of breast: Secondary | ICD-10-CM | POA: Diagnosis not present

## 2017-07-05 DIAGNOSIS — M797 Fibromyalgia: Secondary | ICD-10-CM | POA: Diagnosis not present

## 2017-07-05 DIAGNOSIS — Z87891 Personal history of nicotine dependence: Secondary | ICD-10-CM

## 2017-07-05 DIAGNOSIS — I5032 Chronic diastolic (congestive) heart failure: Secondary | ICD-10-CM

## 2017-07-05 DIAGNOSIS — M810 Age-related osteoporosis without current pathological fracture: Secondary | ICD-10-CM

## 2017-07-05 DIAGNOSIS — D151 Benign neoplasm of heart: Secondary | ICD-10-CM

## 2017-07-05 DIAGNOSIS — J45909 Unspecified asthma, uncomplicated: Secondary | ICD-10-CM

## 2017-07-05 DIAGNOSIS — R5383 Other fatigue: Secondary | ICD-10-CM | POA: Diagnosis not present

## 2017-07-05 DIAGNOSIS — Z9884 Bariatric surgery status: Secondary | ICD-10-CM

## 2017-07-05 DIAGNOSIS — Z7901 Long term (current) use of anticoagulants: Secondary | ICD-10-CM

## 2017-07-05 DIAGNOSIS — G8929 Other chronic pain: Secondary | ICD-10-CM

## 2017-07-05 DIAGNOSIS — I509 Heart failure, unspecified: Secondary | ICD-10-CM

## 2017-07-05 DIAGNOSIS — D509 Iron deficiency anemia, unspecified: Secondary | ICD-10-CM

## 2017-07-05 DIAGNOSIS — D508 Other iron deficiency anemias: Secondary | ICD-10-CM

## 2017-07-05 NOTE — Telephone Encounter (Signed)
Appt scheduled AVS/Calendar printed per 4/16 los °

## 2017-07-13 ENCOUNTER — Ambulatory Visit: Payer: 59 | Admitting: Cardiothoracic Surgery

## 2017-07-13 ENCOUNTER — Encounter: Payer: Self-pay | Admitting: Cardiothoracic Surgery

## 2017-07-13 ENCOUNTER — Encounter: Payer: 59 | Admitting: Cardiothoracic Surgery

## 2017-07-13 VITALS — BP 118/73 | HR 87 | Resp 16 | Ht 66.0 in | Wt 228.0 lb

## 2017-07-13 DIAGNOSIS — Z86018 Personal history of other benign neoplasm: Secondary | ICD-10-CM | POA: Diagnosis not present

## 2017-07-13 DIAGNOSIS — D509 Iron deficiency anemia, unspecified: Secondary | ICD-10-CM

## 2017-07-13 DIAGNOSIS — I5032 Chronic diastolic (congestive) heart failure: Secondary | ICD-10-CM

## 2017-07-13 DIAGNOSIS — Z09 Encounter for follow-up examination after completed treatment for conditions other than malignant neoplasm: Secondary | ICD-10-CM | POA: Diagnosis not present

## 2017-07-13 NOTE — Progress Notes (Signed)
PCP is Maurice Small, MD Referring Provider is End, Harrell Gave, MD  Chief Complaint  Patient presents with  . Routine Post Op    3 MONTH F/U.Marland KitchenMarland KitchenTEE 05/17/17    HPI scheduled 94-month follow-up after removal of a large atrial myxoma with mitral valve obstruction and symptoms of heart failure.  Patient developed atrial fibrillation and is on Xarelto and maintaining sinus rhythm for several months.  She is back to work with normal functional level and no symptoms of CHF.  Surgical incision is well-healed.  Her main complaint is related to bile reflux and potential abdominal surgery in the future as well as iron deficiency for which she  receives intravenous iron supplement Past Medical History:  Diagnosis Date  . (HFpEF) heart failure with preserved ejection fraction ([place])   . ADD (attention deficit disorder)   . Allergy   . Arthritis   . Asthma   . 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 Depression   . Migraine   . Osteoporosis   . Pulmonary hypertension ([place])   . RLS (restless legs syndrome)     Past Surgical History:  Procedure Laterality Date  . ABDOMINAL HYSTERECTOMY    . CARDIOVERSION N/A 01/10/2017   Procedure: CARDIOVERSION;  Surgeon: Dorothy Spark, MD;  Location: [facility] ENDOSCOPY;  Service: Cardiovascular;  Laterality: N/A;  . CHOLECYSTECTOMY    . EXCISION OF ATRIAL MYXOMA Left 12/28/2016   Procedure: EXCISION OF LEFT ATRIAL MYXOMA;  Surgeon: Ivin Poot, MD;  Location: [place];  Service: Open Heart Surgery;  Laterality: Left;  . FOOT SURGERY    . GASTRIC BYPASS    . INTRAOPERATIVE TRANSESOPHAGEAL ECHOCARDIOGRAM N/A 12/28/2016   Procedure: INTRAOPERATIVE TRANSESOPHAGEAL ECHOCARDIOGRAM;  Surgeon: Ivin Poot, MD;  Location: [place];  Service: Open Heart Surgery;  Laterality: N/A;  . TEE WITHOUT CARDIOVERSION N/A 01/10/2017   Procedure: TRANSESOPHAGEAL ECHOCARDIOGRAM (TEE);  Surgeon: Dorothy Spark, MD;  Location: [facility] ENDOSCOPY;  Service: Cardiovascular;  Laterality: N/A;  . TEE  WITHOUT CARDIOVERSION N/A 05/17/2017   Procedure: TRANSESOPHAGEAL ECHOCARDIOGRAM (TEE);  Surgeon: Josue Hector, MD;  Location: [facility] ENDOSCOPY;  Service: Cardiovascular;  Laterality: N/A;  . TOTAL KNEE ARTHROPLASTY    . TUBAL LIGATION      Family History  Problem Relation Age of Onset  . Breast cancer Mother   . Breast cancer Sister   . Breast cancer Maternal Aunt   . Breast cancer Maternal Grandmother   . Breast cancer Maternal Aunt   . Depression Other        FMHx  . Stroke Paternal Grandfather     Social History Social History   Tobacco Use  . Smoking status: Former Smoker    Packs/day: 0.25    Years: 10.00    Pack years: 2.50    Types: Cigarettes    Last attempt to quit: 1997    Years since quitting: 22.3  . Smokeless tobacco: Never Used  Substance Use Topics  . Alcohol use: No    Comment: Less than once a week.  . Drug use: No    Current Outpatient Medications  Medication Sig Dispense Refill  . acetaminophen (TYLENOL) 500 MG tablet Take 2 tablets (1,000 mg total) by mouth every 8 (eight) hours as needed. (Patient taking differently: Take 1,000 mg by mouth every 8 (eight) hours as needed for moderate pain or headache. ) 30 tablet 0  . albuterol (PROVENTIL HFA) 108 (90 Base) MCG/ACT inhaler Inhale 2 puffs into the lungs every 6 (six) hours as needed  for wheezing or shortness of breath.     . cetirizine (ZYRTEC) 10 MG tablet Take 10 mg by mouth daily as needed for allergies.     . Cholecalciferol (VITAMIN D3) 50000 units CAPS Take 1 capsule by mouth once a week.    . diltiazem (CARDIZEM) 30 MG tablet Take 1 tablet (30 mg total) every 6 (six) hours as needed by mouth. For fast heart rate. Continue long acting diltiazem, total of no more than 360 mg per day (Patient taking differently: Take 30 mg by mouth every 6 (six) hours as needed. For fast heart rate.) 30 tablet 0  . Erenumab-aooe (AIMOVIG 140 DOSE) 70 MG/ML SOAJ Inject 140 mg into the skin every 30 (thirty) days.      . furosemide (LASIX) 40 MG tablet As needed for weight gain (Patient taking differently: Take 40 mg by mouth daily as needed (for weight gain). ) 30 tablet 3  . HYDROcodone-acetaminophen (NORCO) 7.5-325 MG tablet Take 0.5-1 tablets by mouth every 6 (six) hours as needed for moderate pain.     . hydrOXYzine (ATARAX/VISTARIL) 10 MG tablet Take 20 mg by mouth every 8 (eight) hours as needed (migraines).     . metoprolol tartrate (LOPRESSOR) 25 MG tablet Take 1 tablet (25 mg total) by mouth 2 (two) times daily. 180 tablet 3  . Multiple Vitamin (MULTIVITAMIN) tablet Take 1 tablet by mouth 2 (two) times daily.     . Multiple Vitamins-Minerals (PRESERVISION AREDS 2 PO) Take 1 tablet by mouth 2 (two) times daily.     . naratriptan (AMERGE) 2.5 MG tablet Take 2.5 mg by mouth as needed for migraine. Take one (1) tablet at onset of headache; if returns or does not resolve, may repeat after 4 hours; do not exceed five (5) mg in 24 hours.    . ondansetron (ZOFRAN-ODT) 4 MG disintegrating tablet Take 4 mg by mouth every 8 (eight) hours as needed for nausea or vomiting.   0  . pantoprazole (PROTONIX) 40 MG tablet Take 40 mg by mouth daily.    . potassium chloride SA (K-DUR,KLOR-CON) 20 MEQ tablet As needed for weight gain with Lasix (Patient taking differently: Take 20 mEq by mouth daily as needed (for weight gain with Lasix). ) 30 tablet 3  . pramipexole (MIRAPEX) 1 MG tablet Take 1 mg by mouth 2 (two) times daily.     . promethazine (PHENERGAN) 12.5 MG tablet Take 1-2 tablets by mouth 3 (three) times daily as needed for nausea/vomiting.  5  . rivaroxaban (XARELTO) 20 MG TABS tablet Take 1 tablet (20 mg total) by mouth daily. 30 tablet 0  . SUMAtriptan 6 MG/0.5ML SOAJ Inject one at onset of severe headache; may repeat once in 2 hours if HA persists    . tiZANidine (ZANAFLEX) 2 MG tablet Take 2 mg by mouth every 6 (six) hours as needed (headache).   5   No current facility-administered medications for this visit.      Allergies  Allergen Reactions  . Other Anaphylaxis and Other (See Comments)    HONEYDEW MELON    Review of Systems  Weight stable No fever No ankle edema No syncope Positive reflux at night with emesis of bilious material Chronic iron deficiency anemia related to previous gastric bypass surgery  BP 118/73 (BP Location: Left Arm, Patient Position: Sitting, Cuff Size: Large)   Pulse 87   Resp 16   Ht 5\' 6"  (1.676 m)   Wt 228 lb (103.4 kg)   SpO2  98% Comment: ON RA  BMI 36.80 kg/m  Physical Exam      Exam    General- alert and comfortable    Neck- no JVD, no cervical adenopathy palpable, no carotid bruit   Lungs- clear without rales, wheezes   Cor- regular rate and rhythm, no murmur , gallop   Abdomen- soft, non-tender   Extremities - warm, non-tender, minimal edema   Neuro- oriented, appropriate, no focal weakness   Diagnostic Tests: No x-ray today doing well 6 months after resection of large atrial myxoma Maintaining sinus rhythm No bleeding complications from Xarelto  Impression: Return in 6 months postop for final review.  She probably will receive an echocardiogram by her cardiologist about that time. Plan: Return in 6 months.  No restrictions on activity with respect to her previous cardiac surgery  Len Childs, MD [facility] [phone]

## 2017-07-15 ENCOUNTER — Inpatient Hospital Stay: Payer: 59

## 2017-07-15 VITALS — BP 125/62 | HR 78 | Temp 98.9°F | Resp 17

## 2017-07-15 DIAGNOSIS — D508 Other iron deficiency anemias: Secondary | ICD-10-CM

## 2017-07-15 DIAGNOSIS — D509 Iron deficiency anemia, unspecified: Secondary | ICD-10-CM | POA: Diagnosis not present

## 2017-07-15 MED ORDER — SODIUM CHLORIDE 0.9 % IV SOLN
510.0000 mg | Freq: Once | INTRAVENOUS | Status: AC
  Administered 2017-07-15: 510 mg via INTRAVENOUS
  Filled 2017-07-15: qty 17

## 2017-07-15 MED ORDER — SODIUM CHLORIDE 0.9 % IV SOLN
Freq: Once | INTRAVENOUS | Status: AC
  Administered 2017-07-15: 15:00:00 via INTRAVENOUS

## 2017-07-15 NOTE — Patient Instructions (Signed)

## 2017-07-22 NOTE — Telephone Encounter (Signed)
Per Pre-Op Protocol pt needs appt in order to be cleared. Pt has appt already with Dr.End 08/2017, however pt has now been scheduled to see [name] Monday Jul 25, 2017 @ 11:20. I have not cancelled the 08/2017 appt incase [name] wanted to keep that appt as well. If not then 08/2017 appt can be cancelled at appt on Monday 5/6. Pt thanked me for the call and the sooner appt.

## 2017-07-22 NOTE — Telephone Encounter (Signed)
   Primary Cardiologist:Melissa End, MD  Chart reviewed as part of pre-operative protocol coverage. Because of Melissa [name]'s past medical history and time since last visit, he/she will require a follow-up visit in order to better assess preoperative cardiovascular risk.  She is to see [name] 09/01/17.  Pre-op covering staff: - Please notify pt of clearance after appt with [name].  - Please contact requesting surgeon's office via preferred method (i.e, phone, fax) to inform them of need for appointment prior to surgery.  Melissa Kicks, NP  07/22/2017, 3:57 PM

## 2017-07-22 NOTE — Telephone Encounter (Signed)
Follow up   Patient calling to request updated clearance be sent to [facility].

## 2017-07-24 NOTE — Assessment & Plan Note (Signed)
52 y.o. female with history of gastric bypass surgery leading to diminished absorption of certain nutrients including iron resulting in previous iron deficiency anemia.  Currently, patient is experiencing significant fatigue.  Likely due to recurrent iron depletion.  Plan: - Feraheme x2 - Return to clinic in 6 weeks with labs obtained 2 to 3 days prior

## 2017-07-24 NOTE — Progress Notes (Signed)
[facility] New Visit:  Assessment: Iron deficiency anemia 52 y.o. female with history of gastric bypass surgery leading to diminished absorption of certain nutrients including iron resulting in previous iron deficiency anemia.  Currently, patient is experiencing significant fatigue.  Likely due to recurrent iron depletion.  Plan: - Feraheme x2 - Return to clinic in 6 weeks with labs obtained 2 to 3 days prior   Voice recognition software was used and creation of this note. Despite my best effort at editing the text, some misspelling/errors may have occurred. Orders Placed This Encounter  Procedures  . CBC with Differential (Cancer Center Only)    Standing Status:   Future    Standing Expiration Date:   07/06/2018  . CMP ([facility] only)    Standing Status:   Future    Standing Expiration Date:   07/06/2018  . Iron and TIBC    Standing Status:   Future    Standing Expiration Date:   07/06/2018  . Ferritin    Standing Status:   Future    Standing Expiration Date:   07/06/2018  . Folate, Serum    Standing Status:   Future    Standing Expiration Date:   07/05/2018  . Vitamin B12    Standing Status:   Future    Standing Expiration Date:   07/05/2018  . Methylmalonic acid, serum    Standing Status:   Future    Standing Expiration Date:   07/05/2018    All questions were answered.  . The patient knows to call the clinic with any problems, questions or concerns.  This note was electronically signed.    History of Presenting Illness Ezma Chade Pitner 52 y.o. presenting to the [facility] for evaluation and management of iron deficiency anemia, referred by Dr. Maurice Small.  Patient's past medical history significant for fibromyalgia, major depressive disorder, chronic pain, atrial myxoma history, congestive heart failure, asthma, history of gastric bypass surgery followed by development of vitamin B12, vitamin D, and iron deficiency.  Patient previously has  attempted oral iron, but has been unable to tolerate it due to abdominal discomfort and constipation.  Also has received intravenous iron in the past.  At this time, patient denies any fevers chills or night sweats.  No significant weight loss.  Patient reports chronic fatigue and somnolence during the day, but denies any new abdominal discomfort, diarrhea, melena, or hematochezia.  Oncological/hematological History: --Labs, 12/13/16: Hgb 11.8, MCV 80.1, MCH 25.9, MHCH 32.3, RDW 17.2, Plt 355; Ferritin 18.2, VitB12 508' TSH 1.32   Medical History: Past Medical History:  Diagnosis Date  . (HFpEF) heart failure with preserved ejection fraction ([place])   . ADD (attention deficit disorder)   . Allergy   . Arthritis   . Asthma   . 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 Depression   . Migraine   . Osteoporosis   . Pulmonary hypertension ([place])   . RLS (restless legs syndrome)     Surgical History: Past Surgical History:  Procedure Laterality Date  . ABDOMINAL HYSTERECTOMY    . CARDIOVERSION N/A 01/10/2017   Procedure: CARDIOVERSION;  Surgeon: Dorothy Spark, MD;  Location: [facility] ENDOSCOPY;  Service: Cardiovascular;  Laterality: N/A;  . CHOLECYSTECTOMY    . EXCISION OF ATRIAL MYXOMA Left 12/28/2016   Procedure: EXCISION OF LEFT ATRIAL MYXOMA;  Surgeon: Ivin Poot, MD;  Location: [place];  Service: Open Heart Surgery;  Laterality: Left;  . FOOT SURGERY    . GASTRIC  BYPASS    . INTRAOPERATIVE TRANSESOPHAGEAL ECHOCARDIOGRAM N/A 12/28/2016   Procedure: INTRAOPERATIVE TRANSESOPHAGEAL ECHOCARDIOGRAM;  Surgeon: Ivin Poot, MD;  Location: [place];  Service: Open Heart Surgery;  Laterality: N/A;  . TEE WITHOUT CARDIOVERSION N/A 01/10/2017   Procedure: TRANSESOPHAGEAL ECHOCARDIOGRAM (TEE);  Surgeon: Dorothy Spark, MD;  Location: [facility] ENDOSCOPY;  Service: Cardiovascular;  Laterality: N/A;  . TEE WITHOUT CARDIOVERSION N/A 05/17/2017   Procedure: TRANSESOPHAGEAL ECHOCARDIOGRAM (TEE);  Surgeon: Josue Hector, MD;  Location: [facility] ENDOSCOPY;  Service: Cardiovascular;  Laterality: N/A;  . TOTAL KNEE ARTHROPLASTY    . TUBAL LIGATION      Family History: Family History  Problem Relation Age of Onset  . Breast cancer Mother   . Breast cancer Sister   . Breast cancer Maternal Aunt   . Breast cancer Maternal Grandmother   . Breast cancer Maternal Aunt   . Depression Other        FMHx  . Stroke Paternal Grandfather     Social History: Social History   Socioeconomic History  . Marital status: Married    Spouse name: Not on file  . Number of children: Not on file  . Years of education: Not on file  . Highest education level: Not on file  Occupational History  . Not on file  Social Needs  . Financial resource strain: Not on file  . Food insecurity:    Worry: Not on file    Inability: Not on file  . Transportation needs:    Medical: Not on file    Non-medical: Not on file  Tobacco Use  . Smoking status: Former Smoker    Packs/day: 0.25    Years: 10.00    Pack years: 2.50    Types: Cigarettes    Last attempt to quit: 1997    Years since quitting: 22.3  . Smokeless tobacco: Never Used  Substance and Sexual Activity  . Alcohol use: No    Comment: Less than once a week.  . Drug use: No  . Sexual activity: Not on file  Lifestyle  . Physical activity:    Days per week: Not on file    Minutes per session: Not on file  . Stress: Not on file  Relationships  . Social connections:    Talks on phone: Not on file    Gets together: Not on file    Attends religious service: Not on file    Active member of club or organization: Not on file    Attends meetings of clubs or organizations: Not on file    Relationship status: Not on file  . Intimate partner violence:    Fear of current or ex partner: Not on file    Emotionally abused: Not on file    Physically abused: Not on file    Forced sexual activity: Not on file  Other Topics Concern  . Not on file  Social History  Narrative   Married.    Allergies: Allergies  Allergen Reactions  . Other Anaphylaxis and Other (See Comments)    HONEYDEW MELON    Medications:  Current Outpatient Medications  Medication Sig Dispense Refill  . acetaminophen (TYLENOL) 500 MG tablet Take 2 tablets (1,000 mg total) by mouth every 8 (eight) hours as needed. (Patient taking differently: Take 1,000 mg by mouth every 8 (eight) hours as needed for moderate pain or headache. ) 30 tablet 0  . albuterol (PROVENTIL HFA) 108 (90 Base) MCG/ACT inhaler Inhale 2 puffs into  the lungs every 6 (six) hours as needed for wheezing or shortness of breath.     . cetirizine (ZYRTEC) 10 MG tablet Take 10 mg by mouth daily as needed for allergies.     . Cholecalciferol (VITAMIN D3) 50000 units CAPS Take 1 capsule by mouth once a week.    . diltiazem (CARDIZEM) 30 MG tablet Take 1 tablet (30 mg total) every 6 (six) hours as needed by mouth. For fast heart rate. Continue long acting diltiazem, total of no more than 360 mg per day (Patient taking differently: Take 30 mg by mouth every 6 (six) hours as needed. For fast heart rate.) 30 tablet 0  . Erenumab-aooe (AIMOVIG 140 DOSE) 70 MG/ML SOAJ Inject 140 mg into the skin every 30 (thirty) days.     . furosemide (LASIX) 40 MG tablet As needed for weight gain (Patient taking differently: Take 40 mg by mouth daily as needed (for weight gain). ) 30 tablet 3  . HYDROcodone-acetaminophen (NORCO) 7.5-325 MG tablet Take 0.5-1 tablets by mouth every 6 (six) hours as needed for moderate pain.     . hydrOXYzine (ATARAX/VISTARIL) 10 MG tablet Take 20 mg by mouth every 8 (eight) hours as needed (migraines).     . metoprolol tartrate (LOPRESSOR) 25 MG tablet Take 1 tablet (25 mg total) by mouth 2 (two) times daily. 180 tablet 3  . Multiple Vitamin (MULTIVITAMIN) tablet Take 1 tablet by mouth 2 (two) times daily.     . Multiple Vitamins-Minerals (PRESERVISION AREDS 2 PO) Take 1 tablet by mouth 2 (two) times daily.      . naratriptan (AMERGE) 2.5 MG tablet Take 2.5 mg by mouth as needed for migraine. Take one (1) tablet at onset of headache; if returns or does not resolve, may repeat after 4 hours; do not exceed five (5) mg in 24 hours.    . ondansetron (ZOFRAN-ODT) 4 MG disintegrating tablet Take 4 mg by mouth every 8 (eight) hours as needed for nausea or vomiting.   0  . pantoprazole (PROTONIX) 40 MG tablet Take 40 mg by mouth daily.    . potassium chloride SA (K-DUR,KLOR-CON) 20 MEQ tablet As needed for weight gain with Lasix (Patient taking differently: Take 20 mEq by mouth daily as needed (for weight gain with Lasix). ) 30 tablet 3  . pramipexole (MIRAPEX) 1 MG tablet Take 1 mg by mouth 2 (two) times daily.     . promethazine (PHENERGAN) 12.5 MG tablet Take 1-2 tablets by mouth 3 (three) times daily as needed for nausea/vomiting.  5  . rivaroxaban (XARELTO) 20 MG TABS tablet Take 1 tablet (20 mg total) by mouth daily. 30 tablet 0  . SUMAtriptan 6 MG/0.5ML SOAJ Inject one at onset of severe headache; may repeat once in 2 hours if HA persists    . tiZANidine (ZANAFLEX) 2 MG tablet Take 2 mg by mouth every 6 (six) hours as needed (headache).   5   No current facility-administered medications for this visit.     Review of Systems: Review of Systems  Constitutional: Positive for fatigue.  All other systems reviewed and are negative.    PHYSICAL EXAMINATION Blood pressure 130/71, pulse 80, temperature 98.3 F (36.8 C), temperature source Oral, resp. rate 19, height 5\' 6"  (1.676 m), weight 228 lb (103.4 kg), SpO2 100 %.  ECOG PERFORMANCE STATUS: 1 - Symptomatic but completely ambulatory  Physical Exam  Constitutional: She is oriented to person, place, and time. She appears well-developed and well-nourished. No distress.  HENT:  Head: Normocephalic and atraumatic.  Mouth/Throat: Oropharynx is clear and moist. No oropharyngeal exudate.  Eyes: Pupils are equal, round, and reactive to light. Conjunctivae  and EOM are normal. No scleral icterus.  Neck: No thyromegaly present.  Cardiovascular: Normal rate, regular rhythm, normal heart sounds and intact distal pulses. Exam reveals no gallop and no friction rub.  No murmur heard. Pulmonary/Chest: Breath sounds normal. No stridor. No respiratory distress. She has no wheezes. She has no rales.  Abdominal: Soft. She exhibits no distension and no mass. There is no tenderness. There is no guarding.  Musculoskeletal: She exhibits no edema.  Lymphadenopathy:    She has no cervical adenopathy.  Neurological: She is alert and oriented to person, place, and time. She displays normal reflexes. No cranial nerve deficit or sensory deficit.  Skin: Skin is warm and dry. No rash noted. She is not diaphoretic. No erythema. There is pallor.     LABORATORY DATA: I have personally reviewed the data as listed: No visits with results within 1 Week(s) from this visit.  Latest known visit with results is:  Abstract on 02/08/2017  Component Date Value Ref Range Status  . Hemoglobin 02/07/2017 9.9* 12.0 - 16.0 Final  . HCT 02/07/2017 33* 36 - 46 Final  . Neutrophils Absolute 02/07/2017 4   Final  . Platelets 02/07/2017 436* 150 - 399 Final  . WBC 02/07/2017 7.4   Final         Ardath Sax, MD

## 2017-07-25 ENCOUNTER — Encounter: Payer: Self-pay | Admitting: Internal Medicine

## 2017-07-25 ENCOUNTER — Ambulatory Visit: Payer: 59 | Admitting: Internal Medicine

## 2017-07-25 VITALS — BP 126/84 | HR 66 | Ht 67.0 in | Wt 233.8 lb

## 2017-07-25 DIAGNOSIS — I481 Persistent atrial fibrillation: Secondary | ICD-10-CM | POA: Diagnosis not present

## 2017-07-25 DIAGNOSIS — Z86018 Personal history of other benign neoplasm: Secondary | ICD-10-CM

## 2017-07-25 DIAGNOSIS — Z01810 Encounter for preprocedural cardiovascular examination: Secondary | ICD-10-CM | POA: Diagnosis not present

## 2017-07-25 DIAGNOSIS — I4819 Other persistent atrial fibrillation: Secondary | ICD-10-CM

## 2017-07-25 NOTE — Progress Notes (Signed)
Follow-up Outpatient Visit Date: 07/25/2017  Primary Care Provider: Maurice Small, MD [address] [place] [address]  Chief Complaint: Preoperative evaluation  HPI:  Ms. Hoots is a 52 y.o. year-old female with history of left atrial myxoma status post resection complicated by postoperative atrial fibrillation, who presents for follow-up of left atrial myxoma and post-op atrial fibrillation.  I last saw Ms. Sidle in February, at which time she was doing well except for occasional brief palpitations.  Subsequent TEE showed resolution of post-op thickening and possible thrombus at the site of intraatrial septum patch.  Since that time, Ms. Renninger has been struggling with severe acid/bile reflux and is planning to undergo revision of her bariatric surgery at [facility] in the near future (Dr. Beather Arbour).  Today, Ms. Lorenzo reports otherwise feeling well.  She has noticed only a few sporadic palpitations since our last visit, overall better than in the winter.  She denies chest pain, shortness of breath, orthopnea, and PND.  She had a few days of leg swelling last week that began randomly but resolved after furosemide x1 week.  She is exercising regularly without difficulty.  She remains on rivaroxaban without bleeding.  --------------------------------------------------------------------------------------------------  Cardiovascular History & Procedures: Cardiovascular Problems:  Left atrial myxoma status post resection (12/2016)  Postoperative atrial fibrillation  Risk Factors:  None  Cath/PCI:  LHC/RHC (12/27/16): Minimal plaquing of the LAD and LCx. No significant CAD. RA 11, RV 71/13, PA 68/33 (47), PCWP 27. AO sat 99%, PA sat 73%. Fick CO/CI 7.9/3.7. PVR 2.5.  CV Surgery:  Resection of 6 cm left atrial myxoma and pericardial patch of atrial septal excision (12/28/16, Dr.Van Trigt).  EP Procedures and Devices:  48-hour Holter monitor (02/08/17): Sinus rhythm  with rare PACs and PVCs. Single 4 beat atrial run. No atrial fibrillation or other sustained arrhythmia.  Non-Invasive Evaluation(s):  TTE (05/17/17): Normal LV function with LVEF 55-60%.  Normal aortic valve.  Mitral valve with mild thickening and trivial MR.  LA without evidence of myxoma or thrombus.  LA dilated.  Right atrium dilated.  Intraatrial septum with small PFO.  Patch repair intact with resolution of aneurysmal dilation and thrombus in the surgical bed. Bubble study negative for right to left shunt.  TTE with bubble study (02/07/17): Normal LV size with LVEF of 55-60%. Normal wall motion. Mildly dilated left atrium. No ASD or PFO (negative bubble study). Mild TR. Normal pulmonary artery pressure. Normal RV size and function.  TTE (12/24/16): Normal LV size. LVEF 55-60% with normal wall motion. Severely elevated transmitral gradient (20 mmHg). Severely dilated left atrium with large heterogeneous mass partially obstructing the mitral valve. Normal RV size and function. Moderate TR. Severe pulmonary hypertension.   Recent CV Pertinent Labs: Lab Results  Component Value Date   CHOL 145 06/21/2012   HDL 64.60 06/21/2012   LDLCALC 73 06/21/2012   LDLDIRECT 111.7 07/08/2008   TRIG 36.0 06/21/2012   CHOLHDL 2 06/21/2012   INR 1.56 01/10/2017   BNP 383.4 (H) 01/08/2017   K 4.0 02/07/2017   MG 2.1 01/08/2017   BUN 8 02/07/2017   CREATININE 0.63 02/07/2017    Past medical and surgical history were reviewed and updated in EPIC.  Current Meds  Medication Sig  . acetaminophen (TYLENOL) 500 MG tablet Take 1,000 mg by mouth every 8 (eight) hours as needed for mild pain or headache.  . albuterol (PROVENTIL HFA) 108 (90 Base) MCG/ACT inhaler Inhale 2 puffs into the lungs every 6 (six)  hours as needed for wheezing or shortness of breath.   . cetirizine (ZYRTEC) 10 MG tablet Take 10 mg by mouth daily as needed for allergies.   . Cholecalciferol (VITAMIN D3) 50000 units CAPS  Take 1 capsule by mouth once a week.  . diltiazem (CARDIZEM) 30 MG tablet Take 1 tablet (30 mg total) every 6 (six) hours as needed by mouth. For fast heart rate. Continue long acting diltiazem, total of no more than 360 mg per day (Patient taking differently: Take 30 mg by mouth every 6 (six) hours as needed. For fast heart rate.)  . Erenumab-aooe (AIMOVIG 140 DOSE) 70 MG/ML SOAJ Inject 140 mg into the skin every 30 (thirty) days.   . furosemide (LASIX) 40 MG tablet As needed for weight gain (Patient taking differently: Take 40 mg by mouth daily as needed (for weight gain). )  . HYDROcodone-acetaminophen (NORCO) 7.5-325 MG tablet Take 0.5-1 tablets by mouth every 6 (six) hours as needed for moderate pain.   . hydrOXYzine (ATARAX/VISTARIL) 10 MG tablet Take 20 mg by mouth every 8 (eight) hours as needed (migraines).   . metoprolol tartrate (LOPRESSOR) 25 MG tablet Take 25 mg by mouth 2 (two) times daily.  . Multiple Vitamin (MULTIVITAMIN) tablet Take 1 tablet by mouth 2 (two) times daily.   . Multiple Vitamins-Minerals (PRESERVISION AREDS 2 PO) Take 1 tablet by mouth 2 (two) times daily.   . naratriptan (AMERGE) 2.5 MG tablet Take 2.5 mg by mouth as needed for migraine. Take one (1) tablet at onset of headache; if returns or does not resolve, may repeat after 4 hours; do not exceed five (5) mg in 24 hours.  . ondansetron (ZOFRAN-ODT) 4 MG disintegrating tablet Take 4 mg by mouth every 8 (eight) hours as needed for nausea or vomiting.   . pantoprazole (PROTONIX) 40 MG tablet Take 40 mg by mouth daily.  . potassium chloride SA (K-DUR,KLOR-CON) 20 MEQ tablet As needed for weight gain with Lasix (Patient taking differently: Take 20 mEq by mouth daily as needed (for weight gain with Lasix). )  . pramipexole (MIRAPEX) 1 MG tablet Take 1 mg by mouth 2 (two) times daily.   . promethazine (PHENERGAN) 12.5 MG tablet Take 1-2 tablets by mouth 3 (three) times daily as needed for nausea/vomiting.  . rivaroxaban  (XARELTO) 20 MG TABS tablet Take 1 tablet (20 mg total) by mouth daily.  . SUMAtriptan 6 MG/0.5ML SOAJ Inject one at onset of severe headache; may repeat once in 2 hours if HA persists  . tiZANidine (ZANAFLEX) 2 MG tablet Take 2 mg by mouth every 6 (six) hours as needed (headache).     Allergies: Other  Social History   Tobacco Use  . Smoking status: Former Smoker    Packs/day: 0.25    Years: 10.00    Pack years: 2.50    Types: Cigarettes    Last attempt to quit: 1997    Years since quitting: 22.3  . Smokeless tobacco: Never Used  Substance Use Topics  . Alcohol use: No    Comment: Less than once a week.  . Drug use: No    Family History  Problem Relation Age of Onset  . Breast cancer Mother   . Breast cancer Sister   . Breast cancer Maternal Aunt   . Breast cancer Maternal Grandmother   . Breast cancer Maternal Aunt   . Depression Other        FMHx  . Stroke Paternal Grandfather  Review of Systems: A 12-system review of systems was performed and was negative except as noted in the HPI.  --------------------------------------------------------------------------------------------------  Physical Exam: BP 126/84   Pulse 66   Ht 5\' 7"  (1.702 m)   Wt 233 lb 12.8 oz (106.1 kg)   BMI 36.62 kg/m   General: NAD. HEENT: No conjunctival pallor or scleral icterus. Moist mucous membranes.  OP clear. Neck: Supple without lymphadenopathy, thyromegaly, JVD, or HJR. Lungs: Normal work of breathing. Clear to auscultation bilaterally without wheezes or crackles. Heart: Regular rate and rhythm without murmurs, rubs, or gallops. Non-displaced PMI. Abd: Bowel sounds present. Soft, NT/ND without hepatosplenomegaly Ext: No lower extremity edema. Radial, PT, and DP pulses are 2+ bilaterally. Skin: Warm and dry without rash.  EKG: Normal sinus rhythm without abnormalities.  Lab Results  Component Value Date   WBC 7.4 02/07/2017   HGB 9.9 (A) 02/07/2017   HCT 33 (A)  02/07/2017   MCV 80.3 01/10/2017   PLT 436 (A) 02/07/2017    Lab Results  Component Value Date   NA 142 02/07/2017   K 4.0 02/07/2017   CL 102 02/07/2017   CO2 23 02/07/2017   BUN 8 02/07/2017   CREATININE 0.63 02/07/2017   GLUCOSE 93 02/07/2017   ALT 30 01/08/2017    Lab Results  Component Value Date   CHOL 145 06/21/2012   HDL 64.60 06/21/2012   LDLCALC 73 06/21/2012   LDLDIRECT 111.7 07/08/2008   TRIG 36.0 06/21/2012   CHOLHDL 2 06/21/2012    --------------------------------------------------------------------------------------------------  ASSESSMENT AND PLAN: Persistent atrial fibrillation Rare palpitations reported by Ms. Coltrane but no symptoms to suggest significant recurrence of atrial fibrillation.  She remains on metoprolol tartrate and rivaroxaban she has diltiazem to take as needed for symptoms.  We will defer medication changes at this time.  I think it is reasonable for her to hold rivaroxaban 2 days prior to her upcoming surgery and restarted when felt safe to do so by her surgical team.  Left atrial myxoma status post resection Other than an episode of leg edema last month, Ms. Degan has done very well without symptoms to suggest recurrent heart failure which was her presenting clinical picture from her myxoma.  She appears euvolemic and well compensated on exam today.  Repeat TEE in the February showed resolution of aneurysmal dilation of the atrial septal patch and adjacent thrombus.  Preoperative cardiovascular risk assessment Ms. Hruska does not have any unstable cardiac symptoms.  She is able to perform greater than 4 METS of activity.  Cardiac catheterization last year showed no significant CAD.  I feel that it is reasonable for Ms. Clouatre to proceed with bariatric surgery without additional cardiac testing or intervention.  Rivaroxaban should be discontinued 2 days prior to the procedure and restarted when it is felt safe to do so by her surgical  team.  Follow-up: Return to clinic in 6 months.  Nelva Bush, MD 07/26/2017 9:53 PM

## 2017-07-25 NOTE — Patient Instructions (Addendum)
Medication Instructions:  XARELTO- HOLD 2 days prior to Surgery and then resume per Surgeon    -- If you need a refill on your cardiac medications before your next appointment, please call your pharmacy. --  Labwork: None ordered  Testing/Procedures: None ordered  Follow-Up: Your physician wants you to follow-up in: 6 months with [name].    You will receive a reminder letter in the mail two months in advance. If you don't receive a letter, please call our office to schedule the follow-up appointment.  Thank you for choosing [facility]!!    Any Other Special Instructions Will Be Listed Below (If Applicable).

## 2017-07-26 ENCOUNTER — Encounter: Payer: Self-pay | Admitting: Internal Medicine

## 2017-07-26 ENCOUNTER — Telehealth: Payer: Self-pay

## 2017-07-26 NOTE — Telephone Encounter (Signed)
   [facility] Pre-operative Risk Assessment    Request for surgical clearance:  1. What type of surgery is being performed? Laparoscopic Partial Gastrectomy w/ Reconstruction   2. When is this surgery scheduled? TBD   3. What type of clearance is required (medical clearance vs. Pharmacy clearance to hold med vs. Both)? Both  4. Are there any medications that need to be held prior to surgery and how long? Eliquis    5. Practice name and name of physician performing surgery? [facility]   6. What is your office phone number ) [phone]    7.   What is your office fax number [phone]  8.   Anesthesia type (None, local, MAC, general) ? General   Melissa [name] 07/26/2017, 2:34 PM  _________________________________________________________________   (provider comments below)

## 2017-07-29 ENCOUNTER — Encounter: Payer: Self-pay | Admitting: Internal Medicine

## 2017-07-29 ENCOUNTER — Inpatient Hospital Stay: Payer: 59 | Attending: Hematology and Oncology

## 2017-07-29 VITALS — BP 114/71 | HR 69 | Temp 98.7°F | Resp 17

## 2017-07-29 DIAGNOSIS — Z803 Family history of malignant neoplasm of breast: Secondary | ICD-10-CM | POA: Insufficient documentation

## 2017-07-29 DIAGNOSIS — M129 Arthropathy, unspecified: Secondary | ICD-10-CM | POA: Diagnosis not present

## 2017-07-29 DIAGNOSIS — F329 Major depressive disorder, single episode, unspecified: Secondary | ICD-10-CM | POA: Diagnosis not present

## 2017-07-29 DIAGNOSIS — Z79899 Other long term (current) drug therapy: Secondary | ICD-10-CM | POA: Insufficient documentation

## 2017-07-29 DIAGNOSIS — M810 Age-related osteoporosis without current pathological fracture: Secondary | ICD-10-CM | POA: Insufficient documentation

## 2017-07-29 DIAGNOSIS — G2581 Restless legs syndrome: Secondary | ICD-10-CM | POA: Diagnosis not present

## 2017-07-29 DIAGNOSIS — I5032 Chronic diastolic (congestive) heart failure: Secondary | ICD-10-CM | POA: Insufficient documentation

## 2017-07-29 DIAGNOSIS — Z7901 Long term (current) use of anticoagulants: Secondary | ICD-10-CM | POA: Diagnosis not present

## 2017-07-29 DIAGNOSIS — D509 Iron deficiency anemia, unspecified: Secondary | ICD-10-CM | POA: Insufficient documentation

## 2017-07-29 DIAGNOSIS — D508 Other iron deficiency anemias: Secondary | ICD-10-CM

## 2017-07-29 DIAGNOSIS — I272 Pulmonary hypertension, unspecified: Secondary | ICD-10-CM | POA: Diagnosis not present

## 2017-07-29 DIAGNOSIS — Z9884 Bariatric surgery status: Secondary | ICD-10-CM | POA: Insufficient documentation

## 2017-07-29 DIAGNOSIS — R400 Somnolence: Secondary | ICD-10-CM | POA: Insufficient documentation

## 2017-07-29 DIAGNOSIS — Z87891 Personal history of nicotine dependence: Secondary | ICD-10-CM | POA: Diagnosis not present

## 2017-07-29 DIAGNOSIS — R5383 Other fatigue: Secondary | ICD-10-CM | POA: Diagnosis not present

## 2017-07-29 MED ORDER — SODIUM CHLORIDE 0.9 % IV SOLN
510.0000 mg | Freq: Once | INTRAVENOUS | Status: AC
  Administered 2017-07-29: 510 mg via INTRAVENOUS
  Filled 2017-07-29: qty 17

## 2017-07-29 MED ORDER — SODIUM CHLORIDE 0.9 % IV SOLN
Freq: Once | INTRAVENOUS | Status: AC
  Administered 2017-07-29: 15:00:00 via INTRAVENOUS

## 2017-07-29 NOTE — Telephone Encounter (Signed)
   Primary Cardiologist:Christopher End, MD  Chart reviewed as part of pre-operative protocol coverage. Pre-op clearance recommendations were addressed in recent OV with [name] 07/25/17. Per his note:  Preoperative cardiovascular risk assessment Ms. Dills does not have any unstable cardiac symptoms.  She is able to perform greater than 4 METS of activity.  Cardiac catheterization last year showed no significant CAD.  I feel that it is reasonable for Ms. Eppes to proceed with bariatric surgery without additional cardiac testing or intervention.  Rivaroxaban should be discontinued 2 days prior to the procedure and restarted when it is felt safe to do so by her surgical team.   Of note, the surgical request we received asked for clearance on Eliquis - as stated above, the patient is not on Eliquis but instead Rivaroxaban (Xarelto). Will route this bundled recommendation to requesting provider via Epic fax function. Please call with questions.  It also appears this patient sent in a MyChart message via Epic about update on clearance, which was forwarded onward to nurse Legrand Como Dapp - will cc to him to make him aware clearance has been sent in so he can let pt know.  Charlie Pitter, PA-C 07/29/2017, 1:45 PM

## 2017-07-29 NOTE — Telephone Encounter (Signed)
Spoke with patient and informed her on the progress of her surgical clearance release.  She verbalized understanding.

## 2017-07-29 NOTE — Patient Instructions (Signed)

## 2017-08-02 NOTE — Progress Notes (Signed)
Clearance has been faxed to [facility]

## 2017-08-09 DIAGNOSIS — G5702 Lesion of sciatic nerve, left lower limb: Secondary | ICD-10-CM | POA: Diagnosis not present

## 2017-08-10 ENCOUNTER — Inpatient Hospital Stay: Payer: 59

## 2017-08-10 DIAGNOSIS — D508 Other iron deficiency anemias: Secondary | ICD-10-CM

## 2017-08-10 DIAGNOSIS — D509 Iron deficiency anemia, unspecified: Secondary | ICD-10-CM | POA: Diagnosis not present

## 2017-08-10 LAB — CMP (CANCER CENTER ONLY)
ALT: 25 U/L (ref 0–55)
AST: 33 U/L (ref 5–34)
Albumin: 3.9 g/dL (ref 3.5–5.0)
Alkaline Phosphatase: 90 U/L (ref 40–150)
Anion gap: 10 (ref 3–11)
BILIRUBIN TOTAL: 0.4 mg/dL (ref 0.2–1.2)
BUN: 9 mg/dL (ref 7–26)
CO2: 26 mmol/L (ref 22–29)
CREATININE: 0.71 mg/dL (ref 0.60–1.10)
Calcium: 9 mg/dL (ref 8.4–10.4)
Chloride: 108 mmol/L (ref 98–109)
GFR, Est AFR Am: >60 mL/min (ref >60)
Glucose, Bld: 87 mg/dL (ref 70–140)
Potassium: 3.2 mmol/L — ABNORMAL LOW (ref 3.5–5.1)
Sodium: 144 mmol/L (ref 136–145)
TOTAL PROTEIN: 7 g/dL (ref 6.4–8.3)

## 2017-08-10 LAB — CBC WITH DIFFERENTIAL (CANCER CENTER ONLY)
Basophils Absolute: 0.1 10*3/uL (ref 0.0–0.1)
Basophils Relative: 1 %
EOS PCT: 2 %
Eosinophils Absolute: 0.2 10*3/uL (ref 0.0–0.5)
HEMATOCRIT: 36.1 % (ref 34.8–46.6)
Hemoglobin: 11 g/dL — ABNORMAL LOW (ref 11.6–15.9)
LYMPHS ABS: 3.7 10*3/uL — AB (ref 0.9–3.3)
Lymphocytes Relative: 41 %
MCH: 23.6 pg — AB (ref 25.1–34.0)
MCHC: 30.5 g/dL — ABNORMAL LOW (ref 31.5–36.0)
MCV: 77.3 fL — AB (ref 79.5–101.0)
MONO ABS: 0.6 10*3/uL (ref 0.1–0.9)
Monocytes Relative: 7 %
Neutro Abs: 4.5 10*3/uL (ref 1.5–6.5)
Neutrophils Relative %: 49 %
PLATELETS: 302 10*3/uL (ref 145–400)
RBC: 4.67 MIL/uL (ref 3.70–5.45)
RDW: 26.9 % — AB (ref 11.2–14.5)
WBC: 9 10*3/uL (ref 3.9–10.3)

## 2017-08-10 LAB — FOLATE: FOLATE: 9.4 ng/mL (ref >5.9)

## 2017-08-10 LAB — IRON AND TIBC
Iron: 69 ug/dL (ref 41–142)
Saturation Ratios: 17 % — ABNORMAL LOW (ref 21–57)
TIBC: 418 ug/dL (ref 236–444)
UIBC: 348 ug/dL

## 2017-08-10 LAB — FERRITIN: FERRITIN: 191 ng/mL (ref 9–269)

## 2017-08-10 LAB — VITAMIN B12: VITAMIN B 12: 1716 pg/mL — AB (ref 180–914)

## 2017-08-12 DIAGNOSIS — E611 Iron deficiency: Secondary | ICD-10-CM | POA: Diagnosis not present

## 2017-08-12 DIAGNOSIS — K210 Gastro-esophageal reflux disease with esophagitis: Secondary | ICD-10-CM | POA: Diagnosis not present

## 2017-08-12 DIAGNOSIS — R7989 Other specified abnormal findings of blood chemistry: Secondary | ICD-10-CM | POA: Diagnosis not present

## 2017-08-12 DIAGNOSIS — Z9884 Bariatric surgery status: Secondary | ICD-10-CM | POA: Diagnosis not present

## 2017-08-12 DIAGNOSIS — Z01818 Encounter for other preprocedural examination: Secondary | ICD-10-CM | POA: Diagnosis not present

## 2017-08-13 LAB — METHYLMALONIC ACID, SERUM: Methylmalonic Acid, Quantitative: 157 nmol/L (ref 0–378)

## 2017-08-16 ENCOUNTER — Inpatient Hospital Stay: Payer: 59

## 2017-08-16 ENCOUNTER — Telehealth: Payer: Self-pay | Admitting: Hematology and Oncology

## 2017-08-16 ENCOUNTER — Inpatient Hospital Stay (HOSPITAL_BASED_OUTPATIENT_CLINIC_OR_DEPARTMENT_OTHER): Payer: 59 | Admitting: Hematology and Oncology

## 2017-08-16 VITALS — BP 116/64 | HR 76 | Temp 98.4°F | Resp 18 | Ht 67.0 in | Wt 233.2 lb

## 2017-08-16 DIAGNOSIS — D508 Other iron deficiency anemias: Secondary | ICD-10-CM

## 2017-08-16 DIAGNOSIS — Z7901 Long term (current) use of anticoagulants: Secondary | ICD-10-CM

## 2017-08-16 DIAGNOSIS — R400 Somnolence: Secondary | ICD-10-CM | POA: Diagnosis not present

## 2017-08-16 DIAGNOSIS — R5383 Other fatigue: Secondary | ICD-10-CM

## 2017-08-16 DIAGNOSIS — D509 Iron deficiency anemia, unspecified: Secondary | ICD-10-CM | POA: Diagnosis not present

## 2017-08-16 DIAGNOSIS — Z79899 Other long term (current) drug therapy: Secondary | ICD-10-CM | POA: Diagnosis not present

## 2017-08-16 DIAGNOSIS — G2581 Restless legs syndrome: Secondary | ICD-10-CM

## 2017-08-16 DIAGNOSIS — M129 Arthropathy, unspecified: Secondary | ICD-10-CM

## 2017-08-16 DIAGNOSIS — F329 Major depressive disorder, single episode, unspecified: Secondary | ICD-10-CM

## 2017-08-16 DIAGNOSIS — I272 Pulmonary hypertension, unspecified: Secondary | ICD-10-CM

## 2017-08-16 DIAGNOSIS — Z87891 Personal history of nicotine dependence: Secondary | ICD-10-CM

## 2017-08-16 DIAGNOSIS — Z803 Family history of malignant neoplasm of breast: Secondary | ICD-10-CM

## 2017-08-16 DIAGNOSIS — I5032 Chronic diastolic (congestive) heart failure: Secondary | ICD-10-CM

## 2017-08-16 DIAGNOSIS — Z810 Family history of intellectual disabilities: Secondary | ICD-10-CM

## 2017-08-16 DIAGNOSIS — Z9884 Bariatric surgery status: Secondary | ICD-10-CM

## 2017-08-16 NOTE — Telephone Encounter (Signed)
Appointments scheduled AVS/Calendar printed per 5/28 los °

## 2017-08-22 ENCOUNTER — Encounter: Payer: Self-pay | Admitting: Hematology and Oncology

## 2017-08-22 NOTE — Assessment & Plan Note (Signed)
52 y.o. female with history of gastric bypass surgery leading to diminished absorption of certain nutrients including iron resulting in previous iron deficiency anemia.  Currently, patient is experiencing significant fatigue.  Likely due to recurrent iron depletion.  Patient received 2 doses of parenteral iron supplementation with adequate improvement in the ferritin values.  Continues to have mild microcytic hypochromic anemia, but will likely improve as the iron stores are being utilized.  Plan: - No additional Feraheme at this point in time. - Return to clinic in 3 months with labs obtained 2 to 3 days prior and possible additional parenteral iron supplementation.  Goal to keep ferritin over 50

## 2017-08-22 NOTE — Progress Notes (Signed)
Melissa Melissa [name] 52 y.o. female with history of gastric bypass surgery leading to diminished absorption of certain nutrients including iron resulting in previous iron deficiency Melissa [name].  Currently, patient is experiencing significant fatigue.  Likely due to recurrent iron depletion.  Patient received 2 doses of parenteral iron supplementation with adequate improvement in the ferritin values.  Continues to have mild microcytic hypochromic Melissa [name], but will likely improve as the iron stores are being utilized.  Plan: - No additional Feraheme at this point in time. - Return to clinic in 3 months with labs obtained 2 to 3 days prior and possible additional parenteral iron supplementation.  Goal to keep ferritin over 50   Voice recognition software was used and creation of this note. Despite my best effort at editing the text, some misspelling/errors may have occurred.  Orders Placed This Encounter  Procedures  . CBC with Differential (Cancer Center Only)    Standing Status:   Future    Standing Expiration Date:   08/17/2018  . CMP ([facility] only)    Standing Status:   Future    Standing Expiration Date:   08/17/2018  . Iron and TIBC    Standing Status:   Future    Standing Expiration Date:   08/17/2018  . Ferritin    Standing Status:   Future    Standing Expiration Date:   08/17/2018  . Folate, Serum    Standing Status:   Future    Standing Expiration Date:   08/16/2018  . Vitamin B12    Standing Status:   Future    Standing Expiration Date:   08/16/2018  . Methylmalonic acid, serum    Standing Status:   Future    Standing Expiration Date:   08/16/2018    Cancer Staging No matching staging information was found for the patient.  All questions were answered.  . The patient knows to call the clinic with any problems, questions or concerns.  This note was electronically signed.    History of Presenting  Illness Melissa Melissa [name] is a 52 y.o. female followed in the [facility] for evaluation and management of iron deficiency Melissa [name], referred by Dr. Maurice Small.  Patient's past medical history significant for fibromyalgia, major depressive disorder, chronic pain, atrial myxoma history, congestive heart failure, asthma, history of gastric bypass surgery followed by development of vitamin B12, vitamin D, and iron deficiency.  Patient previously has attempted oral iron, but has been unable to tolerate it due to abdominal discomfort and constipation.  Also has received intravenous iron in the past.  Patient returns to the clinic after receiving 2 doses of parenteral iron.  Denies any new symptoms since the last visit to the clinic.  Reports no significant change after receiving iron and no infusion reaction.  At this time, patient denies any fevers chills or night sweats.  No significant weight loss.  Patient reports chronic fatigue and somnolence during the day, but denies any new abdominal discomfort, diarrhea, melena, or hematochezia.  Oncological/hematological History: --Labs, 12/13/16: Hgb 11.8, MCV 80.1, MCH 25.9, MHCH 32.3, RDW 17.2, Plt 355;      Ferritin   18, VitB12 508, TSH 1.32 --Treatment:   --Feraheme '510mg'$  IV x1, 07/15/17  --Feraheme '510mg'$  IV x1, 07/29/17 --Labs, 08/10/17: Hgb 11.0, MCV 77.3, MCH 23.6, MCHC 30.5, RDW 26.9, Plt 302; Fe 69, FeSat 17%, TIBC 418,  Ferritin 191, VitB12 1716, Folate 9.4;    No history exists.  Medical History: Past Medical History:  Diagnosis Date  . (HFpEF) heart failure with preserved ejection fraction ([place])   . ADD (attention deficit disorder)   . Allergy   . Arthritis   . Asthma   . 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 Depression   . Migraine   . Osteoporosis   . Pulmonary hypertension ([place])   . RLS (restless legs syndrome)     Surgical History: Past Surgical History:  Procedure Laterality Date  . ABDOMINAL HYSTERECTOMY    . CARDIOVERSION N/A  01/10/2017   Procedure: CARDIOVERSION;  Surgeon: Dorothy Spark, MD;  Location: [facility] ENDOSCOPY;  Service: Cardiovascular;  Laterality: N/A;  . CHOLECYSTECTOMY    . EXCISION OF ATRIAL MYXOMA Left 12/28/2016   Procedure: EXCISION OF LEFT ATRIAL MYXOMA;  Surgeon: Ivin Poot, MD;  Location: [facility];  Service: Open Heart Surgery;  Laterality: Left;  . FOOT SURGERY    . GASTRIC BYPASS    . INTRAOPERATIVE TRANSESOPHAGEAL ECHOCARDIOGRAM N/A 12/28/2016   Procedure: INTRAOPERATIVE TRANSESOPHAGEAL ECHOCARDIOGRAM;  Surgeon: Ivin Poot, MD;  Location: [facility];  Service: Open Heart Surgery;  Laterality: N/A;  . TEE WITHOUT CARDIOVERSION N/A 01/10/2017   Procedure: TRANSESOPHAGEAL ECHOCARDIOGRAM (TEE);  Surgeon: Dorothy Spark, MD;  Location[facility] ENDOSCOPY;  Service: Cardiovascular;  Laterality: N/A;  . TEE WITHOUT CARDIOVERSION N/A 05/17/2017   Procedure: TRANSESOPHAGEAL ECHOCARDIOGRAM (TEE);  Surgeon: Josue Hector, MD;  Location: [facility] ENDOSCOPY;  Service: Cardiovascular;  Laterality: N/A;  . TOTAL KNEE ARTHROPLASTY    . TUBAL LIGATION      Family History: Family History  Problem Relation Age of Onset  . Breast cancer Mother   . Breast cancer Sister   . Breast cancer Maternal Aunt   . Breast cancer Maternal Grandmother   . Breast cancer Maternal Aunt   . Depression Other        FMHx  . Stroke Paternal Grandfather     Social History: Social History   Socioeconomic History  . Marital status: Married    Spouse name: Not on file  . Number of children: Not on file  . Years of education: Not on file  . Highest education level: Not on file  Occupational History  . Not on file  Social Needs  . Financial resource strain: Not on file  . Food insecurity:    Worry: Not on file    Inability: Not on file  . Transportation needs:    Medical: Not on file    Non-medical: Not on file  Tobacco Use  . Smoking status: Former Smoker    Packs/day: 0.25    Years: 10.00    Pack years: 2.50     Types: Cigarettes    Last attempt to quit: 1997    Years since quitting: 22.4  . Smokeless tobacco: Never Used  Substance and Sexual Activity  . Alcohol use: No    Comment: Less than once a week.  . Drug use: No  . Sexual activity: Not on file  Lifestyle  . Physical activity:    Days per week: Not on file    Minutes per session: Not on file  . Stress: Not on file  Relationships  . Social connections:    Talks on phone: Not on file    Gets together: Not on file    Attends religious service: Not on file    Active member of club or organization: Not on file    Attends meetings of clubs or organizations: Not on  file    Relationship status: Not on file  . Intimate partner violence:    Fear of current or ex partner: Not on file    Emotionally abused: Not on file    Physically abused: Not on file    Forced sexual activity: Not on file  Other Topics Concern  . Not on file  Social History Narrative   Married.    Allergies: Allergies  Allergen Reactions  . Other Anaphylaxis and Other (See Comments)    HONEYDEW MELON    Medications:  Current Outpatient Medications  Medication Sig Dispense Refill  . acetaminophen (TYLENOL) 500 MG tablet Take 1,000 mg by mouth every 8 (eight) hours as needed for mild pain or headache.    . albuterol (PROVENTIL HFA) 108 (90 Base) MCG/ACT inhaler Inhale 2 puffs into the lungs every 6 (six) hours as needed for wheezing or shortness of breath.     . cetirizine (ZYRTEC) 10 MG tablet Take 10 mg by mouth daily as needed for allergies.     . Cholecalciferol (VITAMIN D3) 50000 units CAPS Take 1 capsule by mouth once a week.    . diltiazem (CARDIZEM) 30 MG tablet Take 1 tablet (30 mg total) every 6 (six) hours as needed by mouth. For fast heart rate. Continue long acting diltiazem, total of no more than 360 mg per day (Patient taking differently: Take 30 mg by mouth every 6 (six) hours as needed. For fast heart rate.) 30 tablet 0  . Erenumab-aooe  (AIMOVIG 140 DOSE) 70 MG/ML SOAJ Inject 140 mg into the skin every 30 (thirty) days.     . furosemide (LASIX) 40 MG tablet As needed for weight gain (Patient taking differently: Take 40 mg by mouth daily as needed (for weight gain). ) 30 tablet 3  . HYDROcodone-acetaminophen (NORCO) 7.5-325 MG tablet Take 0.5-1 tablets by mouth every 6 (six) hours as needed for moderate pain.     . hydrOXYzine (ATARAX/VISTARIL) 10 MG tablet Take 20 mg by mouth every 8 (eight) hours as needed (migraines).     . metoprolol tartrate (LOPRESSOR) 25 MG tablet Take 25 mg by mouth 2 (two) times daily.    . Multiple Vitamin (MULTIVITAMIN) tablet Take 1 tablet by mouth 2 (two) times daily.     . Multiple Vitamins-Minerals (PRESERVISION AREDS 2 PO) Take 1 tablet by mouth 2 (two) times daily.     . naratriptan (AMERGE) 2.5 MG tablet Take 2.5 mg by mouth as needed for migraine. Take one (1) tablet at onset of headache; if returns or does not resolve, may repeat after 4 hours; do not exceed five (5) mg in 24 hours.    . ondansetron (ZOFRAN-ODT) 4 MG disintegrating tablet Take 4 mg by mouth every 8 (eight) hours as needed for nausea or vomiting.   0  . pantoprazole (PROTONIX) 40 MG tablet Take 40 mg by mouth daily.    . potassium chloride SA (K-DUR,KLOR-CON) 20 MEQ tablet As needed for weight gain with Lasix (Patient taking differently: Take 20 mEq by mouth daily as needed (for weight gain with Lasix). ) 30 tablet 3  . pramipexole (MIRAPEX) 1 MG tablet Take 1 mg by mouth 2 (two) times daily.     . promethazine (PHENERGAN) 12.5 MG tablet Take 1-2 tablets by mouth 3 (three) times daily as needed for nausea/vomiting.  5  . rivaroxaban (XARELTO) 20 MG TABS tablet Take 1 tablet (20 mg total) by mouth daily. 30 tablet 0  . SUMAtriptan 6 MG/0.5ML SOAJ  Inject one at onset of severe headache; may repeat once in 2 hours if HA persists    . tiZANidine (ZANAFLEX) 2 MG tablet Take 2 mg by mouth every 6 (six) hours as needed (headache).   5    No current facility-administered medications for this visit.     Review of Systems: Review of Systems  Constitutional: Positive for fatigue.  All other systems reviewed and are negative.    PHYSICAL EXAMINATION Blood pressure 116/64, pulse 76, temperature 98.4 F (36.9 C), temperature source Oral, resp. rate 18, height '5\' 7"'$  (1.702 m), weight 233 lb 3.2 oz (105.8 kg), SpO2 98 %.  ECOG PERFORMANCE STATUS: 1 - Symptomatic but completely ambulatory  Physical Exam  Constitutional: She is oriented to person, place, and time. She appears well-developed and well-nourished. No distress.  HENT:  Head: Normocephalic and atraumatic.  Mouth/Throat: Oropharynx is clear and moist. No oropharyngeal exudate.  Eyes: Pupils are equal, round, and reactive to light. Conjunctivae and EOM are normal. No scleral icterus.  Neck: No thyromegaly present.  Cardiovascular: Normal rate, regular rhythm, normal heart sounds and intact distal pulses. Exam reveals no gallop and no friction rub.  No murmur heard. Pulmonary/Chest: Breath sounds normal. No stridor. No respiratory distress. She has no wheezes. She has no rales.  Abdominal: Soft. She exhibits no distension and no mass. There is no tenderness. There is no guarding.  Musculoskeletal: She exhibits no edema.  Lymphadenopathy:    She has no cervical adenopathy.  Neurological: She is alert and oriented to person, place, and time. She displays normal reflexes. No cranial nerve deficit or sensory deficit.  Skin: Skin is warm and dry. No rash noted. She is not diaphoretic. No erythema. There is pallor.     LABORATORY DATA: I have personally reviewed the data as listed: Appointment on 08/10/2017  Component Date Value Ref Range Status  . Methylmalonic Acid, Quantitative 08/10/2017 157  0 - 378 nmol/L Final  . Disclaimer: 08/10/2017 Comment   Final   Comment: (NOTE) This test was developed and its performance characteristics determined by [facility]. It  has not been cleared or approved by the Food and Drug Administration. Performed At: [facility] [place], [place] [id number] Rush Farmer MD [id number] Performed at [facility], [place] [address], [place], [place] [address]   . Vitamin B-12 08/10/2017 1,716* 180 - 914 pg/mL Final   Comment: (NOTE) This assay is not validated for testing neonatal or myeloproliferative syndrome specimens for Vitamin B12 levels. Performed at [facility], [place] [address], [place], [place] [address]   . Folate 08/10/2017 9.4  >5.9 ng/mL Final   Performed at [facility], [place] [address], [place], [place] [address]  . Ferritin 08/10/2017 191  9 - 269 ng/mL Final   Performed at [facility], [place] [address] [place], [place] [address]  . Iron 08/10/2017 69  41 - 142 ug/dL Final  . TIBC 08/10/2017 418  236 - 444 ug/dL Final  . Saturation Ratios 08/10/2017 17* 21 - 57 % Final  . UIBC 08/10/2017 348  ug/dL Final   Performed at [facility], [place] [address] [place], [place] [address]  . Sodium 08/10/2017 144  136 - 145 mmol/L Final  . Potassium 08/10/2017 3.2* 3.5 - 5.1 mmol/L Final  . Chloride 08/10/2017 108  98 - 109 mmol/L Final  . CO2 08/10/2017 26  22 - 29 mmol/L Final  . Glucose,  Bld 08/10/2017 87  70 - 140 mg/dL Final  . BUN 08/10/2017 9  7 - 26 mg/dL Final  . Creatinine 08/10/2017 0.71  0.60 - 1.10 mg/dL Final  . Calcium 08/10/2017 9.0  8.4 - 10.4 mg/dL Final  . Total Protein 08/10/2017 7.0  6.4 - 8.3 g/dL Final  . Albumin 08/10/2017 3.9  3.5 - 5.0 g/dL Final  . AST 08/10/2017 33  5 - 34 U/L Final  . ALT 08/10/2017 25  0 - 55 U/L Final  . Alkaline Phosphatase 08/10/2017 90  40 - 150 U/L Final  . Total Bilirubin 08/10/2017 0.4  0.2 - 1.2 mg/dL Final  . GFR, Est Non Af Am 08/10/2017 >60  >60 mL/min Final  . GFR, Est AFR Am 08/10/2017 >60  >60 mL/min Final   Comment: (NOTE) The eGFR has  been calculated using the CKD EPI equation. This calculation has not been validated in all clinical situations. eGFR's persistently <60 mL/min signify possible Chronic Kidney Disease.   Georgiann Hahn gap 08/10/2017 10  3 - 11 Final   Performed at [facility], [place] [address], [place], [place] [address]  . WBC Count 08/10/2017 9.0  3.9 - 10.3 K/uL Final  . RBC 08/10/2017 4.67  3.70 - 5.45 MIL/uL Final  . Hemoglobin 08/10/2017 11.0* 11.6 - 15.9 g/dL Final  . HCT 08/10/2017 36.1  34.8 - 46.6 % Final  . MCV 08/10/2017 77.3* 79.5 - 101.0 fL Final  . MCH 08/10/2017 23.6* 25.1 - 34.0 pg Final  . MCHC 08/10/2017 30.5* 31.5 - 36.0 g/dL Final  . RDW 08/10/2017 26.9* 11.2 - 14.5 % Final  . Platelet Count 08/10/2017 302  145 - 400 K/uL Final  . Neutrophils Relative % 08/10/2017 49  % Final  . Neutro Abs 08/10/2017 4.5  1.5 - 6.5 K/uL Final  . Lymphocytes Relative 08/10/2017 41  % Final  . Lymphs Abs 08/10/2017 3.7* 0.9 - 3.3 K/uL Final  . Monocytes Relative 08/10/2017 7  % Final  . Monocytes Absolute 08/10/2017 0.6  0.1 - 0.9 K/uL Final  . Eosinophils Relative 08/10/2017 2  % Final  . Eosinophils Absolute 08/10/2017 0.2  0.0 - 0.5 K/uL Final  . Basophils Relative 08/10/2017 1  % Final  . Basophils Absolute 08/10/2017 0.1  0.0 - 0.1 K/uL Final   Performed at [facility], [place] [address], [place], [place] [address]       Ardath Sax, MD

## 2017-09-01 ENCOUNTER — Ambulatory Visit: Payer: 59 | Admitting: Internal Medicine

## 2017-09-02 ENCOUNTER — Ambulatory Visit (INDEPENDENT_AMBULATORY_CARE_PROVIDER_SITE_OTHER): Payer: Self-pay | Admitting: Physical Medicine and Rehabilitation

## 2017-09-07 ENCOUNTER — Ambulatory Visit (INDEPENDENT_AMBULATORY_CARE_PROVIDER_SITE_OTHER): Payer: 59 | Admitting: Physical Medicine and Rehabilitation

## 2017-09-07 ENCOUNTER — Ambulatory Visit (INDEPENDENT_AMBULATORY_CARE_PROVIDER_SITE_OTHER): Payer: 59

## 2017-09-07 ENCOUNTER — Encounter (INDEPENDENT_AMBULATORY_CARE_PROVIDER_SITE_OTHER): Payer: Self-pay | Admitting: Physical Medicine and Rehabilitation

## 2017-09-07 DIAGNOSIS — G5702 Lesion of sciatic nerve, left lower limb: Secondary | ICD-10-CM

## 2017-09-07 DIAGNOSIS — K2100 Gastro-esophageal reflux disease with esophagitis, without bleeding: Secondary | ICD-10-CM | POA: Insufficient documentation

## 2017-09-07 MED ORDER — METHYLPREDNISOLONE ACETATE 80 MG/ML IJ SUSP
80.0000 mg | Freq: Once | INTRAMUSCULAR | Status: DC
Start: 2017-09-07 — End: 2019-02-01

## 2017-09-07 NOTE — Procedures (Signed)
Piriformis/Sciatic Nerve Block Injection - Posterior Approach with Fluoroscopic Guidance  Patient: Melissa [name]      Date of Birth: 1965-04-01 MRN: [id number] PCP: Maurice Small, MD      Visit Date: 09/07/2017   Universal Protocol:    Date/Time: 06/19/193:16 PM  Consent Given By: the patient  Position: prone  Additional Comments: Vital signs were monitored before and after the procedure. Patient was prepped and draped in the usual sterile fashion. The correct patient, procedure, and site was verified.   Injection Procedure Details:  Procedure Site One Meds Administered:  Meds ordered this encounter  Medications  . methylPREDNISolone acetate (DEPO-MEDROL) injection 80 mg     Laterality: Left  Location/Site:  Left piriformis/sciatic nerve.  Needle size: 27 G  Needle type: spinal needle  Needle Placement: Chest posterior to the inferior portion of the left sacroiliac joint   Findings:  -Contrast Used: 1 mL iohexol 180 mg iodine/mL   -Comments: There was excellent flow contrast with the muscular pattern obliquely representing flow of contrast through the piriformis muscle.  Procedure Details: Starting with a 90 degree vertical and midline orientation the fluoroscope was tilted cranially 20 to 25 degrees and the target area of the inferior most part of the SI joint on the side mentioned above was visualized.  The soft tissues overlying this target were infiltrated with 4 ml. of 1% Lidocaine without Epinephrine.  The spinal needle was inserted perpendicular to the fluoroscope table and advanced to the posterior inferior joint space and walked off inferiorly using fluoroscopic guidance.    Position was confirmed by using a 2 ml. volume of Omnipaque-240 contrast agent. After negative aspirate for gross pus or blood, the injectate was delivered to the muscle and surrounding sciatic nerve. Radiographs were obtained for documentation purposes.     Additional Comments:  The  patient tolerated the procedure well Dressing: Band-Aid    Post-procedure details: Patient was observed during the procedure. Post-procedure instructions were reviewed  Patient left the clinic in stable condition.

## 2017-09-07 NOTE — Patient Instructions (Signed)

## 2017-09-07 NOTE — Progress Notes (Signed)
 .  Numeric Pain Rating Scale and Functional Assessment Average Pain 6   In the last MONTH (on 0-10 scale) has pain interfered with the following?  1. General activity like being  able to carry out your everyday physical activities such as walking, climbing stairs, carrying groceries, or moving a chair?  Rating(4)    -Dye Allergies.  

## 2017-09-14 NOTE — Progress Notes (Signed)
Melissa [name] - 52 y.o. female MRN [id number]  Date of birth: 12/02/1965  Office Visit Note: Visit Date: 09/07/2017 PCP: Maurice Small, MD Referred by: Maurice Small, MD  Subjective: Chief Complaint  Patient presents with  . Left Hip - Pain   HPI: Melissa [name] is a 52 year old female with chronic left hip buttock and leg pain.  We have seen her on various occasions throughout the years for back and hip pain.  She has had a somewhat complicated chronic pain history.  She has been followed by a few physicians trying to help her.  She most recently has been seeing Dr. Phylliss Bob at [facility].  He did send the request to me for diagnostic and hopefully therapeutic left piriformis injection with fluoroscopic guidance.  Last time I saw the patient was in January 2019 and I did complete sacroiliac joint injection with 80% relief but it was very short-lived.   ROS Otherwise per HPI.  Assessment & Plan: Visit Diagnoses:  1. Piriformis syndrome of left side     Plan: No additional findings.   Meds & Orders:  Meds ordered this encounter  Medications  . methylPREDNISolone acetate (DEPO-MEDROL) injection 80 mg    Orders Placed This Encounter  Procedures  . Nerve Block  . XR C-ARM NO REPORT    Follow-up: Return for Dr. Phylliss Bob.   Procedures: No procedures performed  Piriformis/Sciatic Nerve Block Injection - Posterior Approach with Fluoroscopic Guidance  Patient: Melissa [name]      Date of Birth: 08-Apr-1965 MRN: [id number] PCP: Maurice Small, MD      Visit Date: 09/07/2017   Universal Protocol:    Date/Time: 06/19/193:16 PM  Consent Given By: the patient  Position: prone  Additional Comments: Vital signs were monitored before and after the procedure. Patient was prepped and draped in the usual sterile fashion. The correct patient, procedure, and site was verified.   Injection Procedure Details:  Procedure Site One Meds Administered:  Meds ordered this  encounter  Medications  . methylPREDNISolone acetate (DEPO-MEDROL) injection 80 mg     Laterality: Left  Location/Site:  Left piriformis/sciatic nerve.  Needle size: 34 G  Needle type: spinal needle  Needle Placement: Chest posterior to the inferior portion of the left sacroiliac joint   Findings:  -Contrast Used: 1 mL iohexol 180 mg iodine/mL   -Comments: There was excellent flow contrast with the muscular pattern obliquely representing flow of contrast through the piriformis muscle.  Procedure Details: Starting with a 90 degree vertical and midline orientation the fluoroscope was tilted cranially 20 to 25 degrees and the target area of the inferior most part of the SI joint on the side mentioned above was visualized.  The soft tissues overlying this target were infiltrated with 4 ml. of 1% Lidocaine without Epinephrine.  The spinal needle was inserted perpendicular to the fluoroscope table and advanced to the posterior inferior joint space and walked off inferiorly using fluoroscopic guidance.    Position was confirmed by using a 2 ml. volume of Omnipaque-240 contrast agent. After negative aspirate for gross pus or blood, the injectate was delivered to the muscle and surrounding sciatic nerve. Radiographs were obtained for documentation purposes.     Additional Comments:  The patient tolerated the procedure well Dressing: Band-Aid    Post-procedure details: Patient was observed during the procedure. Post-procedure instructions were reviewed  Patient left the clinic in stable condition.    Clinical History: MRI LUMBAR SPINE WITHOUT CONTRAST  TECHNIQUE: Multiplanar,  multisequence MR imaging of the lumbar spine was performed. No intravenous contrast was administered.  COMPARISON:  11/26/2010  FINDINGS: Mild curvature convex to the right with the apex at L3-4. Chronic and benign hemangioma within the anterior T12 vertebral body as seen previously, with smaller  hemangiomas within the L2 and L3 vertebral bodies.  T11-12, T12-L1 and L1-2: Normal interspace is. The distal cord and conus are normal with conus tip at L1-2.  L2-3:  Minimal desiccation and bulging of the disc.  No stenosis.  L3-4:  Mild desiccation and bulging of the disc.  No stenosis.  L4-5: Bilateral facet degeneration and hypertrophy. Anterolisthesis of 2 mm. Mild bulging of the disc. Mild narrowing of the lateral recesses but no neural compression. Edema of the facet joint on the left.  L5-S1: Endplate osteophytes and shallow protrusion of disc material slightly more prominent towards the right. Slight indentation of the thecal sac to the right of midline but no apparent neural compression. Mild facet degeneration without slippage or edema.  S1-2:  Transitional and unremarkable.  IMPRESSION: S1 is a transitional vertebra.  L4-5: Facet degeneration and hypertrophy. 2 mm of anterolisthesis. Mild bulging of the disc. No compressive stenosis. Mild facet edema left more than right could be associated with back pain or referred facet syndrome pain.  L5-S1: Endplate osteophytes and shallow disc protrusion more towards the right. Slight indentation of the thecal sac but no apparent neural compression.  Compared to the previous study, no change is appreciated   Electronically Signed   By: Nelson Chimes M.D.   On: 06/13/2015 08:55   She reports that she quit smoking about 22 years ago. Her smoking use included cigarettes. She has a 2.50 pack-year smoking history. She has never used smokeless tobacco.  Recent Labs    12/25/16 0254 12/27/16 1459  HGBA1C 5.1 5.2    Objective:  VS:  HT:    WT:   BMI:     BP:   HR: bpm  TEMP: ( )  RESP:  Physical Exam  Ortho Exam Imaging: No results found.  Past Medical/Family/Surgical/Social History: Medications & Allergies reviewed per EMR, new medications updated. Patient Active Problem List   Diagnosis Date  Noted  . Iron deficiency anemia 07/05/2017  . Persistent atrial fibrillation ([place]) 02/22/2017  . Chronic diastolic heart failure ([place]) 02/22/2017  . Migraine   . Depression   . Arthritis   . Allergy   . ADD (attention deficit disorder)   . Paroxysmal atrial fibrillation ([place]) 01/31/2017  . Pulmonary hypertension, unspecified ([place]) 01/14/2017  . History of atrial myxoma   . Atrial mass 12/28/2016  . Acute diastolic heart failure ([place])   . Migraines 12/24/2016  . Left atrial mass 12/24/2016  . (HFpEF) heart failure with preserved ejection fraction ([place]) 12/24/2016  . Asthma without status asthmaticus 12/20/2016  . 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/2016  . Weight gain 10/21/2016  . BMI 38.0-38.9,adult 09/24/2016  . Dyspnea on exertion 09/24/2016  . Intractable persistent migraine aura without cerebral infarction and without status migrainosus 09/24/2016  . Stress-related physiological response affecting medical condition 09/24/2016  . Fibromyalgia 08/28/2015  . Obesity (BMI 30-39.9) 08/28/2015  . Status migrainosus 07/11/2015  . Vitamin D deficiency 03/27/2015  . Migraine without aura, intractable, without status migrainosus 03/13/2015  . H/O gastric bypass 10/16/2013  . Heartburn 10/16/2013  . Osteoporosis 10/16/2013  . Hair loss 08/28/2012  . Disordered sleep 08/28/2012  . S/P gastric bypass 06/21/2012  . Insomnia 06/21/2012  . Postoperative anemia 01/12/2012  .  MENOPAUSE-RELATED VASOMOTOR SYMPTOMS, HOT FLASHES 12/07/2007  . RLS (restless legs syndrome) 10/26/2007  . Allergic rhinitis 12/23/2006  . HEADACHE 12/23/2006  . ALLERGIC RHINITIS, SEASONAL 11/04/2006  . SYMPTOM, DYSFUNCTION, SLEEP STAGE 11/04/2006   Past Medical History:  Diagnosis Date  . (HFpEF) heart failure with preserved ejection fraction ([place])   . ADD (attention deficit disorder)   . Allergy   . Arthritis   . Asthma   . 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 Depression   . Migraine   . Osteoporosis   . Pulmonary hypertension  ([place])   . RLS (restless legs syndrome)    Family History  Problem Relation Age of Onset  . Breast cancer Mother   . Breast cancer Sister   . Breast cancer Maternal Aunt   . Breast cancer Maternal Grandmother   . Breast cancer Maternal Aunt   . Depression Other        FMHx  . Stroke Paternal Grandfather    Past Surgical History:  Procedure Laterality Date  . ABDOMINAL HYSTERECTOMY    . CARDIOVERSION N/A 01/10/2017   Procedure: CARDIOVERSION;  Surgeon: Dorothy Spark, MD;  Location: [facility] ENDOSCOPY;  Service: Cardiovascular;  Laterality: N/A;  . CHOLECYSTECTOMY    . EXCISION OF ATRIAL MYXOMA Left 12/28/2016   Procedure: EXCISION OF LEFT ATRIAL MYXOMA;  Surgeon: Ivin Poot, MD;  Location: [place];  Service: Open Heart Surgery;  Laterality: Left;  . FOOT SURGERY    . GASTRIC BYPASS    . INTRAOPERATIVE TRANSESOPHAGEAL ECHOCARDIOGRAM N/A 12/28/2016   Procedure: INTRAOPERATIVE TRANSESOPHAGEAL ECHOCARDIOGRAM;  Surgeon: Ivin Poot, MD;  Location: [place];  Service: Open Heart Surgery;  Laterality: N/A;  . TEE WITHOUT CARDIOVERSION N/A 01/10/2017   Procedure: TRANSESOPHAGEAL ECHOCARDIOGRAM (TEE);  Surgeon: Dorothy Spark, MD;  Location: [facility] ENDOSCOPY;  Service: Cardiovascular;  Laterality: N/A;  . TEE WITHOUT CARDIOVERSION N/A 05/17/2017   Procedure: TRANSESOPHAGEAL ECHOCARDIOGRAM (TEE);  Surgeon: Josue Hector, MD;  Location: [facility] ENDOSCOPY;  Service: Cardiovascular;  Laterality: N/A;  . TOTAL KNEE ARTHROPLASTY    . TUBAL LIGATION     Social History   Occupational History  . Not on file  Tobacco Use  . Smoking status: Former Smoker    Packs/day: 0.25    Years: 10.00    Pack years: 2.50    Types: Cigarettes    Last attempt to quit: 1997    Years since quitting: 22.4  . Smokeless tobacco: Never Used  Substance and Sexual Activity  . Alcohol use: No    Comment: Less than once a week.  . Drug use: No  . Sexual activity: Not on file

## 2017-09-23 ENCOUNTER — Encounter (INDEPENDENT_AMBULATORY_CARE_PROVIDER_SITE_OTHER): Payer: Self-pay | Admitting: Orthopaedic Surgery

## 2017-09-23 ENCOUNTER — Ambulatory Visit (INDEPENDENT_AMBULATORY_CARE_PROVIDER_SITE_OTHER): Payer: Self-pay

## 2017-09-23 ENCOUNTER — Ambulatory Visit (INDEPENDENT_AMBULATORY_CARE_PROVIDER_SITE_OTHER): Payer: 59 | Admitting: Orthopaedic Surgery

## 2017-09-23 ENCOUNTER — Telehealth (INDEPENDENT_AMBULATORY_CARE_PROVIDER_SITE_OTHER): Payer: Self-pay | Admitting: Orthopaedic Surgery

## 2017-09-23 VITALS — BP 115/72 | HR 73 | Ht 66.0 in | Wt 225.0 lb

## 2017-09-23 DIAGNOSIS — M25531 Pain in right wrist: Secondary | ICD-10-CM | POA: Diagnosis not present

## 2017-09-23 NOTE — Telephone Encounter (Signed)
Melissa [name] TRYING TO GET SCHEDULED
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
## 2017-09-27 DIAGNOSIS — Z01818 Encounter for other preprocedural examination: Secondary | ICD-10-CM | POA: Diagnosis not present

## 2017-09-27 DIAGNOSIS — D509 Iron deficiency anemia, unspecified: Secondary | ICD-10-CM | POA: Diagnosis not present

## 2017-09-27 DIAGNOSIS — K2950 Unspecified chronic gastritis without bleeding: Secondary | ICD-10-CM | POA: Diagnosis not present

## 2017-09-27 DIAGNOSIS — E559 Vitamin D deficiency, unspecified: Secondary | ICD-10-CM | POA: Diagnosis not present

## 2017-10-14 DIAGNOSIS — J45909 Unspecified asthma, uncomplicated: Secondary | ICD-10-CM | POA: Diagnosis not present

## 2017-10-14 DIAGNOSIS — R0600 Dyspnea, unspecified: Secondary | ICD-10-CM | POA: Diagnosis not present

## 2017-10-14 DIAGNOSIS — E559 Vitamin D deficiency, unspecified: Secondary | ICD-10-CM | POA: Diagnosis not present

## 2017-10-14 DIAGNOSIS — K210 Gastro-esophageal reflux disease with esophagitis: Secondary | ICD-10-CM | POA: Diagnosis not present

## 2017-10-19 DIAGNOSIS — R490 Dysphonia: Secondary | ICD-10-CM | POA: Diagnosis not present

## 2017-10-19 DIAGNOSIS — R05 Cough: Secondary | ICD-10-CM | POA: Diagnosis not present

## 2017-10-19 DIAGNOSIS — J370 Chronic laryngitis: Secondary | ICD-10-CM | POA: Diagnosis not present

## 2017-10-31 DIAGNOSIS — F5101 Primary insomnia: Secondary | ICD-10-CM | POA: Diagnosis not present

## 2017-10-31 DIAGNOSIS — G894 Chronic pain syndrome: Secondary | ICD-10-CM | POA: Diagnosis not present

## 2017-10-31 DIAGNOSIS — M1990 Unspecified osteoarthritis, unspecified site: Secondary | ICD-10-CM | POA: Diagnosis not present

## 2017-11-01 DIAGNOSIS — Z9884 Bariatric surgery status: Secondary | ICD-10-CM | POA: Diagnosis not present

## 2017-11-01 DIAGNOSIS — I4891 Unspecified atrial fibrillation: Secondary | ICD-10-CM | POA: Diagnosis not present

## 2017-11-01 DIAGNOSIS — Z48815 Encounter for surgical aftercare following surgery on the digestive system: Secondary | ICD-10-CM | POA: Diagnosis not present

## 2017-11-08 NOTE — Progress Notes (Signed)
[place] [facility]  [phone] [phone]  Clinic Follow up Note   Patient Care Team: Maurice Small, MD as PCP - General (Family Medicine) End, Harrell Gave, MD as PCP - Cardiology (Cardiology) 11/16/2017  CHIEF COMPLAINT: F/u on IDA  HISTORY OF PRESENT ILLNESS: (According to Dr. Lebron Conners on 08/16/2017) Melissa [name] is a 52 y.o. female followed in the [facility] for evaluation and management of iron deficiency anemia, referred by Dr. Maurice Small. Patient's past medical history significant for fibromyalgia, major depressive disorder, chronic pain, atrial myxoma history, congestive heart failure, asthma, history of gastric bypass surgery followed by development of vitamin B12, vitamin D, and iron deficiency. Patient previously has attempted oral iron, but has been unable to tolerate it due to abdominal discomfort and constipation. Also has received intravenous iron in the past.  Currently, patient is experiencing significant fatigue.  Likely due to recurrent iron depletion.  Patient received 2 doses of parenteral iron supplementation with adequate improvement in the ferritin values.  Continues to have mild microcytic hypochromic anemia, but will likely improve as the iron stores are being utilized.  PREVIOUS PLAN: - No additional Feraheme at this point in time. - Return to clinic in 3 months with labs obtained 2 to 3 days prior and possible additional parenteral iron supplementation.  Goal to keep ferritin over 50   CURRENT THERAPY : iv feraheme as needed (if ferritin<20 or low iron level)   INTERVAL HISTORY: Melissa [name] is a 52 y.o. female who is here for follow up. She used to see Dr. Lebron Conners. She is here alone. She states that she didn't feel any difference after her iron infusion. She was told that her iron absorption is low due to bile reflux. She had surgery on 2011 to correct this, and a revision last month, when she was told that her absorption is  becoming worse.   REVIEW OF SYSTEMS:   Constitutional: Denies fevers, chills or abnormal weight loss Eyes: Denies blurriness of vision Ears, nose, mouth, throat, and face: Denies mucositis or sore throat Respiratory: Denies cough, dyspnea or wheezes Cardiovascular: Denies palpitation, chest discomfort or lower extremity swelling Gastrointestinal:  Denies nausea, heartburn or change in bowel habits Skin: Denies abnormal skin rashes Lymphatics: Denies new lymphadenopathy or easy bruising Neurological:Denies numbness, tingling or new weaknesses Behavioral/Psych: Mood is stable, no new changes  All other systems were reviewed with the patient and are negative.  MEDICAL HISTORY:  Past Medical History:  Diagnosis Date  . (HFpEF) heart failure with preserved ejection fraction ([place])   . ADD (attention deficit disorder)   . Allergy   . Arthritis   . Asthma   . 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 Depression   . Migraine   . Osteoporosis   . Pulmonary hypertension ([place])   . RLS (restless legs syndrome)     SURGICAL HISTORY: Past Surgical History:  Procedure Laterality Date  . ABDOMINAL HYSTERECTOMY    . CARDIOVERSION N/A 01/10/2017   Procedure: CARDIOVERSION;  Surgeon: Dorothy Spark, MD;  Location: [facility] ENDOSCOPY;  Service: Cardiovascular;  Laterality: N/A;  . CHOLECYSTECTOMY    . EXCISION OF ATRIAL MYXOMA Left 12/28/2016   Procedure: EXCISION OF LEFT ATRIAL MYXOMA;  Surgeon: Ivin Poot, MD;  Location: [facility];  Service: Open Heart Surgery;  Laterality: Left;  . FOOT SURGERY    . GASTRIC BYPASS    . INTRAOPERATIVE TRANSESOPHAGEAL ECHOCARDIOGRAM N/A 12/28/2016   Procedure: INTRAOPERATIVE TRANSESOPHAGEAL ECHOCARDIOGRAM;  Surgeon: Ivin Poot, MD;  Location: [facility] OR;  Service: Open Heart Surgery;  Laterality: N/A;  . TEE WITHOUT CARDIOVERSION N/A 01/10/2017   Procedure: TRANSESOPHAGEAL ECHOCARDIOGRAM (TEE);  Surgeon: Dorothy Spark, MD;  Location: [facility] ENDOSCOPY;  Service:  Cardiovascular;  Laterality: N/A;  . TEE WITHOUT CARDIOVERSION N/A 05/17/2017   Procedure: TRANSESOPHAGEAL ECHOCARDIOGRAM (TEE);  Surgeon: Josue Hector, MD;  Location: [facility] ENDOSCOPY;  Service: Cardiovascular;  Laterality: N/A;  . TOTAL KNEE ARTHROPLASTY    . TUBAL LIGATION      I have reviewed the social history and family history with the patient and they are unchanged from previous note.  ALLERGIES:  is allergic to other.  MEDICATIONS:  Current Outpatient Medications  Medication Sig Dispense Refill  . acetaminophen (TYLENOL) 500 MG tablet Take 1,000 mg by mouth every 8 (eight) hours as needed for mild pain or headache.    . albuterol (PROVENTIL HFA) 108 (90 Base) MCG/ACT inhaler Inhale 2 puffs into the lungs every 6 (six) hours as needed for wheezing or shortness of breath.     . cetirizine (ZYRTEC) 10 MG tablet Take 10 mg by mouth daily as needed for allergies.     . Cholecalciferol (VITAMIN D3) 50000 units CAPS Take 1 capsule by mouth once a week.    . diltiazem (CARDIZEM) 30 MG tablet Take 1 tablet (30 mg total) every 6 (six) hours as needed by mouth. For fast heart rate. Continue long acting diltiazem, total of no more than 360 mg per day (Patient taking differently: Take 30 mg by mouth every 6 (six) hours as needed. For fast heart rate.) 30 tablet 0  . Erenumab-aooe (AIMOVIG 140 DOSE) 70 MG/ML SOAJ Inject 140 mg into the skin every 30 (thirty) days.     . furosemide (LASIX) 40 MG tablet As needed for weight gain 30 tablet 3  . HYDROcodone-acetaminophen (NORCO) 7.5-325 MG tablet Take 0.5-1 tablets by mouth every 6 (six) hours as needed for moderate pain.     . hydrOXYzine (ATARAX/VISTARIL) 10 MG tablet Take 20 mg by mouth every 8 (eight) hours as needed (migraines).     . metoprolol tartrate (LOPRESSOR) 25 MG tablet Take 25 mg by mouth 2 (two) times daily.    . Multiple Vitamin (MULTIVITAMIN) tablet Take 1 tablet by mouth 2 (two) times daily.     . Multiple Vitamins-Minerals  (PRESERVISION AREDS 2 PO) Take 1 tablet by mouth 2 (two) times daily.     . naratriptan (AMERGE) 2.5 MG tablet Take 2.5 mg by mouth as needed for migraine. Take one (1) tablet at onset of headache; if returns or does not resolve, may repeat after 4 hours; do not exceed five (5) mg in 24 hours.    . ondansetron (ZOFRAN-ODT) 4 MG disintegrating tablet Take 4 mg by mouth every 8 (eight) hours as needed for nausea or vomiting.   0  . pantoprazole (PROTONIX) 40 MG tablet Take 40 mg by mouth daily.    . potassium chloride SA (K-DUR,KLOR-CON) 20 MEQ tablet As needed for weight gain with Lasix 30 tablet 3  . pramipexole (MIRAPEX) 1 MG tablet Take 1 mg by mouth 2 (two) times daily.     . promethazine (PHENERGAN) 12.5 MG tablet Take 1-2 tablets by mouth 3 (three) times daily as needed for nausea/vomiting.  5  . rivaroxaban (XARELTO) 20 MG TABS tablet Take 1 tablet (20 mg total) by mouth daily. 30 tablet 0  . SUMAtriptan 6 MG/0.5ML SOAJ Inject one at onset of severe headache; may repeat once  in 2 hours if HA persists    . tiZANidine (ZANAFLEX) 2 MG tablet Take 2 mg by mouth every 6 (six) hours as needed (headache).   5   Current Facility-Administered Medications  Medication Dose Route Frequency Provider Last Rate Last Dose  . methylPREDNISolone acetate (DEPO-MEDROL) injection 80 mg  80 mg Other Once Magnus Sinning, MD        PHYSICAL EXAMINATION: ECOG PERFORMANCE STATUS: 0 - Asymptomatic  Vitals:   11/16/17 0823  BP: 121/79  Pulse: 82  Resp: 18  Temp: 98.4 F (36.9 C)  SpO2: 100%   Filed Weights   11/16/17 0823  Weight: 220 lb 11.2 oz (100.1 kg)    GENERAL:alert, no distress and comfortable SKIN: skin color, texture, turgor are normal, no rashes or significant lesions EYES: normal, Conjunctiva are pink and non-injected, sclera clear OROPHARYNX:no exudate, no erythema and lips, buccal mucosa, and tongue normal  NECK: supple, thyroid normal size, non-tender, without nodularity LYMPH:  no  palpable lymphadenopathy in the cervical, axillary or inguinal LUNGS: clear to auscultation and percussion with normal breathing effort HEART: regular rate & rhythm and no murmurs and no lower extremity edema ABDOMEN:abdomen soft, non-tender and normal bowel sounds Musculoskeletal:no cyanosis of digits and no clubbing  NEURO: alert & oriented x 3 with fluent speech, no focal motor/sensory deficits  LABORATORY DATA:  I have reviewed the data as listed CBC Latest Ref Rng & Units 11/14/2017 08/10/2017 02/07/2017  WBC 3.9 - 10.3 K/uL 6.6 9.0 7.4  Hemoglobin 11.6 - 15.9 g/dL 12.5 11.0(L) 9.9(A)  Hematocrit 34.8 - 46.6 % 37.4 36.1 33(A)  Platelets 145 - 400 K/uL 232 302 436(A)     CMP Latest Ref Rng & Units 11/14/2017 08/10/2017 02/07/2017  Glucose 70 - 99 mg/dL 76 87 93  BUN 6 - 20 mg/dL 8 9 8   Creatinine 0.44 - 1.00 mg/dL 0.65 0.71 0.63  Sodium 135 - 145 mmol/L 141 144 142  Potassium 3.5 - 5.1 mmol/L 3.8 3.2(L) 4.0  Chloride 98 - 111 mmol/L 105 108 102  CO2 22 - 32 mmol/L 29 26 23   Calcium 8.9 - 10.3 mg/dL 8.9 9.0 9.3  Total Protein 6.5 - 8.1 g/dL 6.9 7.0 -  Total Bilirubin 0.3 - 1.2 mg/dL 0.7 0.4 -  Alkaline Phos 38 - 126 U/L 86 90 -  AST 15 - 41 U/L 21 33 -  ALT 0 - 44 U/L 23 25 -      RADIOGRAPHIC STUDIES: I have personally reviewed the radiological images as listed and agreed with the findings in the report.   ASSESSMENT & PLAN:  Hydie Langan is a 52 y.o. female with past medical history significant for fibromyalgia, major depressive disorder, chronic pain, atrial myxoma history, congestive heart failure, asthma, history of gastric bypass surgery followed by development of vitamin B12, vitamin D, and iron deficiency.  1. Iron Deficiency Anemia, secondary to gastric by-pass surgery  -She has chronic mild anemia, she had gastric by pass surgery, and a revision again in July 2019.  Her lab work is consistent with iron deficiency.  I think her iron deficiency anemia is likely  related to poor iron absorption after the gastric surgery. -Lab reviewed, she had IV iron in April and May of this year.  She responded well to IV iron her anemia has resolved, and iron level back to normal, however she did not have significant improvement in her mild fatigue. She didn't tolerate iron pill in the past due to constipation and  discomfort.  -I will see her in 5-6 months with labs a few days ago. -Labs in 2-3 months  2. Persistent atrial fibrillation -Rare palpitations reported by Ms. Buckles but no symptoms to suggest significant recurrence of atrial fibrillation.   -She remains on metoprolol tartrate and rivaroxaban she has diltiazem to take as needed for symptoms.  [name] deferred medication changes at this time.   -Follow-up with [name] and continue rivaroxaban   3. Left atrial myxoma status post resection -Other than an episode of leg edema in April 2019, Ms. Arseneau has done very well without symptoms to suggest recurrent heart failure which was her presenting clinical picture from her myxoma.  -She appears euvolemic and well compensated on previous exam .   -[name] will repeat TEE in the February showed resolution of aneurysmal dilation of the atrial septal patch and adjacent thrombus.  4.  Family history of malignancy -She has family history of thyroid cancer in her mother, endometrial cancer in her sister, and breast cancer in 3 of her maternal relatives. -She is interested in genetic testing, I will refer her to our genetic counselors  PLAN: -f/u in 5-6 months with labs a few days before -Labs in 2-3 months -Genetic referral due to strong family history of female GU cancer and thyroid cancer. -No feraheme infusion today  All questions were answered. The patient knows to call the clinic with any problems, questions or concerns. No barriers to learning was detected. I spent 20 minutes counseling the patient face to face. The total time spent in the appointment was 25  minutes and more than 50% was on counseling and review of test results  I, Noor Dweik am acting as scribe for Dr. Truitt Merle.  I have reviewed the above documentation for accuracy and completeness, and I agree with the above.      Truitt Merle, MD 11/16/2017

## 2017-11-14 ENCOUNTER — Inpatient Hospital Stay: Payer: 59 | Attending: Hematology

## 2017-11-14 DIAGNOSIS — F329 Major depressive disorder, single episode, unspecified: Secondary | ICD-10-CM | POA: Diagnosis not present

## 2017-11-14 DIAGNOSIS — Z808 Family history of malignant neoplasm of other organs or systems: Secondary | ICD-10-CM | POA: Diagnosis not present

## 2017-11-14 DIAGNOSIS — J45909 Unspecified asthma, uncomplicated: Secondary | ICD-10-CM | POA: Diagnosis not present

## 2017-11-14 DIAGNOSIS — D151 Benign neoplasm of heart: Secondary | ICD-10-CM | POA: Diagnosis not present

## 2017-11-14 DIAGNOSIS — K5900 Constipation, unspecified: Secondary | ICD-10-CM | POA: Insufficient documentation

## 2017-11-14 DIAGNOSIS — E538 Deficiency of other specified B group vitamins: Secondary | ICD-10-CM | POA: Insufficient documentation

## 2017-11-14 DIAGNOSIS — M810 Age-related osteoporosis without current pathological fracture: Secondary | ICD-10-CM | POA: Insufficient documentation

## 2017-11-14 DIAGNOSIS — Z803 Family history of malignant neoplasm of breast: Secondary | ICD-10-CM | POA: Diagnosis not present

## 2017-11-14 DIAGNOSIS — G8929 Other chronic pain: Secondary | ICD-10-CM | POA: Diagnosis not present

## 2017-11-14 DIAGNOSIS — Z9884 Bariatric surgery status: Secondary | ICD-10-CM | POA: Insufficient documentation

## 2017-11-14 DIAGNOSIS — Z79899 Other long term (current) drug therapy: Secondary | ICD-10-CM | POA: Diagnosis not present

## 2017-11-14 DIAGNOSIS — I481 Persistent atrial fibrillation: Secondary | ICD-10-CM | POA: Insufficient documentation

## 2017-11-14 DIAGNOSIS — E559 Vitamin D deficiency, unspecified: Secondary | ICD-10-CM | POA: Insufficient documentation

## 2017-11-14 DIAGNOSIS — I5032 Chronic diastolic (congestive) heart failure: Secondary | ICD-10-CM | POA: Insufficient documentation

## 2017-11-14 DIAGNOSIS — I272 Pulmonary hypertension, unspecified: Secondary | ICD-10-CM | POA: Diagnosis not present

## 2017-11-14 DIAGNOSIS — Z7901 Long term (current) use of anticoagulants: Secondary | ICD-10-CM | POA: Diagnosis not present

## 2017-11-14 DIAGNOSIS — M797 Fibromyalgia: Secondary | ICD-10-CM | POA: Insufficient documentation

## 2017-11-14 DIAGNOSIS — D509 Iron deficiency anemia, unspecified: Secondary | ICD-10-CM | POA: Diagnosis not present

## 2017-11-14 DIAGNOSIS — D508 Other iron deficiency anemias: Secondary | ICD-10-CM

## 2017-11-14 LAB — CMP (CANCER CENTER ONLY)
ALT: 23 U/L (ref 0–44)
ANION GAP: 7 (ref 5–15)
AST: 21 U/L (ref 15–41)
Albumin: 3.7 g/dL (ref 3.5–5.0)
Alkaline Phosphatase: 86 U/L (ref 38–126)
BUN: 8 mg/dL (ref 6–20)
CHLORIDE: 105 mmol/L (ref 98–111)
CO2: 29 mmol/L (ref 22–32)
Calcium: 8.9 mg/dL (ref 8.9–10.3)
Creatinine: 0.65 mg/dL (ref 0.44–1.00)
Glucose, Bld: 76 mg/dL (ref 70–99)
POTASSIUM: 3.8 mmol/L (ref 3.5–5.1)
Sodium: 141 mmol/L (ref 135–145)
TOTAL PROTEIN: 6.9 g/dL (ref 6.5–8.1)
Total Bilirubin: 0.7 mg/dL (ref 0.3–1.2)

## 2017-11-14 LAB — CBC WITH DIFFERENTIAL (CANCER CENTER ONLY)
BASOS ABS: 0.1 10*3/uL (ref 0.0–0.1)
Basophils Relative: 1 %
Eosinophils Absolute: 0.8 10*3/uL — ABNORMAL HIGH (ref 0.0–0.5)
Eosinophils Relative: 12 %
HCT: 37.4 % (ref 34.8–46.6)
Hemoglobin: 12.5 g/dL (ref 11.6–15.9)
LYMPHS PCT: 25 %
Lymphs Abs: 1.6 10*3/uL (ref 0.9–3.3)
MCH: 29.6 pg (ref 25.1–34.0)
MCHC: 33.5 g/dL (ref 31.5–36.0)
MCV: 88.5 fL (ref 79.5–101.0)
MONO ABS: 0.5 10*3/uL (ref 0.1–0.9)
MONOS PCT: 7 %
Neutro Abs: 3.7 10*3/uL (ref 1.5–6.5)
Neutrophils Relative %: 55 %
PLATELETS: 232 10*3/uL (ref 145–400)
RBC: 4.22 MIL/uL (ref 3.70–5.45)
RDW: 14.5 % (ref 11.2–14.5)
WBC Count: 6.6 10*3/uL (ref 3.9–10.3)

## 2017-11-14 LAB — IRON AND TIBC
IRON: 123 ug/dL (ref 41–142)
Saturation Ratios: 29 % (ref 21–57)
TIBC: 429 ug/dL (ref 236–444)
UIBC: 306 ug/dL

## 2017-11-14 LAB — FERRITIN: FERRITIN: 29 ng/mL (ref 11–307)

## 2017-11-14 LAB — VITAMIN B12: Vitamin B-12: 433 pg/mL (ref 180–914)

## 2017-11-14 LAB — FOLATE: Folate: 9 ng/mL (ref >5.9)

## 2017-11-16 ENCOUNTER — Inpatient Hospital Stay (HOSPITAL_BASED_OUTPATIENT_CLINIC_OR_DEPARTMENT_OTHER): Payer: 59 | Admitting: Hematology

## 2017-11-16 ENCOUNTER — Inpatient Hospital Stay: Payer: 59

## 2017-11-16 ENCOUNTER — Encounter: Payer: Self-pay | Admitting: Hematology

## 2017-11-16 ENCOUNTER — Telehealth: Payer: Self-pay

## 2017-11-16 VITALS — BP 121/79 | HR 82 | Temp 98.4°F | Resp 18 | Ht 66.0 in | Wt 220.7 lb

## 2017-11-16 DIAGNOSIS — F329 Major depressive disorder, single episode, unspecified: Secondary | ICD-10-CM

## 2017-11-16 DIAGNOSIS — D151 Benign neoplasm of heart: Secondary | ICD-10-CM

## 2017-11-16 DIAGNOSIS — I4891 Unspecified atrial fibrillation: Secondary | ICD-10-CM | POA: Diagnosis not present

## 2017-11-16 DIAGNOSIS — Z9884 Bariatric surgery status: Secondary | ICD-10-CM

## 2017-11-16 DIAGNOSIS — I272 Pulmonary hypertension, unspecified: Secondary | ICD-10-CM

## 2017-11-16 DIAGNOSIS — D509 Iron deficiency anemia, unspecified: Secondary | ICD-10-CM

## 2017-11-16 DIAGNOSIS — Z7901 Long term (current) use of anticoagulants: Secondary | ICD-10-CM

## 2017-11-16 DIAGNOSIS — I5032 Chronic diastolic (congestive) heart failure: Secondary | ICD-10-CM

## 2017-11-16 DIAGNOSIS — M810 Age-related osteoporosis without current pathological fracture: Secondary | ICD-10-CM

## 2017-11-16 DIAGNOSIS — K5900 Constipation, unspecified: Secondary | ICD-10-CM

## 2017-11-16 DIAGNOSIS — G8929 Other chronic pain: Secondary | ICD-10-CM

## 2017-11-16 DIAGNOSIS — Z803 Family history of malignant neoplasm of breast: Secondary | ICD-10-CM

## 2017-11-16 DIAGNOSIS — J45909 Unspecified asthma, uncomplicated: Secondary | ICD-10-CM

## 2017-11-16 DIAGNOSIS — Z808 Family history of malignant neoplasm of other organs or systems: Secondary | ICD-10-CM

## 2017-11-16 DIAGNOSIS — Z79899 Other long term (current) drug therapy: Secondary | ICD-10-CM

## 2017-11-16 DIAGNOSIS — E538 Deficiency of other specified B group vitamins: Secondary | ICD-10-CM

## 2017-11-16 DIAGNOSIS — M797 Fibromyalgia: Secondary | ICD-10-CM

## 2017-11-16 DIAGNOSIS — E559 Vitamin D deficiency, unspecified: Secondary | ICD-10-CM

## 2017-11-16 NOTE — Telephone Encounter (Signed)
Printed  avs and calender of upcoming appointment. Per 8/28 los 

## 2017-11-16 NOTE — Progress Notes (Signed)
No IV iron today per Dr. Burr Medico, notified Erasmo Downer in infusion.

## 2017-11-18 LAB — METHYLMALONIC ACID, SERUM: Methylmalonic Acid, Quantitative: 168 nmol/L (ref 0–378)

## 2017-11-23 DIAGNOSIS — G43709 Chronic migraine without aura, not intractable, without status migrainosus: Secondary | ICD-10-CM | POA: Diagnosis not present

## 2017-11-23 DIAGNOSIS — G43119 Migraine with aura, intractable, without status migrainosus: Secondary | ICD-10-CM | POA: Diagnosis not present

## 2017-11-23 DIAGNOSIS — M797 Fibromyalgia: Secondary | ICD-10-CM | POA: Diagnosis not present

## 2017-11-23 DIAGNOSIS — G43109 Migraine with aura, not intractable, without status migrainosus: Secondary | ICD-10-CM | POA: Diagnosis not present

## 2017-11-23 DIAGNOSIS — G43701 Chronic migraine without aura, not intractable, with status migrainosus: Secondary | ICD-10-CM | POA: Diagnosis not present

## 2017-11-23 DIAGNOSIS — Z79899 Other long term (current) drug therapy: Secondary | ICD-10-CM | POA: Diagnosis not present

## 2017-12-21 ENCOUNTER — Telehealth: Payer: Self-pay | Admitting: Internal Medicine

## 2017-12-21 NOTE — Telephone Encounter (Signed)
Let's have her come in and see an APP or Dr. Johnsie Cancel (whom I will have her follow with after my transition to [facility]).  If her watch confirms a-fib, she may need to resume anticoagulation in addition adjust diltiazem.  Thanks.  Nelva Bush, MD [facility] Pager: [phone]

## 2017-12-21 NOTE — Telephone Encounter (Signed)
Spoke with patient who said that she went into Afib at about 4pm today. It lasted about 3-4 minutes with a HR of 140 according to her Apple Watch.    She has not had an episode of Afib like this since last November, so this concerned her.  She was not SOB, but felt lightheaded, fullness in her chest, and felt her heart beating fast.  She has diltiazem, but has not needed to take it.  She will monitor rhythm for a few days, figuring this may have been a one time incident.

## 2017-12-21 NOTE — Telephone Encounter (Signed)
° °  Pt c/o medication issue:  1. Name of Medication: diltiazem (CARDIZEM) 30 MG tablet  2. How are you currently taking this medication (dosage and times per day)? n/a  3. Are you having a reaction (difficulty breathing--STAT)? No  4. What is your medication issue? Patient states she had an episode of afib earlier, feels better now, monitoring on Apple watch. Patient wants to know if she should adjust Diltiazem

## 2017-12-22 NOTE — Telephone Encounter (Signed)
Spoke to patient this morning, said that she is feeling fine this morning and has not any Afib since yesterday.  We scheduled an appt with Scott 10/14 for f/u.  She verbalized understanding.

## 2017-12-30 ENCOUNTER — Encounter: Payer: Self-pay | Admitting: Physician Assistant

## 2018-01-01 NOTE — Progress Notes (Signed)
Cardiology Office Note:    Date:  01/02/2018   ID:  Vasti Yagi, DOB 1965-09-14, MRN [id number]  PCP:  Maurice Small, MD  Cardiologist:  Nelva Bush, MD   Electrophysiologist:  None   Referring MD: Maurice Small, MD   Chief Complaint  Patient presents with  . Atrial Fibrillation     History of Present Illness:    Melissa [name] is a 52 y.o. 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  She underewent resection of the atrial myxoma with atrial septal pericardial patch in 04/5850 complicated by post op atrial fibrillation.  She has been continued on anticoagulation with Rivaroxaban.  PASP has returned to normal since her resection.  She was last seen by [name] in 07/2017.     [name] returns for evaluation of atrial fibrillation.  She is here alone.  She has an Visual merchandiser.  She has had occasional palpitations at night for the past 6 weeks.  This seems to occur when she turns over in bed.  A week and a half ago, she had rapid palpitations.  Her watch confirmed atrial fibrillation with rapid ventricular rate with heart rate in the 140s.  This only lasted for a few minutes.  She did not take diltiazem.  She has not had a recurrence.  She has not had chest discomfort, shortness of breath, syncope, paroxysmal nocturnal dyspnea.  Of note, several days prior to her episode of atrial fibrillation, she took Lasix for several days for lower extremity swelling.  Prior CV studies:   The following studies were reviewed today:  TEE 05/17/17 EF 55-60, trivial MR, no residual atrial myxoma, BAE, small PFO, pericardial septal patch intact, bubble study negative  48 Hr Holter 02/08/17 Sinus rhythm with rare PACs and PVCs. Single 4 beat atrial run. No atrial fibrillation or other sustained arrhythmia.  Echo 02/07/17 EF 55-60, no RWMA, mild LAE, neg bubble study, mild TR, normal PASP  TEE  01/10/17 EF 55-60, no RWMA, trivial AI, mild MR, severe BAE, severe TR, PASP 48, interatrial patch significantly thickened with possible dehiscence and thrombus  R/L heart cath 12/27/16 LM normal LAD min irregs LCx ost 10 RCA normal  RA (mean): 11 mmHg RV (S/EDP): 71/13 mmHg PA (S/D, mean): 68/33 (47) mmHg PCWP (mean): 27 mmHg Ao sat: 99% PA sat: 73% Fick CO: 7.9 L/min Fick CI: 3.7 L/min/m^2 PVR: 2.5 Wood units Conclusions: 1. Minimal atherosclerotic plaquing; no angiographically significant coronary artery disease. 2. Hypervascular, mobile structure supplied predominantly by the right coronary artery, consistent with left atrial mass seen on recent echo. 3. Moderately elevated left and right heart filling pressures. 4. Severe pulmonary hypertension. 5. Normal Fick cardiac output/index.  PreCABG Dopplers 12/27/16 1. Carotid Doppler Evaluation - findings are suggestive of 1-39%  diameter reduction bilaterally. There is moderate tortuosity of  the left internal carotid artery noted.  Echo 12/24/16 EF 55-60, no RWMA, mod MR, MV gradients severely elevated due to L atrial mass (mean 20), severea LAE, larg (2.9 x 4.5 cm) mass, mod TR, PASP 89  Past Medical History:  Diagnosis Date  . (HFpEF) heart failure with preserved ejection fraction ([place])   . ADD (attention deficit disorder)   . Allergy   . Arthritis   . Asthma   . 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 Depression   . Migraine   . Osteoporosis   . Pulmonary hypertension ([place])   .  RLS (restless legs syndrome)    Surgical Hx: The patient  has a past surgical history that includes Cholecystectomy; Tubal ligation; Abdominal hysterectomy; Foot surgery; Total knee arthroplasty; Gastric bypass; Excision of atrial myxoma (Left, 12/28/2016); Intraoprative transesophageal echocardiogram (N/A, 12/28/2016); TEE without cardioversion (N/A, 01/10/2017); Cardioversion (N/A, 01/10/2017); and TEE without cardioversion (N/A, 05/17/2017).   Current  Medications: Current Meds  Medication Sig  . acetaminophen (TYLENOL) 500 MG tablet Take 1,000 mg by mouth every 8 (eight) hours as needed for mild pain or headache.  . albuterol (PROVENTIL HFA) 108 (90 Base) MCG/ACT inhaler Inhale 2 puffs into the lungs every 6 (six) hours as needed for wheezing or shortness of breath.   . cetirizine (ZYRTEC) 10 MG tablet Take 10 mg by mouth daily as needed for allergies.   . Cholecalciferol (VITAMIN D3) 50000 units CAPS Take 1 capsule by mouth once a week.  . diltiazem (CARDIZEM) 30 MG tablet Take 1 tablet (30 mg total) every 6 (six) hours as needed by mouth. For fast heart rate. Continue long acting diltiazem, total of no more than 360 mg per day  . furosemide (LASIX) 40 MG tablet As needed for weight gain  . Galcanezumab-gnlm 120 MG/ML SOAJ Inject 120 mg into the skin.   Marland Kitchen HYDROcodone-acetaminophen (NORCO) 7.5-325 MG tablet Take 0.5-1 tablets by mouth every 6 (six) hours as needed for moderate pain.   . hydrOXYzine (ATARAX/VISTARIL) 10 MG tablet Take 20 mg by mouth every 8 (eight) hours as needed (migraines).   . Multiple Vitamin (MULTIVITAMIN) tablet Take 1 tablet by mouth 2 (two) times daily.   . Multiple Vitamins-Minerals (PRESERVISION AREDS 2 PO) Take 1 tablet by mouth 2 (two) times daily.   . ondansetron (ZOFRAN-ODT) 4 MG disintegrating tablet Take 4 mg by mouth every 8 (eight) hours as needed for nausea or vomiting.   . pantoprazole (PROTONIX) 40 MG tablet Take 40 mg by mouth daily.  . potassium chloride SA (K-DUR,KLOR-CON) 20 MEQ tablet As needed for weight gain with Lasix  . pramipexole (MIRAPEX) 1 MG tablet Take 1 mg by mouth 2 (two) times daily.   . promethazine (PHENERGAN) 12.5 MG tablet Take 1-2 tablets by mouth 3 (three) times daily as needed for nausea/vomiting.  . rivaroxaban (XARELTO) 20 MG TABS tablet Take 1 tablet (20 mg total) by mouth daily.  . SUMAtriptan 6 MG/0.5ML SOAJ Inject one at onset of severe headache; may repeat once in 2 hours if  HA persists  . tiZANidine (ZANAFLEX) 2 MG tablet Take 2 mg by mouth every 6 (six) hours as needed (headache).   . [DISCONTINUED] Erenumab-aooe (AIMOVIG 140 DOSE) 70 MG/ML SOAJ Inject 140 mg into the skin every 30 (thirty) days.   . [DISCONTINUED] metoprolol tartrate (LOPRESSOR) 25 MG tablet Take 25 mg by mouth 2 (two) times daily.   Current Facility-Administered Medications for the 01/02/18 encounter (Office Visit) with Richardson Dopp T, PA-C  Medication  . methylPREDNISolone acetate (DEPO-MEDROL) injection 80 mg     Allergies:   Other   Social History   Tobacco Use  . Smoking status: Former Smoker    Packs/day: 0.25    Years: 10.00    Pack years: 2.50    Types: Cigarettes    Last attempt to quit: 1997    Years since quitting: 22.7  . Smokeless tobacco: Never Used  Substance Use Topics  . Alcohol use: No    Comment: Less than once a week.  . Drug use: No     Family Hx: The  patient's family history includes Breast cancer in her maternal aunt, maternal aunt, and maternal grandmother; Cancer in her mother; Cancer (age of onset: 67) in her sister; Depression in her other; Stroke in her paternal grandfather.  ROS:   Please see the history of present illness.    Review of Systems  Constitution: Positive for diaphoresis.  Cardiovascular: Positive for irregular heartbeat.  Musculoskeletal: Positive for back pain and myalgias.  Neurological: Positive for headaches.   All other systems reviewed and are negative.   EKGs/Labs/Other Test Reviewed:    EKG:  EKG is  ordered today.  The ekg ordered today demonstrates normal sinus rhythm, heart rate 63, normal axis, QTC 433, similar to old EKG  Recent Labs: 01/08/2017: B Natriuretic Peptide 383.4; Magnesium 2.1 11/14/2017: ALT 23; BUN 8; Creatinine 0.65; Hemoglobin 12.5; Platelet Count 232; Potassium 3.8; Sodium 141   Recent Lipid Panel Lab Results  Component Value Date/Time   CHOL 145 06/21/2012 04:05 PM   TRIG 36.0 06/21/2012  04:05 PM   HDL 64.60 06/21/2012 04:05 PM   CHOLHDL 2 06/21/2012 04:05 PM   LDLCALC 73 06/21/2012 04:05 PM   LDLDIRECT 111.7 07/08/2008 08:39 AM    Physical Exam:    VS:  BP 110/70   Pulse 70   Ht 5\' 6"  (1.676 m)   Wt 225 lb 6.4 oz (102.2 kg)   SpO2 98%   BMI 36.38 kg/m     Wt Readings from Last 3 Encounters:  01/02/18 225 lb 6.4 oz (102.2 kg)  11/16/17 220 lb 11.2 oz (100.1 kg)  09/23/17 225 lb (102.1 kg)     Physical Exam  Constitutional: She is oriented to person, place, and time. She appears well-developed and well-nourished. No distress.  HENT:  Head: Normocephalic and atraumatic.  Eyes: No scleral icterus.  Neck: No JVD present. No thyromegaly present.  Cardiovascular: Normal rate and regular rhythm.  No murmur heard. Pulmonary/Chest: Effort normal. She has no rales.  Abdominal: Soft. She exhibits no distension.  Musculoskeletal: She exhibits no edema.  Lymphadenopathy:    She has no cervical adenopathy.  Neurological: She is alert and oriented to person, place, and time.  Skin: Skin is warm and dry.  Psychiatric: She has a normal mood and affect.    ASSESSMENT & PLAN:    Paroxysmal atrial fibrillation ([place]) I reviewed several tracings from her Apple watch.  This did confirm atrial fibrillation on 2 occasions.  There is one other occasion in the middle of the night that appears to be atrial fibrillation with controlled rate.  She also has frequent PACs noted.  This may explain some of her nightly symptoms.  She does admit to increased stress and poor sleep recently.  She does not drink an excessive amount of caffeine.  She remains on anticoagulation with Rivaroxaban.  We discussed increasing her beta-blocker first.  If she continues to have recurrent atrial fibrillation despite this, she may need antiarrhythmic drug therapy.  As she had no significant CAD on cardiac catheterization last year, she would likely be a good candidate for flecainide.  Of note, [name] has  recommended that she follow-up with Dr. Johnsie Cancel in the future given his transition to [place] BMET, magnesium, TSH  -Increase metoprolol tartrate to 37.5 mg twice daily  -Continue Rivaroxaban 20 mg daily  -Follow-up with Dr. Johnsie Cancel or me in 6 to 8 weeks  -She will submit atrial fibrillation tracings through MyChart  -If she has more frequent symptoms, consider referral to Dr.  Allred  History of atrial myxoma S/p resection.  Transesophageal echocardiogram in 04/2017 demonstrated intact pericardial septal patch and no residual myxoma.  Chronic heart failure with preserved ejection fraction (HCC) Overall, volume status stable.  She did take as needed Lasix recently for pedal edema after taking a trip to [place].     Dispo:  Return in about 8 weeks (around 02/27/2018) for Routine Follow Up w/ Dr. Johnsie Cancel, or Richardson Dopp, PA-C.   Medication Adjustments/Labs and Tests Ordered: Current medicines are reviewed at length with the patient today.  Concerns regarding medicines are outlined above.  Tests Ordered: Orders Placed This Encounter  Procedures  . Basic Metabolic Panel (BMET)  . Magnesium  . TSH  . EKG 12-Lead   Medication Changes: Meds ordered this encounter  Medications  . metoprolol tartrate (LOPRESSOR) 25 MG tablet    Sig: Take 1 and 1/2 tabs twice daily = 37.5 mg twice daily    Dispense:  180 tablet    Refill:  3    DOSE INCREASE    Signed, Richardson Dopp, PA-C  01/02/2018 12:48 PM    [facility] [address], [place], [place]  [address] Phone: [phone]; Fax: [phone]

## 2018-01-02 ENCOUNTER — Ambulatory Visit (INDEPENDENT_AMBULATORY_CARE_PROVIDER_SITE_OTHER): Payer: 59 | Admitting: Physician Assistant

## 2018-01-02 ENCOUNTER — Encounter: Payer: Self-pay | Admitting: Physician Assistant

## 2018-01-02 VITALS — BP 110/70 | HR 70 | Ht 66.0 in | Wt 225.4 lb

## 2018-01-02 DIAGNOSIS — I480 Paroxysmal atrial fibrillation: Secondary | ICD-10-CM

## 2018-01-02 DIAGNOSIS — I5032 Chronic diastolic (congestive) heart failure: Secondary | ICD-10-CM | POA: Diagnosis not present

## 2018-01-02 DIAGNOSIS — Z86018 Personal history of other benign neoplasm: Secondary | ICD-10-CM | POA: Diagnosis not present

## 2018-01-02 MED ORDER — METOPROLOL TARTRATE 25 MG PO TABS: ORAL_TABLET | ORAL | 3 refills | Status: DC

## 2018-01-02 NOTE — Progress Notes (Signed)
Pt has been moved to Dr. Johnsie Cancel schedule 03/02/18 @ 2:45. Ok per Dr. Johnsie Cancel to use time slot. See not from Dr. Johnsie Cancel. Pt is aware.

## 2018-01-02 NOTE — Patient Instructions (Addendum)
Medication Instructions:  INCREASE METOPROLOL 37.5 MG TWICE DAILY; NEW RX HAS BEEN SENT IN  If you need a refill on your cardiac medications before your next appointment, please call your pharmacy.   Lab work: TODAY BMET, MAGNESIUM LEVEL, TSH If you have labs (blood work) drawn today and your tests are completely normal, you will receive your results only by: Marland Kitchen MyChart Message (if you have MyChart) OR . A paper copy in the mail If you have any lab test that is abnormal or we need to change your treatment, we will call you to review the results.  Testing/Procedures: NONE ORDERED TODAY  Follow-Up: At [facility], you and your health needs are our priority.  As part of our continuing mission to provide you with exceptional heart care, we have created designated Provider Care Teams.  These Care Teams include your primary Cardiologist (physician) and Advanced Practice Providers (APPs -  Physician Assistants and Nurse Practitioners) who all work together to provide you with the care you need, when you need it. You will need a follow up appointment in 8 weeks.  Please call our office 2 months in advance to schedule this appointment.  You may see DR. [place], PAC  or one of the following Advanced Practice Providers on your designated Care Team:     Any Other Special Instructions Will Be Listed Below (If Applicable). SEND A-FIB TRACING TO MY CHART

## 2018-01-02 NOTE — Telephone Encounter (Signed)
Tele strips from patient's Apple watch. Richardson Dopp, PA-C    01/02/2018 5:35 PM

## 2018-01-03 ENCOUNTER — Telehealth: Payer: Self-pay | Admitting: *Deleted

## 2018-01-03 LAB — BASIC METABOLIC PANEL
BUN / CREAT RATIO: 18 (ref 9–23)
BUN: 11 mg/dL (ref 6–24)
CALCIUM: 9.6 mg/dL (ref 8.7–10.2)
CO2: 22 mmol/L (ref 20–29)
Chloride: 102 mmol/L (ref 96–106)
Creatinine, Ser: 0.6 mg/dL (ref 0.57–1.00)
GFR, EST AFRICAN AMERICAN: 120 mL/min/{1.73_m2} (ref >59)
GFR, EST NON AFRICAN AMERICAN: 104 mL/min/{1.73_m2} (ref >59)
Glucose: 90 mg/dL (ref 65–99)
Potassium: 4.2 mmol/L (ref 3.5–5.2)
SODIUM: 143 mmol/L (ref 134–144)

## 2018-01-03 LAB — MAGNESIUM: Magnesium: 2.5 mg/dL — ABNORMAL HIGH (ref 1.6–2.3)

## 2018-01-03 LAB — TSH: TSH: 0.995 u[IU]/mL (ref 0.450–4.500)

## 2018-01-03 NOTE — Telephone Encounter (Signed)
Pt has been notified of lab results by phone with verbal understanding. Pt thanked me for the call.   

## 2018-01-03 NOTE — Telephone Encounter (Signed)
-----   Message from Liliane Shi, [place] sent at 01/03/2018  7:54 AM EDT ----- Kidney function (creatinine), potassium, thyroid (TSH) are normal.  The magnesium is mildly elevated. Recommendations:  - Continue current medications and follow up as planned.  Richardson Dopp, PA-C    01/03/2018 7:52 AM

## 2018-01-09 ENCOUNTER — Encounter: Payer: 59 | Admitting: Genetic Counselor

## 2018-01-09 ENCOUNTER — Other Ambulatory Visit: Payer: 59

## 2018-01-10 DIAGNOSIS — I480 Paroxysmal atrial fibrillation: Secondary | ICD-10-CM

## 2018-01-11 ENCOUNTER — Encounter: Payer: 59 | Admitting: Genetic Counselor

## 2018-01-11 ENCOUNTER — Other Ambulatory Visit: Payer: 59

## 2018-01-11 ENCOUNTER — Encounter: Payer: 59 | Admitting: Cardiothoracic Surgery

## 2018-01-11 MED ORDER — FLECAINIDE ACETATE 50 MG PO TABS: 50.0000 mg | ORAL_TABLET | Freq: Two times a day (BID) | ORAL | 3 refills | Status: DC

## 2018-01-11 NOTE — Telephone Encounter (Signed)
I reviewed her case with Dr. Johnsie Cancel. PLAN:  1. Start Flecainide 50 mg Twice daily  2. Remain on Metoprolol tartrate 37.5 mg Twice daily  3. Arrange plain exercise tolerance test 7 days after starting Flecainide 4. Make sure she takes Metoprolol and Flecainide the day of her ETT. 5. Arrange 30 day event monitor - it should be placed 1-2 weeks after starting Flecainide 6. She has follow up with Dr. Johnsie Cancel in Dec.  See if the appointment can be moved up any earlier.  If the monitor will not be done until Dec or if it cannot be moved up, follow up with Dr. Johnsie Cancel 03/02/18 as planned. Richardson Dopp, PA-C    01/11/2018 2:15 PM

## 2018-01-11 NOTE — Telephone Encounter (Signed)
I left message for the pt to call back to discuss recommendations per Richardson Dopp, PA and DR. Nishan.

## 2018-01-11 NOTE — Telephone Encounter (Signed)
Pt called back and has been advised of recommendations per Richardson Dopp, PA and Dr. Johnsie Cancel. Pt agreeable to start Flecainide 50 mg BID, continue Metoprolol Tart 37.5 mg BID, schedule GXT to be done 7 days after starting Flecainide and to scheduled Event Monitor to be placed 1-2 weeks after starting Flecainide. Advised per PA would like to see if we can move Dr. Johnsie Cancel appt up in 02/2018. I did advise we will need to allow time for the monitor to be read and ready for Dr. Johnsie Cancel for her appt. Pt states she would like to keep her appt as 03/02/18. I did advise if there is something urgent on monitor and Dr. Johnsie Cancel feels pt needs to be seen sooner we will bring her in. Pt is agreeable to this plan of care. Pt is aware [facility] will call her to schedule her for the GXT and Event Monitor. I will send in Rx for the Flecainide. Pt thanked me for the call.

## 2018-01-16 ENCOUNTER — Other Ambulatory Visit: Payer: Self-pay | Admitting: *Deleted

## 2018-01-16 ENCOUNTER — Other Ambulatory Visit: Payer: Self-pay | Admitting: Cardiology

## 2018-01-16 DIAGNOSIS — I480 Paroxysmal atrial fibrillation: Secondary | ICD-10-CM

## 2018-01-16 MED ORDER — FLECAINIDE ACETATE 50 MG PO TABS: 50.0000 mg | ORAL_TABLET | Freq: Two times a day (BID) | ORAL | 3 refills | Status: DC

## 2018-01-16 MED ORDER — RIVAROXABAN 20 MG PO TABS: 20.0000 mg | ORAL_TABLET | Freq: Every day | ORAL | 3 refills | Status: DC

## 2018-01-16 MED ORDER — METOPROLOL TARTRATE 25 MG PO TABS: ORAL_TABLET | ORAL | 3 refills | Status: DC

## 2018-01-16 NOTE — Telephone Encounter (Signed)
Rx Xarelto has been sent to [facility] on [address] per pt MY CHART request.

## 2018-01-16 NOTE — Telephone Encounter (Signed)
See previous notes from earlier today. Our office has received [facility] information. I will send into [facility] for the Flecainide, Xarelto, Metoprolol.

## 2018-01-16 NOTE — Telephone Encounter (Signed)
Refills sent in already by C. Fiato, CMA. Richardson Dopp, PA-C    01/16/2018 5:09 PM

## 2018-01-19 ENCOUNTER — Inpatient Hospital Stay: Payer: 59

## 2018-01-19 ENCOUNTER — Inpatient Hospital Stay: Payer: 59 | Attending: Hematology | Admitting: Licensed Clinical Social Worker

## 2018-01-19 ENCOUNTER — Encounter: Payer: Self-pay | Admitting: Licensed Clinical Social Worker

## 2018-01-19 DIAGNOSIS — Z808 Family history of malignant neoplasm of other organs or systems: Secondary | ICD-10-CM | POA: Diagnosis not present

## 2018-01-19 DIAGNOSIS — Z803 Family history of malignant neoplasm of breast: Secondary | ICD-10-CM | POA: Diagnosis not present

## 2018-01-19 DIAGNOSIS — Z7183 Encounter for nonprocreative genetic counseling: Secondary | ICD-10-CM

## 2018-01-19 DIAGNOSIS — Z8049 Family history of malignant neoplasm of other genital organs: Secondary | ICD-10-CM

## 2018-01-19 NOTE — Progress Notes (Signed)
REFERRING PROVIDER: Truitt Merle, MD [address] [place], [place] [address]  PRIMARY PROVIDER:  Maurice Small, MD  PRIMARY REASON FOR VISIT:  1. Family history of breast cancer   2. Family history of thyroid cancer   3. Family history of uterine cancer      HISTORY OF PRESENT ILLNESS:   Melissa [name], a 52 y.o. female, was seen for a Melissa [name] cancer genetics consultation at the request of Melissa [name] due to a family history of cancer.  Melissa [name] presents to clinic today to discuss the possibility of a hereditary predisposition to cancer, genetic testing, and to further clarify her future cancer risks, as well as potential cancer risks for family members.   Melissa [name] is a 52 y.o. female with no personal history of cancer.    HORMONAL RISK FACTORS:  Menarche was at age 50.  First live birth at age 73.  OCP use for approximately 20 years.  Ovaries intact: no; left removed Hysterectomy: yes.  Menopausal status: postmenopausal.  HRT use: 1 years. Colonoscopy: yes; polyp on last cscope. Mammogram within the last year: yes. Number of breast biopsies: 1.  Past Medical History:  Diagnosis Date  . (HFpEF) heart failure with preserved ejection fraction ([place])   . ADD (attention deficit disorder)   . Allergy   . Arthritis   . Asthma   . 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 Depression   . Family history of breast cancer   . Family history of thyroid cancer   . Family history of uterine cancer   . Migraine   . Osteoporosis   . Pulmonary hypertension ([place])   . RLS (restless legs syndrome)     Past Surgical History:  Procedure Laterality Date  . ABDOMINAL HYSTERECTOMY    . CARDIOVERSION N/A 01/10/2017   Procedure: CARDIOVERSION;  Surgeon: Melissa Spark, MD;  Location: [facility] ENDOSCOPY;  Service: Cardiovascular;  Laterality: N/A;  . CHOLECYSTECTOMY    . EXCISION OF ATRIAL MYXOMA Left 12/28/2016   Procedure: EXCISION OF LEFT ATRIAL MYXOMA;  Surgeon: Melissa Poot, MD;  Location: [place];   Service: Open Heart Surgery;  Laterality: Left;  . FOOT SURGERY    . GASTRIC BYPASS    . INTRAOPERATIVE TRANSESOPHAGEAL ECHOCARDIOGRAM N/A 12/28/2016   Procedure: INTRAOPERATIVE TRANSESOPHAGEAL ECHOCARDIOGRAM;  Surgeon: Melissa Poot, MD;  Location: [place];  Service: Open Heart Surgery;  Laterality: N/A;  . TEE WITHOUT CARDIOVERSION N/A 01/10/2017   Procedure: TRANSESOPHAGEAL ECHOCARDIOGRAM (TEE);  Surgeon: Melissa Spark, MD;  Location: [facility] ENDOSCOPY;  Service: Cardiovascular;  Laterality: N/A;  . TEE WITHOUT CARDIOVERSION N/A 05/17/2017   Procedure: TRANSESOPHAGEAL ECHOCARDIOGRAM (TEE);  Surgeon: Melissa Hector, MD;  Location: [facility] ENDOSCOPY;  Service: Cardiovascular;  Laterality: N/A;  . TOTAL KNEE ARTHROPLASTY    . TUBAL LIGATION      Social History   Socioeconomic History  . Marital status: Married    Spouse name: Not on file  . Number of children: Not on file  . Years of education: Not on file  . Highest education level: Not on file  Occupational History  . Not on file  Social Needs  . Financial resource strain: Not on file  . Food insecurity:    Worry: Not on file    Inability: Not on file  . Transportation needs:    Medical: Not on file    Non-medical: Not on file  Tobacco Use  . Smoking status: Former Smoker    Packs/day: 0.25  Years: 10.00    Pack years: 2.50    Types: Cigarettes    Last attempt to quit: 1997    Years since quitting: 22.8  . Smokeless tobacco: Never Used  Substance and Sexual Activity  . Alcohol use: No    Comment: Less than once a week.  . Drug use: No  . Sexual activity: Not on file  Lifestyle  . Physical activity:    Days per week: Not on file    Minutes per session: Not on file  . Stress: Not on file  Relationships  . Social connections:    Talks on phone: Not on file    Gets together: Not on file    Attends religious service: Not on file    Active member of club or organization: Not on file    Attends meetings of clubs or  organizations: Not on file    Relationship status: Not on file  Other Topics Concern  . Not on file  Social History Narrative   Married.     FAMILY HISTORY:  We obtained a detailed, 4-generation family history.  Significant diagnoses are listed below: Family History  Problem Relation Age of Onset  . Thyroid cancer Mother   . Endometrial cancer Sister 20  . Breast cancer Maternal Aunt        dx > 50  . Breast cancer Maternal Grandmother        dx >50  . Breast cancer Maternal Aunt        dx  >50  . Depression Other        FMHx  . Stroke Paternal Grandfather     Melissa [name] has two sons: Melissa [name], 20 and Melissa [name], [place]. She also has an adopted daughter, Melissa [name], who is 72. Ms. Melissa [name] had a sister who was diagnosed with endometrial cancer at 17, and died in an accident at 33.   Melissa [name] father is living at 51, he has a history of skin cancer. He had two brothers, no cancers for these individuals or Melissa [name]'s paternal cousins. The patient's paternal grandmother died when she was older than 53, and grandfather died in his 77's.  Melissa [name] mother was diagnosed with an "aggressive" thyroid cancer at 72 and died at 98. She had four sisters. Two of the patient's maternal aunts had breast cancer diagnosed when they were older than 58. They are both deceased. No cancers for maternal cousins. The patient's maternal grandmother also had breast cancer diagnosed when she was older, and died when she was older. Patient's grandfather died older than 73.   Melissa [name] is unaware of previous family history of genetic testing for hereditary cancer risks. Patient's maternal ancestors are of caucasian descent, and paternal ancestors are of caucasian descent. There is no reported Ashkenazi Jewish ancestry. There is no known consanguinity.  GENETIC COUNSELING ASSESSMENT: Melissa [name] is a 52 y.o. female with a family history of cancer which is somewhat suggestive of a Hereditary Cancer Predisposition  Syndrome. We, therefore, discussed and recommended the following at today's visit.   DISCUSSION: We discussed that about 5-10% of breast cancer cases are hereditary with most cases due to BRCA mutations.  Other genes associated with hereditary breast cancer cases include ATM, CHEK2 and PALB2.  We also discussed PTEN given the other cancers in her family. We reviewed the characteristics, features and inheritance patterns of hereditary cancer syndromes. We also discussed genetic testing, including the appropriate family members to test, the process of testing, insurance  coverage and turn-around-time for results. We discussed the implications of a negative, positive and/or variant of uncertain significant result. We recommended Melissa [name] pursue genetic testing for the [employer].  The Multi-Cancer Panel offered by [facility] includes sequencing and/or deletion duplication testing of the following 84 genes: AIP, ALK, APC, ATM, AXIN2,BAP1,  BARD1, BLM, BMPR1A, BRCA1, BRCA2, BRIP1, CASR, CDC73, CDH1, CDK4, CDKN1B, CDKN1C, CDKN2A (p14ARF), CDKN2A (p16INK4a), CEBPA, CHEK2, CTNNA1, DICER1, DIS3L2, EGFR (c.2369C>T, p.Thr790Met variant only), EPCAM (Deletion/duplication testing only), FH, FLCN, GATA2, GPC3, GREM1 (Promoter region deletion/duplication testing only), HOXB13 (c.251G>A, p.Gly84Glu), HRAS, KIT, MAX, MEN1, MET, MITF (c.952G>A, p.Glu318Lys variant only), MLH1, MSH2, MSH3, MSH6, MUTYH, NBN, NF1, NF2, NTHL1, PALB2, PDGFRA, PHOX2B, PMS2, POLD1, POLE, POT1, PRKAR1A, PTCH1, PTEN, RAD50, RAD51C, RAD51D, RB1, RECQL4, RET, RUNX1, SDHAF2, SDHA (sequence changes only), SDHB, SDHC, SDHD, SMAD4, SMARCA4, SMARCB1, SMARCE1, STK11, SUFU, TERC, TERT, TMEM127, TP53, TSC1, TSC2, VHL, WRN and WT1.   We discussed that if she is found to have a mutation in one of these genes, it may impact future medical management recommendations such as increased cancer screenings and consideration of risk reducing surgeries.  A  positive result could also have implications for the patient's family members.  A Negative result would mean we were unable to identify a hereditary component to her family history of cancer but does not rule out the possibility of a hereditary basis for her family history of cancer.  There could be mutations that are undetectable by current technology, or in genes not yet tested or identified to increase cancer risk.    We discussed the potential to find a Variant of Uncertain Significance or VUS.  These are variants that have not yet been identified as pathogenic or benign, and it is unknown if this variant is associated with increased cancer risk or if this is a normal finding.  Most VUS's are reclassified to benign or likely benign.   It should not be used to make medical management decisions. With time, we suspect the lab will determine the significance of any VUS's identified if any.   Based on Melissa [name]'s family history of cancer, she meets NCCN medical criteria for genetic testing. Despite that she meets criteria, she may still have an out of pocket cost. The lab will notify her of an out of pocket cost, if any.  We discussed that some people do not want to undergo genetic testing due to fear of genetic discrimination.  A federal law called the Genetic Information Non-Discrimination Act (GINA) of 2008 helps protect individuals against genetic discrimination based on their genetic test results.  It impacts both health insurance and employment.  For health insurance, it protects against increased premiums, being kicked off insurance or being forced to take a test in order to be insured.  For employment it protects against hiring, firing and promoting decisions based on genetic test results.  Health status due to a cancer diagnosis is not protected under GINA.  This law does not protect life insurance, disability insurance, or other types of insurance.   PLAN: After considering the risks, benefits,  and limitations, Melissa [name]  provided informed consent to pursue genetic testing and the blood sample was sent to [facility] for analysis of the Multi-Cancer Panel. Results should be available within approximately 2-3 weeks' time, at which point they will be disclosed by telephone to Melissa [name], as will any additional recommendations warranted by these results. Ms. Depaoli will receive a summary of her genetic counseling visit and  a copy of her results once available. This information will also be available in Epic.  Lastly, we encouraged Ms. Hem to remain in contact with cancer genetics annually so that we can continuously update the family history and inform her of any changes in cancer genetics and testing that may be of benefit for this family.   Ms.  Dittrich questions were answered to her satisfaction today. Our contact information was provided should additional questions or concerns arise. Thank you for the referral and allowing [place] to share in the care of your patient.   Faith Rogue, MS Genetic Counselor [email or web address] Phone: [phone]  The patient was seen for a total of 30 minutes in face-to-face genetic counseling.  The patient was accompanied today by her husband, Gerald Stabs.

## 2018-01-20 ENCOUNTER — Telehealth: Payer: Self-pay | Admitting: *Deleted

## 2018-01-20 ENCOUNTER — Ambulatory Visit (INDEPENDENT_AMBULATORY_CARE_PROVIDER_SITE_OTHER): Payer: 59

## 2018-01-20 DIAGNOSIS — I480 Paroxysmal atrial fibrillation: Secondary | ICD-10-CM

## 2018-01-20 LAB — EXERCISE TOLERANCE TEST
CHL CUP MPHR: 167 {beats}/min
CHL CUP RESTING HR STRESS: 64 {beats}/min
CSEPEDS: 0 s
CSEPEW: 8.5 METS
CSEPHR: 78 %
Exercise duration (min): 7 min
Peak HR: 131 {beats}/min
RPE: 15

## 2018-01-20 NOTE — Telephone Encounter (Signed)
-----   Message from Liliane Shi, [place] sent at 01/20/2018  4:43 PM EDT ----- The stress test is normal.  Recommendations:  - Continue current medications and follow up as planned.   - Send copy to PCP Richardson Dopp, PA-C    01/20/2018 4:40 PM

## 2018-01-20 NOTE — Telephone Encounter (Signed)
DPR ok to lmom . Lmom GXT normal. Continue on current Tx plan at this time. If any questions please call the office [phone].

## 2018-01-23 NOTE — Telephone Encounter (Signed)
I responded to patient. Richardson Dopp, PA-C    01/23/2018 2:43 PM

## 2018-01-25 ENCOUNTER — Ambulatory Visit (INDEPENDENT_AMBULATORY_CARE_PROVIDER_SITE_OTHER): Payer: 59 | Admitting: Cardiothoracic Surgery

## 2018-01-25 ENCOUNTER — Encounter: Payer: Self-pay | Admitting: Cardiothoracic Surgery

## 2018-01-25 ENCOUNTER — Other Ambulatory Visit: Payer: Self-pay

## 2018-01-25 VITALS — BP 132/82 | HR 60 | Resp 20 | Ht 66.0 in | Wt 225.0 lb

## 2018-01-25 DIAGNOSIS — Z86018 Personal history of other benign neoplasm: Secondary | ICD-10-CM | POA: Diagnosis not present

## 2018-01-25 NOTE — Progress Notes (Signed)
PCP is Maurice Small, MD Referring Provider is End, Harrell Gave, MD  Chief Complaint  Patient presents with  . Follow-up    6 month f/u HX of atrial myxoma removal, stress test 01/20/18, last ECHO 05/17/17    HPI:   Past Medical History:  Diagnosis Date  . (HFpEF) heart failure with preserved ejection fraction ([place])   . ADD (attention deficit disorder)   . Allergy   . Arthritis   . Asthma   . 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 Depression   . Family history of breast cancer   . Family history of thyroid cancer   . Family history of uterine cancer   . Migraine   . Osteoporosis   . Pulmonary hypertension ([place])   . RLS (restless legs syndrome)     Past Surgical History:  Procedure Laterality Date  . ABDOMINAL HYSTERECTOMY    . CARDIOVERSION N/A 01/10/2017   Procedure: CARDIOVERSION;  Surgeon: Dorothy Spark, MD;  Location: [facility] ENDOSCOPY;  Service: Cardiovascular;  Laterality: N/A;  . CHOLECYSTECTOMY    . EXCISION OF ATRIAL MYXOMA Left 12/28/2016   Procedure: EXCISION OF LEFT ATRIAL MYXOMA;  Surgeon: Ivin Poot, MD;  Location: [place];  Service: Open Heart Surgery;  Laterality: Left;  . FOOT SURGERY    . GASTRIC BYPASS    . INTRAOPERATIVE TRANSESOPHAGEAL ECHOCARDIOGRAM N/A 12/28/2016   Procedure: INTRAOPERATIVE TRANSESOPHAGEAL ECHOCARDIOGRAM;  Surgeon: Ivin Poot, MD;  Location: [place];  Service: Open Heart Surgery;  Laterality: N/A;  . TEE WITHOUT CARDIOVERSION N/A 01/10/2017   Procedure: TRANSESOPHAGEAL ECHOCARDIOGRAM (TEE);  Surgeon: Dorothy Spark, MD;  Location: [facility] ENDOSCOPY;  Service: Cardiovascular;  Laterality: N/A;  . TEE WITHOUT CARDIOVERSION N/A 05/17/2017   Procedure: TRANSESOPHAGEAL ECHOCARDIOGRAM (TEE);  Surgeon: Josue Hector, MD;  Location: [facility] ENDOSCOPY;  Service: Cardiovascular;  Laterality: N/A;  . TOTAL KNEE ARTHROPLASTY    . TUBAL LIGATION      Family History  Problem Relation Age of Onset  . Thyroid cancer Mother   . Endometrial cancer Sister 71   . Breast cancer Maternal Aunt        dx > 50  . Breast cancer Maternal Grandmother        dx >50  . Breast cancer Maternal Aunt        dx  >50  . Depression Other        FMHx  . Stroke Paternal Grandfather     Social History Social History   Tobacco Use  . Smoking status: Former Smoker    Packs/day: 0.25    Years: 10.00    Pack years: 2.50    Types: Cigarettes    Last attempt to quit: 1997    Years since quitting: 22.8  . Smokeless tobacco: Never Used  Substance Use Topics  . Alcohol use: No    Comment: Less than once a week.  . Drug use: No    Current Outpatient Medications  Medication Sig Dispense Refill  . albuterol (PROVENTIL HFA) 108 (90 Base) MCG/ACT inhaler Inhale 2 puffs into the lungs every 6 (six) hours as needed for wheezing or shortness of breath.     . cetirizine (ZYRTEC) 10 MG tablet Take 10 mg by mouth daily as needed for allergies.     . Cholecalciferol (VITAMIN D3) 50000 units CAPS Take 1 capsule by mouth once a week.    . diltiazem (CARDIZEM) 30 MG tablet Take 1 tablet (30 mg total) every 6 (six) hours as needed by mouth.  For fast heart rate. Continue long acting diltiazem, total of no more than 360 mg per day 30 tablet 0  . flecainide (TAMBOCOR) 50 MG tablet Take 1 tablet (50 mg total) by mouth 2 (two) times daily. 180 tablet 3  . furosemide (LASIX) 40 MG tablet As needed for weight gain 30 tablet 3  . Galcanezumab-gnlm 120 MG/ML SOAJ Inject 120 mg into the skin.     Marland Kitchen HYDROcodone-acetaminophen (NORCO) 7.5-325 MG tablet Take 0.5-1 tablets by mouth every 6 (six) hours as needed for moderate pain.     . hydrOXYzine (ATARAX/VISTARIL) 10 MG tablet Take 20 mg by mouth every 8 (eight) hours as needed (migraines).     . metoprolol tartrate (LOPRESSOR) 25 MG tablet Take 1 and 1/2 tabs twice daily = 37.5 mg twice daily 270 tablet 3  . Multiple Vitamin (MULTIVITAMIN) tablet Take 1 tablet by mouth 2 (two) times daily.     . Multiple Vitamins-Minerals (PRESERVISION  AREDS 2 PO) Take 1 tablet by mouth 2 (two) times daily.     . ondansetron (ZOFRAN-ODT) 4 MG disintegrating tablet Take 4 mg by mouth every 8 (eight) hours as needed for nausea or vomiting.   0  . potassium chloride SA (K-DUR,KLOR-CON) 20 MEQ tablet As needed for weight gain with Lasix 30 tablet 3  . pramipexole (MIRAPEX) 1 MG tablet Take 1 mg by mouth 2 (two) times daily.     . promethazine (PHENERGAN) 12.5 MG tablet Take 1-2 tablets by mouth 3 (three) times daily as needed for nausea/vomiting.  5  . rivaroxaban (XARELTO) 20 MG TABS tablet Take 1 tablet (20 mg total) by mouth daily. 90 tablet 3  . SUMAtriptan 6 MG/0.5ML SOAJ Inject one at onset of severe headache; may repeat once in 2 hours if HA persists    . tiZANidine (ZANAFLEX) 2 MG tablet Take 2 mg by mouth every 6 (six) hours as needed (headache).   5  . acetaminophen (TYLENOL) 500 MG tablet Take 1,000 mg by mouth every 8 (eight) hours as needed for mild pain or headache.    . pantoprazole (PROTONIX) 40 MG tablet Take 40 mg by mouth daily.     Current Facility-Administered Medications  Medication Dose Route Frequency Provider Last Rate Last Dose  . methylPREDNISolone acetate (DEPO-MEDROL) injection 80 mg  80 mg Other Once Magnus Sinning, MD        Allergies  Allergen Reactions  . Other Anaphylaxis and Other (See Comments)    HONEYDEW MELON    Review of Systems  BP 132/82   Pulse 60   Resp 20   Ht 5\' 6"  (1.676 m)   Wt 225 lb (102.1 kg)   SpO2 98% Comment: RA  BMI 36.32 kg/m  Physical Exam   Diagnostic Tests:   Impression:   Plan:   Len Childs, MD [facility] [phone]

## 2018-01-25 NOTE — Progress Notes (Signed)
PCP is Maurice Small, MD Referring Provider is End, Harrell Gave, MD  Chief Complaint  Patient presents with  . Follow-up    6 month f/u HX of atrial myxoma removal, stress test 01/20/18, last ECHO 05/17/17    HPI: Patient is doing well 1 year after resection of a large left atrial myxoma with functional severe mitral stenosis, pulmonary hypertension, and left atrial dilatation. Most recent echocardiogram about 6 months ago shows no recurrent tumor, trivial MR, negative bubble study of the atrial septal pericardial patch.  Patient has been having some A. fib and PVCs and was recently placed on flecainide after a stress test was normal.  The patient had laparoscopic surgery at [facility] last summer for bile reflux gastritis and the patient's GI symptoms have significantly improved.  She is continued on Xarelto without any bleeding complications.  Past Medical History:  Diagnosis Date  . (HFpEF) heart failure with preserved ejection fraction ([place])   . ADD (attention deficit disorder)   . Allergy   . Arthritis   . Asthma   . 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 Depression   . Family history of breast cancer   . Family history of thyroid cancer   . Family history of uterine cancer   . Migraine   . Osteoporosis   . Pulmonary hypertension ([place])   . RLS (restless legs syndrome)     Past Surgical History:  Procedure Laterality Date  . ABDOMINAL HYSTERECTOMY    . CARDIOVERSION N/A 01/10/2017   Procedure: CARDIOVERSION;  Surgeon: Dorothy Spark, MD;  Location: [facility] ENDOSCOPY;  Service: Cardiovascular;  Laterality: N/A;  . CHOLECYSTECTOMY    . EXCISION OF ATRIAL MYXOMA Left 12/28/2016   Procedure: EXCISION OF LEFT ATRIAL MYXOMA;  Surgeon: Ivin Poot, MD;  Location: [place];  Service: Open Heart Surgery;  Laterality: Left;  . FOOT SURGERY    . GASTRIC BYPASS    . INTRAOPERATIVE TRANSESOPHAGEAL ECHOCARDIOGRAM N/A 12/28/2016   Procedure: INTRAOPERATIVE TRANSESOPHAGEAL ECHOCARDIOGRAM;  Surgeon: Ivin Poot, MD;  Location: [place];  Service: Open Heart Surgery;  Laterality: N/A;  . TEE WITHOUT CARDIOVERSION N/A 01/10/2017   Procedure: TRANSESOPHAGEAL ECHOCARDIOGRAM (TEE);  Surgeon: Dorothy Spark, MD;  Location: [facility] ENDOSCOPY;  Service: Cardiovascular;  Laterality: N/A;  . TEE WITHOUT CARDIOVERSION N/A 05/17/2017   Procedure: TRANSESOPHAGEAL ECHOCARDIOGRAM (TEE);  Surgeon: Josue Hector, MD;  Location: [facility] ENDOSCOPY;  Service: Cardiovascular;  Laterality: N/A;  . TOTAL KNEE ARTHROPLASTY    . TUBAL LIGATION      Family History  Problem Relation Age of Onset  . Thyroid cancer Mother   . Endometrial cancer Sister 38  . Breast cancer Maternal Aunt        dx > 50  . Breast cancer Maternal Grandmother        dx >50  . Breast cancer Maternal Aunt        dx  >50  . Depression Other        FMHx  . Stroke Paternal Grandfather     Social History Social History   Tobacco Use  . Smoking status: Former Smoker    Packs/day: 0.25    Years: 10.00    Pack years: 2.50    Types: Cigarettes    Last attempt to quit: 1997    Years since quitting: 22.8  . Smokeless tobacco: Never Used  Substance Use Topics  . Alcohol use: No    Comment: Less than once a week.  . Drug use: No  Current Outpatient Medications  Medication Sig Dispense Refill  . albuterol (PROVENTIL HFA) 108 (90 Base) MCG/ACT inhaler Inhale 2 puffs into the lungs every 6 (six) hours as needed for wheezing or shortness of breath.     . cetirizine (ZYRTEC) 10 MG tablet Take 10 mg by mouth daily as needed for allergies.     . Cholecalciferol (VITAMIN D3) 50000 units CAPS Take 1 capsule by mouth once a week.    . diltiazem (CARDIZEM) 30 MG tablet Take 1 tablet (30 mg total) every 6 (six) hours as needed by mouth. For fast heart rate. Continue long acting diltiazem, total of no more than 360 mg per day 30 tablet 0  . flecainide (TAMBOCOR) 50 MG tablet Take 1 tablet (50 mg total) by mouth 2 (two) times daily. 180 tablet 3  .  furosemide (LASIX) 40 MG tablet As needed for weight gain 30 tablet 3  . Galcanezumab-gnlm 120 MG/ML SOAJ Inject 120 mg into the skin.     Marland Kitchen HYDROcodone-acetaminophen (NORCO) 7.5-325 MG tablet Take 0.5-1 tablets by mouth every 6 (six) hours as needed for moderate pain.     . hydrOXYzine (ATARAX/VISTARIL) 10 MG tablet Take 20 mg by mouth every 8 (eight) hours as needed (migraines).     . metoprolol tartrate (LOPRESSOR) 25 MG tablet Take 1 and 1/2 tabs twice daily = 37.5 mg twice daily 270 tablet 3  . Multiple Vitamin (MULTIVITAMIN) tablet Take 1 tablet by mouth 2 (two) times daily.     . Multiple Vitamins-Minerals (PRESERVISION AREDS 2 PO) Take 1 tablet by mouth 2 (two) times daily.     . ondansetron (ZOFRAN-ODT) 4 MG disintegrating tablet Take 4 mg by mouth every 8 (eight) hours as needed for nausea or vomiting.   0  . potassium chloride SA (K-DUR,KLOR-CON) 20 MEQ tablet As needed for weight gain with Lasix 30 tablet 3  . pramipexole (MIRAPEX) 1 MG tablet Take 1 mg by mouth 2 (two) times daily.     . promethazine (PHENERGAN) 12.5 MG tablet Take 1-2 tablets by mouth 3 (three) times daily as needed for nausea/vomiting.  5  . rivaroxaban (XARELTO) 20 MG TABS tablet Take 1 tablet (20 mg total) by mouth daily. 90 tablet 3  . SUMAtriptan 6 MG/0.5ML SOAJ Inject one at onset of severe headache; may repeat once in 2 hours if HA persists    . tiZANidine (ZANAFLEX) 2 MG tablet Take 2 mg by mouth every 6 (six) hours as needed (headache).   5  . acetaminophen (TYLENOL) 500 MG tablet Take 1,000 mg by mouth every 8 (eight) hours as needed for mild pain or headache.    . pantoprazole (PROTONIX) 40 MG tablet Take 40 mg by mouth daily.     Current Facility-Administered Medications  Medication Dose Route Frequency Provider Last Rate Last Dose  . methylPREDNISolone acetate (DEPO-MEDROL) injection 80 mg  80 mg Other Once Magnus Sinning, MD        Allergies  Allergen Reactions  . Other Anaphylaxis and Other (See  Comments)    HONEYDEW MELON    Review of Systems weight stable No shortness of breath No sternal incisional complaints Diastolic failure from long-standing hypertension from functional mitral stenosis with stable lower extremity edema  BP 132/82   Pulse 60   Resp 20   Ht 5\' 6"  (1.676 m)   Wt 225 lb (102.1 kg)   SpO2 98% Comment: RA  BMI 36.32 kg/m  Physical Exam Alert and comfortable  no JVD  lungs clear Heart rate regular No murmur Sternum well-healed   Diagnostic Tests: None today  Impression: Patient is now stable 1 year after resection of large left atrial myxoma with functional mitral stenosis.  She has no active surgical problems.  She has no restrictions on her activities from the operation.  Plan: She will return as needed.  Her cardiologist is Dr. Jenkins Rouge I discussed the importance of good dental hygiene for her heart health and would recommend antibiotic prophylaxis before any invasive dental procedures.   Len Childs, MD [facility] [phone]

## 2018-01-26 DIAGNOSIS — Z79899 Other long term (current) drug therapy: Secondary | ICD-10-CM | POA: Diagnosis not present

## 2018-01-26 DIAGNOSIS — G43709 Chronic migraine without aura, not intractable, without status migrainosus: Secondary | ICD-10-CM | POA: Diagnosis not present

## 2018-01-27 ENCOUNTER — Ambulatory Visit (INDEPENDENT_AMBULATORY_CARE_PROVIDER_SITE_OTHER): Payer: 59

## 2018-01-27 DIAGNOSIS — I480 Paroxysmal atrial fibrillation: Secondary | ICD-10-CM | POA: Diagnosis not present

## 2018-02-01 ENCOUNTER — Encounter: Payer: Self-pay | Admitting: Hematology

## 2018-02-01 ENCOUNTER — Telehealth: Payer: Self-pay | Admitting: Licensed Clinical Social Worker

## 2018-02-01 ENCOUNTER — Encounter: Payer: Self-pay | Admitting: Licensed Clinical Social Worker

## 2018-02-01 ENCOUNTER — Ambulatory Visit: Payer: Self-pay | Admitting: Licensed Clinical Social Worker

## 2018-02-01 DIAGNOSIS — Z1379 Encounter for other screening for genetic and chromosomal anomalies: Secondary | ICD-10-CM

## 2018-02-01 NOTE — Telephone Encounter (Signed)
Revealed negative genetic testing.  It is unlikely that there is an increased risk of cancer due to a mutation in one of these genes.  However, genetic testing is not perfect, and cannot definitively rule out a hereditary cause.  It will be important for her to keep in contact with genetics to learn if any additional testing may be needed in the future.  Additionally we discussed that she is high risk for breast cancer based on Harriett Rush and she wishes to be seen by Dr. Burr Medico for this if possible. We also discussed that her maternal relatives qualify for genetic counseling/testing as well.

## 2018-02-01 NOTE — Progress Notes (Addendum)
HPI:  Ms. Christo was previously seen in the [facility] on 01/19/2018 due to a family history of breast cancer and concerns regarding a hereditary predisposition to cancer. Please refer to our prior cancer genetics clinic note for more information regarding Ms. Preziosi's medical, social and family histories, and our assessment and recommendations, at the time. Ms. Raven recent genetic test results were disclosed to her, as well as recommendations warranted by these results. These results and recommendations are discussed in more detail below.  CANCER HISTORY:   No history exists.     FAMILY HISTORY:  We obtained a detailed, 4-generation family history.  Significant diagnoses are listed below: Family History  Problem Relation Age of Onset  . Thyroid cancer Mother   . Endometrial cancer Sister 104  . Breast cancer Maternal Aunt        dx > 50  . Breast cancer Maternal Grandmother        dx >50  . Breast cancer Maternal Aunt        dx  >50  . Depression Other        FMHx  . Stroke Paternal Grandfather    Ms. Peachey has two sons: [name], 20 and [place], [place]. She also has an adopted daughter, Marolyn Hammock, who is 19. Ms. Top had a sister who was diagnosed with endometrial cancer at 63, and died in an accident at 72.   Ms. Embry father is living at 19, he has a history of skin cancer. He had two brothers, no cancers for these individuals or Ms. Gist's paternal cousins. The patient's paternal grandmother died when she was older than 48, and grandfather died in his 1's.  Ms. Ochs mother was diagnosed with an "aggressive" thyroid cancer at 59 and died at 41. She had four sisters. Two of the patient's maternal aunts had breast cancer diagnosed when they were older than 63. They are both deceased. No cancers for maternal cousins. The patient's maternal grandmother also had breast cancer diagnosed when she was older, and died when she was older. Patient's grandfather died older  than 72.   Ms. Weitzman is unaware of previous family history of genetic testing for hereditary cancer risks. Patient's maternal ancestors are of caucasian descent, and paternal ancestors are of caucasian descent. There is no reported Ashkenazi Jewish ancestry. There is no known consanguinity.  GENETIC TEST RESULTS: Genetic testing performed through [facility]'s Multi-Cancer Panel reported out on 01/27/2018 showed no pathogenic mutations. The Multi-Cancer Panel offered by [facility] includes sequencing and/or deletion duplication testing of the following 84 genes: AIP, ALK, APC, ATM, AXIN2,BAP1,  BARD1, BLM, BMPR1A, BRCA1, BRCA2, BRIP1, CASR, CDC73, CDH1, CDK4, CDKN1B, CDKN1C, CDKN2A (p14ARF), CDKN2A (p16INK4a), CEBPA, CHEK2, CTNNA1, DICER1, DIS3L2, EGFR (c.2369C>T, p.Thr790Met variant only), EPCAM (Deletion/duplication testing only), FH, FLCN, GATA2, GPC3, GREM1 (Promoter region deletion/duplication testing only), HOXB13 (c.251G>A, p.Gly84Glu), HRAS, KIT, MAX, MEN1, MET, MITF (c.952G>A, p.Glu318Lys variant only), MLH1, MSH2, MSH3, MSH6, MUTYH, NBN, NF1, NF2, NTHL1, PALB2, PDGFRA, PHOX2B, PMS2, POLD1, POLE, POT1, PRKAR1A, PTCH1, PTEN, RAD50, RAD51C, RAD51D, RB1, RECQL4, RET, RUNX1, SDHAF2, SDHA (sequence changes only), SDHB, SDHC, SDHD, SMAD4, SMARCA4, SMARCB1, SMARCE1, STK11, SUFU, TERC, TERT, TMEM127, TP53, TSC1, TSC2, VHL, WRN and WT1.  The test report will be scanned into EPIC and will be located under the Molecular Pathology section of the Results Review tab. A portion of the result report is included below for reference.     We discussed with Ms. Crafton that because current genetic testing is not  perfect, it is possible there may be a gene mutation in one of these genes that current testing cannot detect, but that chance is small.  We also discussed, that there could be another gene that has not yet been discovered, or that we have not yet tested, that is responsible for the cancer diagnoses in the family. It  is also possible there is a hereditary cause for the cancer in the family that Ms. Cuyler did not inherit and therefore was not identified in her testing.  Therefore, it is important to remain in touch with cancer genetics in the future so that we can continue to offer Ms. Quimby the most up to date genetic testing.   ADDITIONAL GENETIC TESTING: We discussed with Ms. Sliker that her genetic testing was fairly extensive.  If there are are genes identified to increase cancer risk that can be analyzed in the future, we would be happy to discuss and coordinate this testing at that time.    CANCER SCREENING RECOMMENDATIONS: Ms. Bucaro test result is considered negative (normal).  This means that we have not identified a hereditary cause for her family history of cancer at this time.   While reassuring, this does not definitively rule out a hereditary predisposition to cancer. It is still possible that there could be genetic mutations that are undetectable by current technology, or genetic mutations in genes that have not been tested or identified to increase cancer risk.  Therefore, it is recommended she continue to follow the cancer management and screening guidelines provided by her oncology and primary healthcare provider. An individual's cancer risk is not determined by genetic test results alone.  Overall cancer risk assessment includes additional factors such as personal medical history, family history, etc.  These should be used to make a personalized plan for cancer prevention and surveillance.    Based on the Ms. Ludwig's family history of cancer, as well as her genetic test results, statistical model Harriett Rush was used to estimate her risk of developing breast cancer. This estimates her lifetime risk of developing breast cancer to be approximately 20.1%/ The patient's lifetime breast cancer risk is a preliminary estimate based on available information using one of several models endorsed by the  [place] (ACS). The ACS recommends consideration of breast MRI screening as an adjunct to mammography for patients at high risk (defined as 20% or greater lifetime risk). A more detailed breast cancer risk assessment can be considered, if clinically indicated.    Ms. Riveron has been determined to be at high risk for breast cancer.  Therefore, we recommend that annual screening with mammography and breast MRI begin at age 50, or 10 years prior to the age of breast cancer diagnosis in a relative (whichever is earlier).  We discussed that Ms. Roemmich should discuss her individual situation with her referring physician and determine a breast cancer screening plan with which they are both comfortable. She would like Dr. Burr Medico to follow her for this if possible.   RECOMMENDATIONS FOR FAMILY MEMBERS:  Relatives in this family might be at some increased risk of developing cancer, over the general population risk, simply due to the family history of cancer.  We recommended women in this family have a yearly mammogram beginning at age 39, or 63 years younger than the earliest onset of cancer, an annual clinical breast exam, and perform monthly breast self-exams. Women in this family should also have a gynecological exam as recommended by their primary provider. All family  members should have a colonoscopy by age 65 (or as directed by their doctors).  All family members should inform their physicians about the family history of cancer so their doctors can make the most appropriate screening recommendations for them.   It is also possible there is a hereditary cause for the cancer in Ms. Tapley's family that she did not inherit and therefore was not identified in her.  We recommended maternal relatives have genetic counseling and testing. Ms. Sami will let us know if we can be of any assistance in coordinating genetic counseling and/or testing for these family members.   FOLLOW-UP: Lastly, we discussed with  Ms. Zavaleta that cancer genetics is a rapidly advancing field and it is possible that new genetic tests will be appropriate for her and/or her family members in the future. We encouraged her to remain in contact with cancer genetics on an annual basis so we can update her personal and family histories and let her know of advances in cancer genetics that may benefit this family.   Our contact number was provided. Ms. Niznik questions were answered to her satisfaction, and she knows she is welcome to call us at anytime with additional questions or concerns.  Faith Rogue, MS Genetic Counselor [email or web address] Phone: [phone]

## 2018-02-08 DIAGNOSIS — I480 Paroxysmal atrial fibrillation: Secondary | ICD-10-CM | POA: Diagnosis not present

## 2018-02-23 ENCOUNTER — Telehealth: Payer: Self-pay | Admitting: *Deleted

## 2018-02-23 NOTE — Telephone Encounter (Signed)
Patient returned call she said she can pick more stickers tomorrow afternoon, and if you can extend her length of time for the  Monitoring.

## 2018-02-23 NOTE — Telephone Encounter (Signed)
Error in estimated End of Service date.  Anticipated new End of Service date would by 03/10/18.

## 2018-02-23 NOTE — Telephone Encounter (Signed)
Sent phone message to [employer] and contact message to [employer] to report patient had called [employer] twice to request more patches and never received them.  Informed patient, I will leave 6 patches at our front desk for her to pick up tomorrow.  I have asked [employer] to extend her service 11 days to make up for days she did not have supplies.  Her last recording was 02/12/18.  Adding 11 days would change her End Of Service date to 03/18/2018.  I have also asked [employer] to reach out to her regarding her sensor battery depleting so quickly.  This may be defective, since a sensor should last 3-5 days between charges.  I will let her know if there are any additional changes.

## 2018-02-23 NOTE — Telephone Encounter (Signed)
Have 3 options regarding lack of stickers for monitor.  1) I can contact company and have stickers sent to you and have them extend monitor for time you did not have supplies, 2) you can pick up a couple of stickers at our office, or 3)  Your anticipated end of service is 02/26/18, you had considered just sending it back prematurely. Please let me know how you would like to proceed.  Have the operator leave a telephone message for Melissa [name]/ [employer] at [phone]. Thank you.

## 2018-02-27 ENCOUNTER — Ambulatory Visit: Payer: 59 | Admitting: Physician Assistant

## 2018-03-02 ENCOUNTER — Ambulatory Visit: Payer: 59 | Admitting: Cardiovascular Disease

## 2018-04-10 NOTE — Progress Notes (Signed)
Cardiology Office Note:    Date:  04/12/2018   ID:  Melissa, [name] November 10, 1965, MRN [id number]  PCP:  Maurice Small, MD  Cardiologist:  Jenkins Rouge, MD   Electrophysiologist:  None   Referring MD: Maurice Small, MD   No chief complaint on file.    History of Present Illness:    Melissa [name] is a 53 y.o. female   She underewent resection of the atrial myxoma with atrial septal pericardial patch in 02/4579 complicated by post op atrial fibrillation.  She has been continued on anticoagulation with Rivaroxaban.  PASP has returned to normal since her resection.   She has an apple watch and has had some bouts of PAF and PAC;s since surgery    Most recent monitor done 01/27/18 showed no PAF. TEE 05/17/17 with intact septal patch and no recurrent atrial myxoma  Limited palpitations only at night when she first lays down Has not had to take PRN cardizem   Son [name] transferred to [facility] and now [place] wants to be a marshal Younger son likely going to [place] or [place]   Prior CV studies:   The following studies were reviewed today:  TEE 05/17/17 EF 55-60, trivial MR, no residual atrial myxoma, BAE, small PFO, pericardial septal patch intact, bubble study negative  48 Hr Holter 02/08/17 Sinus rhythm with rare PACs and PVCs. Single 4 beat atrial run. No atrial fibrillation or other sustained arrhythmia.  Echo 02/07/17 EF 55-60, no RWMA, mild LAE, neg bubble study, mild TR, normal PASP  TEE 01/10/17 EF 55-60, no RWMA, trivial AI, mild MR, severe BAE, severe TR, PASP 48, interatrial patch significantly thickened with possible dehiscence and thrombus  R/L heart cath 12/27/16 LM normal LAD min irregs LCx ost 10 RCA normal  RA (mean): 11 mmHg RV (S/EDP): 71/13 mmHg PA (S/D, mean): 68/33 (47) mmHg PCWP (mean): 27 mmHg Ao sat: 99% PA sat: 73% Fick CO: 7.9 L/min Fick CI: 3.7 L/min/m^2 PVR: 2.5 Wood units Conclusions: 1. Minimal atherosclerotic plaquing; no  angiographically significant coronary artery disease. 2. Hypervascular, mobile structure supplied predominantly by the right coronary artery, consistent with left atrial mass seen on recent echo. 3. Moderately elevated left and right heart filling pressures. 4. Severe pulmonary hypertension. 5. Normal Fick cardiac output/index.  PreCABG Dopplers 12/27/16 1. Carotid Doppler Evaluation - findings are suggestive of 1-39%  diameter reduction bilaterally. There is moderate tortuosity of  the left internal carotid artery noted.  Echo 12/24/16 EF 55-60, no RWMA, mod MR, MV gradients severely elevated due to L atrial mass (mean 20), severea LAE, larg (2.9 x 4.5 cm) mass, mod TR, PASP 89  Past Medical History:  Diagnosis Date  . (HFpEF) heart failure with preserved ejection fraction ([place])   . ADD (attention deficit disorder)   . Allergy   . Arthritis   . Asthma   . 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 Depression   . Family history of breast cancer   . Family history of thyroid cancer   . Family history of uterine cancer   . Migraine   . Osteoporosis   . Pulmonary hypertension ([place])   . RLS (restless legs syndrome)    Surgical Hx: The patient  has a past surgical history that includes Cholecystectomy; Tubal ligation; Abdominal hysterectomy; Foot surgery; Total knee arthroplasty; Gastric bypass; Excision of atrial myxoma (Left, 12/28/2016); Intraoprative transesophageal echocardiogram (N/A, 12/28/2016); TEE without cardioversion (N/A, 01/10/2017); Cardioversion (N/A, 01/10/2017); and TEE without cardioversion (N/A, 05/17/2017).   Current  Medications: Current Meds  Medication Sig  . acetaminophen (TYLENOL) 500 MG tablet Take 1,000 mg by mouth every 8 (eight) hours as needed for mild pain or headache.  . albuterol (PROVENTIL HFA) 108 (90 Base) MCG/ACT inhaler Inhale 2 puffs into the lungs every 6 (six) hours as needed for wheezing or shortness of breath.   . cetirizine (ZYRTEC) 10 MG tablet Take 10 mg  by mouth daily as needed for allergies.   . Cholecalciferol (VITAMIN D3) 50000 units CAPS Take 1 capsule by mouth once a week.  . flecainide (TAMBOCOR) 50 MG tablet Take 1 tablet (50 mg total) by mouth 2 (two) times daily.  . furosemide (LASIX) 40 MG tablet As needed for weight gain  . Galcanezumab-gnlm 120 MG/ML SOAJ Inject 120 mg into the skin.   Marland Kitchen HYDROcodone-acetaminophen (NORCO) 7.5-325 MG tablet Take 0.5-1 tablets by mouth every 6 (six) hours as needed for moderate pain.   . hydrOXYzine (ATARAX/VISTARIL) 10 MG tablet Take 20 mg by mouth every 8 (eight) hours as needed (migraines).   . metoprolol tartrate (LOPRESSOR) 25 MG tablet Take 1 and 1/2 tabs twice daily = 37.5 mg twice daily  . Multiple Vitamin (MULTIVITAMIN) tablet Take 1 tablet by mouth 2 (two) times daily.   . Multiple Vitamins-Minerals (PRESERVISION AREDS 2 PO) Take 1 tablet by mouth 2 (two) times daily.   . ondansetron (ZOFRAN-ODT) 4 MG disintegrating tablet Take 4 mg by mouth every 8 (eight) hours as needed for nausea or vomiting.   . potassium chloride SA (K-DUR,KLOR-CON) 20 MEQ tablet As needed for weight gain with Lasix  . pramipexole (MIRAPEX) 1 MG tablet Take 1 mg by mouth 2 (two) times daily.   . promethazine (PHENERGAN) 12.5 MG tablet Take 1-2 tablets by mouth 3 (three) times daily as needed for nausea/vomiting.  . rivaroxaban (XARELTO) 20 MG TABS tablet Take 1 tablet (20 mg total) by mouth daily.  . SUMAtriptan 6 MG/0.5ML SOAJ Inject one at onset of severe headache; may repeat once in 2 hours if HA persists  . tiZANidine (ZANAFLEX) 2 MG tablet Take 2 mg by mouth every 6 (six) hours as needed (headache).   . [DISCONTINUED] pantoprazole (PROTONIX) 40 MG tablet Take 40 mg by mouth daily.   Current Facility-Administered Medications for the 04/12/18 encounter (Office Visit) with Josue Hector, MD  Medication  . methylPREDNISolone acetate (DEPO-MEDROL) injection 80 mg     Allergies:   Other   Social History   Tobacco  Use  . Smoking status: Former Smoker    Packs/day: 0.25    Years: 10.00    Pack years: 2.50    Types: Cigarettes    Last attempt to quit: 1997    Years since quitting: 23.0  . Smokeless tobacco: Never Used  Substance Use Topics  . Alcohol use: No    Comment: Less than once a week.  . Drug use: No     Family Hx: The patient's family history includes Breast cancer in her maternal aunt, maternal aunt, and maternal grandmother; Depression in an other family member; Endometrial cancer (age of onset: 41) in her sister; Stroke in her paternal grandfather; Thyroid cancer in her mother.  ROS:   Please see the history of present illness.    Review of Systems  Constitution: Positive for diaphoresis.  Cardiovascular: Positive for irregular heartbeat.  Musculoskeletal: Positive for back pain and myalgias.  Neurological: Positive for headaches.   All other systems reviewed and are negative.   EKGs/Labs/Other Test Reviewed:  EKG:   01/02/18 SR rate 63 nonspecific ST changes   Recent Labs: 11/14/2017: ALT 23; Hemoglobin 12.5; Platelet Count 232 01/02/2018: BUN 11; Creatinine, Ser 0.60; Magnesium 2.5; Potassium 4.2; Sodium 143; TSH 0.995   Recent Lipid Panel Lab Results  Component Value Date/Time   CHOL 145 06/21/2012 04:05 PM   TRIG 36.0 06/21/2012 04:05 PM   HDL 64.60 06/21/2012 04:05 PM   CHOLHDL 2 06/21/2012 04:05 PM   LDLCALC 73 06/21/2012 04:05 PM   LDLDIRECT 111.7 07/08/2008 08:39 AM    Physical Exam:    VS:  BP 138/80   Pulse 97   Ht 5\' 6"  (1.676 m)   Wt 250 lb 3.2 oz (113.5 kg)   SpO2 97%   BMI 40.38 kg/m     Wt Readings from Last 3 Encounters:  04/12/18 250 lb 3.2 oz (113.5 kg)  01/25/18 225 lb (102.1 kg)  01/02/18 225 lb 6.4 oz (102.2 kg)     Affect appropriate Overweight white female  HEENT: normal Neck supple with no adenopathy JVP normal no bruits no thyromegaly Lungs clear with no wheezing and good diaphragmatic motion Heart:  S1/S2  1/6/ SEM   murmur, no rub, gallop or click PMI normal post sternotomy  Abdomen: benighn, BS positve, no tenderness, no AAA no bruit.  No HSM or HJR Distal pulses intact with no bruits No edema Neuro non-focal Skin warm and dry No muscular weakness   ASSESSMENT & PLAN:    Paroxysmal atrial fibrillation (HCC) Continue flecainide, beta blocker seems improved   History of atrial myxoma S/p resection.  Transesophageal echocardiogram in 04/2017 demonstrated intact pericardial septal patch and no residual myxoma.  Chronic heart failure with preserved ejection fraction (HCC) Overall, volume status stable.  Lasix as needed  .     Dispo:  F/U 6 months   Jenkins Rouge

## 2018-04-12 ENCOUNTER — Ambulatory Visit: Payer: 59 | Admitting: Cardiovascular Disease

## 2018-04-12 ENCOUNTER — Encounter: Payer: Self-pay | Admitting: Cardiovascular Disease

## 2018-04-12 VITALS — BP 138/80 | HR 97 | Ht 66.0 in | Wt 250.2 lb

## 2018-04-12 DIAGNOSIS — I5032 Chronic diastolic (congestive) heart failure: Secondary | ICD-10-CM

## 2018-04-12 DIAGNOSIS — Z86018 Personal history of other benign neoplasm: Secondary | ICD-10-CM | POA: Diagnosis not present

## 2018-04-12 DIAGNOSIS — I480 Paroxysmal atrial fibrillation: Secondary | ICD-10-CM | POA: Diagnosis not present

## 2018-04-12 NOTE — Patient Instructions (Addendum)

## 2018-04-17 NOTE — Progress Notes (Signed)
[place]   [phone] [phone]   Clinic Follow up Note   Patient Care Team: Maurice Small, MD as PCP - General (Family Medicine) Josue Hector, MD as PCP - Cardiology (Cardiology)  Date of Service:  04/19/2018  CHIEF COMPLAINT: F/u of iron deficient anemia   CURRENT THERAPY:  iv feraheme as needed (if ferritin<20 or low iron level)   INTERVAL HISTORY:  Melissa [name] is here for a follow up of IDA. She was last seen by me 5 months ago. She presents to the clinic today alone. She notes she has trouble sleeping which makes her tired. She notes she has completed a sleep study before.  She notes she does not take oral iron regularly. I reviewed her medication list. She denies any bleeding. She had 3 maternal family members with breast cancer. She notes she completed Genetic testing which was negative. She plans to have next mammogram in 04/2018.    REVIEW OF SYSTEMS:   Constitutional: Denies fevers, chills or abnormal weight loss (+) trouble sleeping, tiredness  Eyes: Denies blurriness of vision Ears, nose, mouth, throat, and face: Denies mucositis or sore throat Respiratory: Denies cough, dyspnea or wheezes Cardiovascular: Denies palpitation, chest discomfort or lower extremity swelling Gastrointestinal:  Denies nausea, heartburn or change in bowel habits Skin: Denies abnormal skin rashes Lymphatics: Denies new lymphadenopathy or easy bruising Neurological:Denies numbness, tingling or new weaknesses Behavioral/Psych: Mood is stable, no new changes  All other systems were reviewed with the patient and are negative.  MEDICAL HISTORY:  Past Medical History:  Diagnosis Date  . (HFpEF) heart failure with preserved ejection fraction ([place])   . ADD (attention deficit disorder)   . Allergy   . Arthritis   . Asthma   . 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 Depression   . Family history of breast cancer   . Family history of thyroid cancer   . Family  history of uterine cancer   . Migraine   . Osteoporosis   . Pulmonary hypertension ([place])   . RLS (restless legs syndrome)     SURGICAL HISTORY: Past Surgical History:  Procedure Laterality Date  . ABDOMINAL HYSTERECTOMY    . CARDIOVERSION N/A 01/10/2017   Procedure: CARDIOVERSION;  Surgeon: Dorothy Spark, MD;  Location: [facility] ENDOSCOPY;  Service: Cardiovascular;  Laterality: N/A;  . CHOLECYSTECTOMY    . EXCISION OF ATRIAL MYXOMA Left 12/28/2016   Procedure: EXCISION OF LEFT ATRIAL MYXOMA;  Surgeon: Ivin Poot, MD;  Location: [place];  Service: Open Heart Surgery;  Laterality: Left;  . FOOT SURGERY    . GASTRIC BYPASS    . INTRAOPERATIVE TRANSESOPHAGEAL ECHOCARDIOGRAM N/A 12/28/2016   Procedure: INTRAOPERATIVE TRANSESOPHAGEAL ECHOCARDIOGRAM;  Surgeon: Ivin Poot, MD;  Location: [place];  Service: Open Heart Surgery;  Laterality: N/A;  . TEE WITHOUT CARDIOVERSION N/A 01/10/2017   Procedure: TRANSESOPHAGEAL ECHOCARDIOGRAM (TEE);  Surgeon: Dorothy Spark, MD;  Location[facility] ENDOSCOPY;  Service: Cardiovascular;  Laterality: N/A;  . TEE WITHOUT CARDIOVERSION N/A 05/17/2017   Procedure: TRANSESOPHAGEAL ECHOCARDIOGRAM (TEE);  Surgeon: Josue Hector, MD;  Location: [facility] ENDOSCOPY;  Service: Cardiovascular;  Laterality: N/A;  . TOTAL KNEE ARTHROPLASTY    . TUBAL LIGATION      I have reviewed the social history and family history with the patient and they are unchanged from previous note.  ALLERGIES:  is allergic to other.  MEDICATIONS:  Current Outpatient Medications  Medication Sig Dispense Refill  . acetaminophen (TYLENOL) 500 MG  tablet Take 1,000 mg by mouth every 8 (eight) hours as needed for mild pain or headache.    . albuterol (PROVENTIL HFA) 108 (90 Base) MCG/ACT inhaler Inhale 2 puffs into the lungs every 6 (six) hours as needed for wheezing or shortness of breath.     . cetirizine (ZYRTEC) 10 MG tablet Take 10 mg by mouth daily as needed for allergies.     .  Cholecalciferol (VITAMIN D3) 50000 units CAPS Take 1 capsule by mouth once a week.    . flecainide (TAMBOCOR) 50 MG tablet Take 1 tablet (50 mg total) by mouth 2 (two) times daily. 180 tablet 3  . furosemide (LASIX) 40 MG tablet As needed for weight gain 30 tablet 3  . Galcanezumab-gnlm 120 MG/ML SOAJ Inject 120 mg into the skin.     Marland Kitchen HYDROcodone-acetaminophen (NORCO) 7.5-325 MG tablet Take 0.5-1 tablets by mouth every 6 (six) hours as needed for moderate pain.     . hydrOXYzine (ATARAX/VISTARIL) 10 MG tablet Take 20 mg by mouth every 8 (eight) hours as needed (migraines).     . metoprolol tartrate (LOPRESSOR) 25 MG tablet Take 1 and 1/2 tabs twice daily = 37.5 mg twice daily 270 tablet 3  . Multiple Vitamin (MULTIVITAMIN) tablet Take 1 tablet by mouth 2 (two) times daily.     . Multiple Vitamins-Minerals (PRESERVISION AREDS 2 PO) Take 1 tablet by mouth 2 (two) times daily.     . ondansetron (ZOFRAN-ODT) 4 MG disintegrating tablet Take 4 mg by mouth every 8 (eight) hours as needed for nausea or vomiting.   0  . potassium chloride SA (K-DUR,KLOR-CON) 20 MEQ tablet As needed for weight gain with Lasix 30 tablet 3  . pramipexole (MIRAPEX) 1 MG tablet Take 1 mg by mouth 2 (two) times daily.     . promethazine (PHENERGAN) 12.5 MG tablet Take 1-2 tablets by mouth 3 (three) times daily as needed for nausea/vomiting.  5  . rivaroxaban (XARELTO) 20 MG TABS tablet Take 1 tablet (20 mg total) by mouth daily. 90 tablet 3  . SUMAtriptan 6 MG/0.5ML SOAJ Inject one at onset of severe headache; may repeat once in 2 hours if HA persists    . tiZANidine (ZANAFLEX) 2 MG tablet Take 2 mg by mouth every 6 (six) hours as needed (headache).   5  . diltiazem (CARDIZEM) 30 MG tablet Take 1 tablet (30 mg total) every 6 (six) hours as needed by mouth. For fast heart rate. Continue long acting diltiazem, total of no more than 360 mg per day 30 tablet 0   Current Facility-Administered Medications  Medication Dose Route  Frequency Provider Last Rate Last Dose  . methylPREDNISolone acetate (DEPO-MEDROL) injection 80 mg  80 mg Other Once Magnus Sinning, MD        PHYSICAL EXAMINATION: ECOG PERFORMANCE STATUS: 0 - Asymptomatic  Vitals:   04/19/18 0842  BP: 125/78  Pulse: 63  Resp: 20  Temp: 98.7 F (37.1 C)  SpO2: 100%   Filed Weights   04/19/18 0842  Weight: 242 lb 11.2 oz (110.1 kg)    GENERAL:alert, no distress and comfortable SKIN: skin color, texture, turgor are normal, no rashes or significant lesions EYES: normal, Conjunctiva are pink and non-injected, sclera clear OROPHARYNX:no exudate, no erythema and lips, buccal mucosa, and tongue normal  NECK: supple, thyroid normal size, non-tender, without nodularity LYMPH:  no palpable lymphadenopathy in the cervical, axillary or inguinal LUNGS: clear to auscultation and percussion with normal breathing effort HEART: regular  rate & rhythm and no murmurs and no lower extremity edema ABDOMEN:abdomen soft, non-tender and normal bowel sounds Musculoskeletal:no cyanosis of digits and no clubbing  NEURO: alert & oriented x 3 with fluent speech, no focal motor/sensory deficits  LABORATORY DATA:  I have reviewed the data as listed CBC Latest Ref Rng & Units 04/19/2018 11/14/2017 08/10/2017  WBC 4.0 - 10.5 K/uL 5.6 6.6 9.0  Hemoglobin 12.0 - 15.0 g/dL 13.4 12.5 11.0(L)  Hematocrit 36.0 - 46.0 % 41.6 37.4 36.1  Platelets 150 - 400 K/uL 303 232 302     CMP Latest Ref Rng & Units 04/19/2018 01/02/2018 11/14/2017  Glucose 70 - 99 mg/dL 89 90 76  BUN 6 - 20 mg/dL 10 11 8   Creatinine 0.44 - 1.00 mg/dL 0.75 0.60 0.65  Sodium 135 - 145 mmol/L 141 143 141  Potassium 3.5 - 5.1 mmol/L 4.5 4.2 3.8  Chloride 98 - 111 mmol/L 105 102 105  CO2 22 - 32 mmol/L 26 22 29   Calcium 8.9 - 10.3 mg/dL 9.8 9.6 8.9  Total Protein 6.5 - 8.1 g/dL 7.6 - 6.9  Total Bilirubin 0.3 - 1.2 mg/dL 0.6 - 0.7  Alkaline Phos 38 - 126 U/L 73 - 86  AST 15 - 41 U/L 22 - 21  ALT 0 - 44  U/L 22 - 23      RADIOGRAPHIC STUDIES: I have personally reviewed the radiological images as listed and agreed with the findings in the report. No results found.   ASSESSMENT & PLAN:  Melissa [name] is a 53 y.o. female with   1. Iron Deficiency Anemia, secondary to gastric by-pass surgery  -She has chronic mild anemia, she had gastric by pass surgery, and a revision again in July 2019.  Her lab work is consistent with iron deficiency. I discussed her iron deficiency anemia is likely related to poor iron absorption after the gastric surgery. -Patient has been having tiredness and fatigue, but attributes this to her trouble sleeping.  -Labs reviewed, CBC WNL with Hg normalized at 13.4 today. CMP and Iron panel is still pending. She has not needed IV iron since 07/30/27.  -Will monitor her labs every 4 months and give IV iron as needed.  -F/u in 1 year   2. Persistent atrial fibrillation -Rare palpitations reported by Ms. Marrocco but no symptoms to suggest significant recurrence of atrial fibrillation.  -She remains on metoprolol tartrate and rivaroxaban she has diltiazem to take as needed for symptoms. [name] deferred medication changes at this time.  -Continue to follow-up with [name] and continue rivaroxaban   3. Left atrial myxoma status post resection -Other than anepisode of leg edema in April 2019, Ms. Candler has done very well without symptoms to suggest recurrent heart failure which was her presenting clinical picture from her myxoma.  -She appears euvolemic and well compensated on previous exam .  -[name] will repeat TEE in the February showed resolution of aneurysmal dilation of the atrial septal patch and adjacent thrombus.  4.  Family history of malignancy, genetic testing was normal -She is scheduled to have screening mammogram in 04/2018.  -Given her significant family history of breast cancer I discussed her option for further screening with breast MRI in between  each mammogram. She is interested.  -Will schedule breast MRIs in 10/2018.   PLAN: Lab every 4 months X3 F/u in one year     No problem-specific Assessment & Plan notes found for this encounter.   Orders Placed  This Encounter  Procedures  . MR BREAST BILATERAL W WO CONTRAST INC CAD    Standing Status:   Future    Standing Expiration Date:   06/18/2019    Order Specific Question:   If indicated for the ordered procedure, I authorize the administration of contrast media per Radiology protocol    Answer:   Yes    Order Specific Question:   What is the patient's sedation requirement?    Answer:   No Sedation    Order Specific Question:   Does the patient have a pacemaker or implanted devices?    Answer:   No    Order Specific Question:   Radiology Contrast Protocol - do NOT remove file path    Answer:   [email or web address]    Order Specific Question:   Preferred imaging location?    Answer:   [facility] (table limit-550lbs)   All questions were answered. The patient knows to call the clinic with any problems, questions or concerns. No barriers to learning was detected. I spent 15 minutes counseling the patient face to face. The total time spent in the appointment was 20 minutes and more than 50% was on counseling and review of test results     Truitt Merle, MD 04/19/2018   I, Joslyn Devon, am acting as scribe for Truitt Merle, MD.   I have reviewed the above documentation for accuracy and completeness, and I agree with the above.

## 2018-04-19 ENCOUNTER — Other Ambulatory Visit: Payer: Self-pay

## 2018-04-19 ENCOUNTER — Telehealth: Payer: Self-pay

## 2018-04-19 ENCOUNTER — Encounter: Payer: Self-pay | Admitting: Hematology

## 2018-04-19 ENCOUNTER — Inpatient Hospital Stay: Payer: 59 | Attending: Hematology

## 2018-04-19 ENCOUNTER — Inpatient Hospital Stay: Payer: 59 | Admitting: Hematology

## 2018-04-19 VITALS — BP 125/78 | HR 63 | Temp 98.7°F | Resp 20 | Ht 66.0 in | Wt 242.7 lb

## 2018-04-19 DIAGNOSIS — I4819 Other persistent atrial fibrillation: Secondary | ICD-10-CM | POA: Insufficient documentation

## 2018-04-19 DIAGNOSIS — R5383 Other fatigue: Secondary | ICD-10-CM | POA: Diagnosis not present

## 2018-04-19 DIAGNOSIS — M129 Arthropathy, unspecified: Secondary | ICD-10-CM | POA: Insufficient documentation

## 2018-04-19 DIAGNOSIS — G2581 Restless legs syndrome: Secondary | ICD-10-CM

## 2018-04-19 DIAGNOSIS — I5032 Chronic diastolic (congestive) heart failure: Secondary | ICD-10-CM | POA: Diagnosis not present

## 2018-04-19 DIAGNOSIS — J45909 Unspecified asthma, uncomplicated: Secondary | ICD-10-CM | POA: Diagnosis not present

## 2018-04-19 DIAGNOSIS — Z7901 Long term (current) use of anticoagulants: Secondary | ICD-10-CM

## 2018-04-19 DIAGNOSIS — Z809 Family history of malignant neoplasm, unspecified: Secondary | ICD-10-CM | POA: Insufficient documentation

## 2018-04-19 DIAGNOSIS — F329 Major depressive disorder, single episode, unspecified: Secondary | ICD-10-CM

## 2018-04-19 DIAGNOSIS — Z803 Family history of malignant neoplasm of breast: Secondary | ICD-10-CM

## 2018-04-19 DIAGNOSIS — M810 Age-related osteoporosis without current pathological fracture: Secondary | ICD-10-CM | POA: Diagnosis not present

## 2018-04-19 DIAGNOSIS — Z79899 Other long term (current) drug therapy: Secondary | ICD-10-CM | POA: Insufficient documentation

## 2018-04-19 DIAGNOSIS — F988 Other specified behavioral and emotional disorders with onset usually occurring in childhood and adolescence: Secondary | ICD-10-CM | POA: Insufficient documentation

## 2018-04-19 DIAGNOSIS — I272 Pulmonary hypertension, unspecified: Secondary | ICD-10-CM | POA: Diagnosis not present

## 2018-04-19 DIAGNOSIS — D509 Iron deficiency anemia, unspecified: Secondary | ICD-10-CM

## 2018-04-19 LAB — CMP (CANCER CENTER ONLY)
ALT: 22 U/L (ref 0–44)
AST: 22 U/L (ref 15–41)
Albumin: 4 g/dL (ref 3.5–5.0)
Alkaline Phosphatase: 73 U/L (ref 38–126)
Anion gap: 10 (ref 5–15)
BUN: 10 mg/dL (ref 6–20)
CO2: 26 mmol/L (ref 22–32)
CREATININE: 0.75 mg/dL (ref 0.44–1.00)
Calcium: 9.8 mg/dL (ref 8.9–10.3)
Chloride: 105 mmol/L (ref 98–111)
GFR, Est AFR Am: >60 mL/min (ref >60)
GFR, Estimated: >60 mL/min (ref >60)
Glucose, Bld: 89 mg/dL (ref 70–99)
Potassium: 4.5 mmol/L (ref 3.5–5.1)
Sodium: 141 mmol/L (ref 135–145)
Total Bilirubin: 0.6 mg/dL (ref 0.3–1.2)
Total Protein: 7.6 g/dL (ref 6.5–8.1)

## 2018-04-19 LAB — FERRITIN: Ferritin: 11 ng/mL (ref 11–307)

## 2018-04-19 LAB — CBC WITH DIFFERENTIAL (CANCER CENTER ONLY)
Abs Immature Granulocytes: 0.03 10*3/uL (ref 0.00–0.07)
Basophils Absolute: 0.1 10*3/uL (ref 0.0–0.1)
Basophils Relative: 1 %
EOS ABS: 0.2 10*3/uL (ref 0.0–0.5)
Eosinophils Relative: 3 %
HCT: 41.6 % (ref 36.0–46.0)
Hemoglobin: 13.4 g/dL (ref 12.0–15.0)
Immature Granulocytes: 1 %
Lymphocytes Relative: 30 %
Lymphs Abs: 1.7 10*3/uL (ref 0.7–4.0)
MCH: 28.4 pg (ref 26.0–34.0)
MCHC: 32.2 g/dL (ref 30.0–36.0)
MCV: 88.1 fL (ref 80.0–100.0)
Monocytes Absolute: 0.5 10*3/uL (ref 0.1–1.0)
Monocytes Relative: 9 %
Neutro Abs: 3.2 10*3/uL (ref 1.7–7.7)
Neutrophils Relative %: 56 %
Platelet Count: 303 10*3/uL (ref 150–400)
RBC: 4.72 MIL/uL (ref 3.87–5.11)
RDW: 13.7 % (ref 11.5–15.5)
WBC Count: 5.6 10*3/uL (ref 4.0–10.5)
nRBC: 0 % (ref 0.0–0.2)

## 2018-04-19 LAB — IRON AND TIBC
Iron: 73 ug/dL (ref 41–142)
Saturation Ratios: 13 % — ABNORMAL LOW (ref 21–57)
TIBC: 543 ug/dL — ABNORMAL HIGH (ref 236–444)
UIBC: 470 ug/dL — ABNORMAL HIGH (ref 120–384)

## 2018-04-19 NOTE — Telephone Encounter (Signed)
Printed avs and calender of upcoming appointment. Per 1/29 los 

## 2018-04-25 ENCOUNTER — Telehealth: Payer: Self-pay

## 2018-04-25 NOTE — Telephone Encounter (Signed)
Spoke with patient regarding lab results, iron level mildly low per Dr. Burr Medico, recommend getting IV iron x 1, patient verbalized an understanding and is in agreement.  Sent scheduling message.

## 2018-04-25 NOTE — Telephone Encounter (Signed)
-----   Message from Truitt Merle, MD sent at 04/19/2018 10:49 PM EST ----- Please let pt know her lab results, iron level mildly low, please set up one dose IV feraheme for her, thanks   Truitt Merle  04/19/2018

## 2018-04-26 ENCOUNTER — Telehealth: Payer: Self-pay | Admitting: Hematology

## 2018-04-26 NOTE — Telephone Encounter (Signed)
Patient called back returning a call to schedule an appt for iron.  Scheduled appt with patient and patient is aware of date and time.

## 2018-04-26 NOTE — Telephone Encounter (Signed)
Called patient per 2/4 sch message to set up iv iron - no answer - left message for patient to call back to set up appt.

## 2018-05-01 ENCOUNTER — Other Ambulatory Visit: Payer: Self-pay | Admitting: Hematology

## 2018-05-01 DIAGNOSIS — Z1231 Encounter for screening mammogram for malignant neoplasm of breast: Secondary | ICD-10-CM | POA: Diagnosis not present

## 2018-05-01 DIAGNOSIS — Z803 Family history of malignant neoplasm of breast: Secondary | ICD-10-CM | POA: Diagnosis not present

## 2018-05-02 ENCOUNTER — Other Ambulatory Visit: Payer: Self-pay | Admitting: Hematology

## 2018-05-03 ENCOUNTER — Inpatient Hospital Stay: Payer: 59 | Attending: Hematology

## 2018-05-03 VITALS — BP 121/81 | HR 75 | Temp 98.3°F | Resp 17

## 2018-05-03 DIAGNOSIS — D509 Iron deficiency anemia, unspecified: Secondary | ICD-10-CM | POA: Insufficient documentation

## 2018-05-03 DIAGNOSIS — D508 Other iron deficiency anemias: Secondary | ICD-10-CM

## 2018-05-03 MED ORDER — SODIUM CHLORIDE 0.9 % IV SOLN
510.0000 mg | Freq: Once | INTRAVENOUS | Status: AC
  Administered 2018-05-03: 510 mg via INTRAVENOUS
  Filled 2018-05-03: qty 17

## 2018-05-03 NOTE — Patient Instructions (Signed)

## 2018-05-15 DIAGNOSIS — E611 Iron deficiency: Secondary | ICD-10-CM | POA: Diagnosis not present

## 2018-05-15 DIAGNOSIS — Z136 Encounter for screening for cardiovascular disorders: Secondary | ICD-10-CM | POA: Diagnosis not present

## 2018-05-15 DIAGNOSIS — Z5181 Encounter for therapeutic drug level monitoring: Secondary | ICD-10-CM | POA: Diagnosis not present

## 2018-05-15 DIAGNOSIS — E559 Vitamin D deficiency, unspecified: Secondary | ICD-10-CM | POA: Diagnosis not present

## 2018-05-26 DIAGNOSIS — R1013 Epigastric pain: Secondary | ICD-10-CM | POA: Diagnosis not present

## 2018-05-29 ENCOUNTER — Ambulatory Visit
Admission: RE | Admit: 2018-05-29 | Discharge: 2018-05-29 | Disposition: A | Discharge status: Home / Self Care | Payer: 59 | Source: Ambulatory Visit | Attending: Family Medicine | Admitting: Family Medicine

## 2018-05-29 ENCOUNTER — Other Ambulatory Visit: Payer: Self-pay | Admitting: Family Medicine

## 2018-05-29 DIAGNOSIS — R7989 Other specified abnormal findings of blood chemistry: Secondary | ICD-10-CM

## 2018-05-29 DIAGNOSIS — R945 Abnormal results of liver function studies: Principal | ICD-10-CM

## 2018-05-29 DIAGNOSIS — R748 Abnormal levels of other serum enzymes: Secondary | ICD-10-CM | POA: Diagnosis not present

## 2018-05-29 DIAGNOSIS — R1010 Upper abdominal pain, unspecified: Secondary | ICD-10-CM | POA: Diagnosis not present

## 2018-05-29 MED ORDER — IOPAMIDOL (ISOVUE-300) INJECTION 61%
125.0000 mL | Freq: Once | INTRAVENOUS | Status: AC | PRN
  Administered 2018-05-29: 125 mL via INTRAVENOUS

## 2018-06-29 DIAGNOSIS — M542 Cervicalgia: Secondary | ICD-10-CM | POA: Diagnosis not present

## 2018-06-29 DIAGNOSIS — G43709 Chronic migraine without aura, not intractable, without status migrainosus: Secondary | ICD-10-CM | POA: Diagnosis not present

## 2018-07-05 DIAGNOSIS — G43709 Chronic migraine without aura, not intractable, without status migrainosus: Secondary | ICD-10-CM | POA: Diagnosis not present

## 2018-07-05 DIAGNOSIS — M542 Cervicalgia: Secondary | ICD-10-CM | POA: Diagnosis not present

## 2018-08-09 DIAGNOSIS — G43709 Chronic migraine without aura, not intractable, without status migrainosus: Secondary | ICD-10-CM | POA: Diagnosis not present

## 2018-08-15 ENCOUNTER — Telehealth: Payer: Self-pay | Admitting: Hematology

## 2018-08-15 NOTE — Telephone Encounter (Signed)
Called patient per 5/26 sch message - no answer - left message for patient to call back to reschedule

## 2018-08-16 ENCOUNTER — Inpatient Hospital Stay: Payer: 59 | Attending: Hematology

## 2018-11-14 ENCOUNTER — Other Ambulatory Visit: Payer: Self-pay | Admitting: Family Medicine

## 2018-11-14 DIAGNOSIS — E2839 Other primary ovarian failure: Secondary | ICD-10-CM

## 2018-11-17 ENCOUNTER — Other Ambulatory Visit: Payer: Self-pay

## 2018-11-17 ENCOUNTER — Ambulatory Visit
Admission: RE | Admit: 2018-11-17 | Discharge: 2018-11-17 | Disposition: A | Discharge status: Home / Self Care | Payer: 59 | Source: Ambulatory Visit | Attending: Family Medicine | Admitting: Family Medicine

## 2018-11-17 DIAGNOSIS — E2839 Other primary ovarian failure: Secondary | ICD-10-CM

## 2018-11-20 ENCOUNTER — Ambulatory Visit
Admission: RE | Admit: 2018-11-20 | Discharge: 2018-11-20 | Disposition: A | Discharge status: Home / Self Care | Payer: 59 | Source: Ambulatory Visit | Attending: Hematology | Admitting: Hematology

## 2018-11-20 ENCOUNTER — Other Ambulatory Visit: Payer: Self-pay

## 2018-11-20 DIAGNOSIS — Z803 Family history of malignant neoplasm of breast: Secondary | ICD-10-CM

## 2018-11-20 MED ORDER — GADOBUTROL 1 MMOL/ML IV SOLN
10.0000 mL | Freq: Once | INTRAVENOUS | Status: AC | PRN
  Administered 2018-11-20: 10 mL via INTRAVENOUS

## 2018-12-20 ENCOUNTER — Other Ambulatory Visit: Payer: Self-pay

## 2018-12-20 ENCOUNTER — Inpatient Hospital Stay: Payer: 59 | Attending: Hematology

## 2018-12-20 DIAGNOSIS — D509 Iron deficiency anemia, unspecified: Secondary | ICD-10-CM | POA: Diagnosis not present

## 2018-12-20 LAB — CBC WITH DIFFERENTIAL (CANCER CENTER ONLY)
Abs Immature Granulocytes: 0.03 10*3/uL (ref 0.00–0.07)
Basophils Absolute: 0.1 10*3/uL (ref 0.0–0.1)
Basophils Relative: 1 %
Eosinophils Absolute: 0.3 10*3/uL (ref 0.0–0.5)
Eosinophils Relative: 4 %
HCT: 41.4 % (ref 36.0–46.0)
Hemoglobin: 13.9 g/dL (ref 12.0–15.0)
Immature Granulocytes: 0 %
Lymphocytes Relative: 13 %
Lymphs Abs: 1.1 10*3/uL (ref 0.7–4.0)
MCH: 29.9 pg (ref 26.0–34.0)
MCHC: 33.6 g/dL (ref 30.0–36.0)
MCV: 89 fL (ref 80.0–100.0)
Monocytes Absolute: 0.8 10*3/uL (ref 0.1–1.0)
Monocytes Relative: 9 %
Neutro Abs: 6.2 10*3/uL (ref 1.7–7.7)
Neutrophils Relative %: 73 %
Platelet Count: 284 10*3/uL (ref 150–400)
RBC: 4.65 MIL/uL (ref 3.87–5.11)
RDW: 13.8 % (ref 11.5–15.5)
WBC Count: 8.5 10*3/uL (ref 4.0–10.5)
nRBC: 0 % (ref 0.0–0.2)

## 2018-12-20 LAB — CMP (CANCER CENTER ONLY)
ALT: 23 U/L (ref 0–44)
AST: 20 U/L (ref 15–41)
Albumin: 4.3 g/dL (ref 3.5–5.0)
Alkaline Phosphatase: 69 U/L (ref 38–126)
Anion gap: 9 (ref 5–15)
BUN: 11 mg/dL (ref 6–20)
CO2: 24 mmol/L (ref 22–32)
Calcium: 9.7 mg/dL (ref 8.9–10.3)
Chloride: 109 mmol/L (ref 98–111)
Creatinine: 0.78 mg/dL (ref 0.44–1.00)
GFR, Est AFR Am: >60 mL/min (ref >60)
GFR, Estimated: >60 mL/min (ref >60)
Glucose, Bld: 109 mg/dL — ABNORMAL HIGH (ref 70–99)
Potassium: 4.1 mmol/L (ref 3.5–5.1)
Sodium: 142 mmol/L (ref 135–145)
Total Bilirubin: 0.4 mg/dL (ref 0.3–1.2)
Total Protein: 7.7 g/dL (ref 6.5–8.1)

## 2018-12-20 LAB — IRON AND TIBC
Iron: 33 ug/dL — ABNORMAL LOW (ref 41–142)
Saturation Ratios: 7 % — ABNORMAL LOW (ref 21–57)
TIBC: 501 ug/dL — ABNORMAL HIGH (ref 236–444)
UIBC: 468 ug/dL — ABNORMAL HIGH (ref 120–384)

## 2018-12-20 LAB — FERRITIN: Ferritin: 27 ng/mL (ref 11–307)

## 2018-12-21 ENCOUNTER — Telehealth: Payer: Self-pay

## 2018-12-21 NOTE — Telephone Encounter (Signed)
Spoke with patient regarding lab results, per Dr. Burr Medico mildly low iron, no anemia, no need for iv iron at present unless she is very fatigues and wants iv iron now.  She states she thinks she will hold off for now.  Instructed her to call back if symptoms worsen and she desires to get iv iron infusion.  She verbalized an understanding and states that she will.

## 2018-12-21 NOTE — Telephone Encounter (Signed)
-----   Message from Truitt Merle, MD sent at 12/21/2018  3:16 PM EDT ----- Please let her know the lab result, mildly low iron, no anemia, no need iv iron unless she is very fatigued and wants iv iron. OK to schedule one infusion if she wishes.  Thanks   Truitt Merle 12/21/2018

## 2018-12-22 ENCOUNTER — Other Ambulatory Visit: Payer: Self-pay | Admitting: Physician Assistant

## 2018-12-22 DIAGNOSIS — I480 Paroxysmal atrial fibrillation: Secondary | ICD-10-CM

## 2018-12-22 NOTE — Telephone Encounter (Signed)
Age 53, weight 110kg, SCr 0.78 on 11/23/18, CrCl 11ml/min, last OV Jan 2020, afib indication

## 2019-01-01 ENCOUNTER — Ambulatory Visit
Admission: RE | Admit: 2019-01-01 | Discharge: 2019-01-01 | Disposition: A | Discharge status: Home / Self Care | Payer: 59 | Source: Ambulatory Visit | Attending: Pain Medicine | Admitting: Pain Medicine

## 2019-01-01 ENCOUNTER — Other Ambulatory Visit: Payer: Self-pay | Admitting: Pain Medicine

## 2019-01-01 ENCOUNTER — Other Ambulatory Visit: Payer: Self-pay

## 2019-01-01 DIAGNOSIS — G4486 Cervicogenic headache: Secondary | ICD-10-CM

## 2019-01-01 DIAGNOSIS — M542 Cervicalgia: Secondary | ICD-10-CM

## 2019-01-02 ENCOUNTER — Telehealth: Payer: Self-pay | Admitting: *Deleted

## 2019-01-02 NOTE — Telephone Encounter (Signed)
   [facility] Pre-operative Risk Assessment    Request for surgical clearance:  1. What type of surgery is being performed? LEFT MESSAGE TO CONFIRM TYPE OF PROCEDURE  2. When is this surgery scheduled? 01/22/19  3. What type of clearance is required (medical clearance vs. Pharmacy clearance to hold med vs. Both)? BOTH  4. Are there any medications that need to be held prior to surgery and how long? XARELTO X 5 DAYS PRIOR   5. Practice name and name of physician performing surgery? [facility]; DR. DAVID SPIVEY   6. What is your office phone number [phone]    7.   What is your office fax number [phone]  8.   Anesthesia type (None, local, MAC, general) ? NEED TO CONFIRM TYPE OF ANESTHESIA    Julaine Hua 01/02/2019, 4:10 PM  _________________________________________________________________   (provider comments below)

## 2019-01-03 NOTE — Telephone Encounter (Signed)
Follow up   Melissa [name] is returning call about clearance from yesterday. Please call.

## 2019-01-03 NOTE — Telephone Encounter (Signed)
Patient with diagnosis of afib on Xarelto for anticoagulation.    Procedure: CERVICAL FACET NERVE INJECTIONS Date of procedure: 01/22/2019  CHADS2-VASc score of  2 (CHF,  female)  CrCl 103.58  Per office protocol, patient can hold Xarelto for 3 days prior to procedure.

## 2019-01-03 NOTE — Telephone Encounter (Signed)
Follow up   Forwarding to [employer].

## 2019-01-03 NOTE — Telephone Encounter (Signed)
I tried to call back and s/w Roselyn Reef from the [facility]. Left message to call back with type procedure to be done as well as type of anesthesia (if any being used). Ok to leave information with the operator, who will then send me the information.

## 2019-01-03 NOTE — Telephone Encounter (Signed)
   Primary Cardiologist:Peter Johnsie Cancel, MD  Chart reviewed as part of pre-operative protocol coverage. Because of Melissa [name]'s past medical history and time since last visit, he/she will require a follow-up visit in order to better assess preoperative cardiovascular risk.  Pre-op covering staff: - Please schedule appointment and call patient to inform them. - Please contact requesting surgeon's office via preferred method (i.e, phone, fax) to inform them of need for appointment prior to surgery.  If applicable, this message will also be routed to pharmacy pool and/or primary cardiologist for input on holding anticoagulant/antiplatelet agent as requested below so that this information is available at time of patient's appointment.   [place], [place]  01/03/2019, 4:21 PM

## 2019-01-03 NOTE — Telephone Encounter (Signed)
Surgery scheduler sent further needed information for pt's surgery/procedure.   TYPE OF SURGERY: CERVICAL FACET NERVE INJECTIONS ANESTHESIA: LOCAL (LIDOCAINE)

## 2019-01-03 NOTE — Telephone Encounter (Signed)
Clinical pharmacist to review Xarelto 

## 2019-01-04 ENCOUNTER — Telehealth: Payer: Self-pay

## 2019-01-04 NOTE — Telephone Encounter (Signed)
Tried calling patient to get her scheduled for a pre op clearance appointment before her 01/22/19 scheduled procedure. Patient has a Research scientist (medical) that is full.

## 2019-01-08 NOTE — Telephone Encounter (Signed)
° ° °  Called patient to offer appointment, states she will call back to schedule

## 2019-01-12 NOTE — Telephone Encounter (Signed)
Our office received a 2nd clearance form for pt's procedure 11/2 with Dr. Dian Situ. I called and lmom we did receive the original clearance on 10/113/20, per cardiologist pt will need an appt before being cleared. Lmom that we did s/w the pt on 10/15 and offered an appt though pt stated she will call back and schedule. Lmom pt has not yet schedule appt.

## 2019-01-17 ENCOUNTER — Encounter: Payer: Self-pay | Admitting: Physical Medicine and Rehabilitation

## 2019-01-17 NOTE — Telephone Encounter (Signed)
Note recommendation that patient needs appointment prior to surgical clearance. Patient to call back to schedule. This note will be removed from the preop pool.  Richardson Dopp, PA-C    01/17/2019 9:03 AM

## 2019-02-01 ENCOUNTER — Ambulatory Visit (INDEPENDENT_AMBULATORY_CARE_PROVIDER_SITE_OTHER): Payer: 59 | Admitting: Physical Medicine and Rehabilitation

## 2019-02-01 ENCOUNTER — Telehealth: Payer: Self-pay | Admitting: Physical Medicine and Rehabilitation

## 2019-02-01 ENCOUNTER — Encounter: Payer: Self-pay | Admitting: Physical Medicine and Rehabilitation

## 2019-02-01 ENCOUNTER — Other Ambulatory Visit: Payer: Self-pay

## 2019-02-01 VITALS — BP 133/84 | HR 66

## 2019-02-01 DIAGNOSIS — G43519 Persistent migraine aura without cerebral infarction, intractable, without status migrainosus: Secondary | ICD-10-CM

## 2019-02-01 DIAGNOSIS — M797 Fibromyalgia: Secondary | ICD-10-CM | POA: Diagnosis not present

## 2019-02-01 DIAGNOSIS — R519 Headache, unspecified: Secondary | ICD-10-CM

## 2019-02-01 DIAGNOSIS — G4486 Cervicogenic headache: Secondary | ICD-10-CM

## 2019-02-01 DIAGNOSIS — G4440 Drug-induced headache, not elsewhere classified, not intractable: Secondary | ICD-10-CM | POA: Diagnosis not present

## 2019-02-01 NOTE — Progress Notes (Signed)
  Numeric Pain Rating Scale and Functional Assessment Average Pain 6 Pain Right Now 3 My pain is intermittent, stabbing and aching Pain is worse with: light, stress Pain improves with: nothing   In the last MONTH (on 0-10 scale) has pain interfered with the following?  1. General activity like being  able to carry out your everyday physical activities such as walking, climbing stairs, carrying groceries, or moving a chair?  Rating(9)  2. Relation with others like being able to carry out your usual social activities and roles such as  activities at home, at work and in your community. Rating(9)  3. Enjoyment of life such that you have  been bothered by emotional problems such as feeling anxious, depressed or irritable?  Rating(10)

## 2019-02-02 ENCOUNTER — Encounter: Payer: Self-pay | Admitting: Physical Medicine and Rehabilitation

## 2019-02-02 NOTE — Progress Notes (Signed)
Melissa [name] - 53 y.o. female MRN [id number]  Date of birth: 02-28-1966  Office Visit Note: Visit Date: 02/01/2019 PCP: Maurice Small, MD Referred by: Maurice Small, MD  Subjective: Chief Complaint  Patient presents with   Head - Pain   Neck - Pain   HPI: Melissa [name] is a 53 y.o. female who comes in today For evaluation and management of chronic worsening severe neck pain and headache.  Patient is well-known to me I am seeing her on a few occasions over the years through Dr. Joni Fears who she sees for her orthopedic care.  The last time I saw her was in June 2019 for more piriformis type injection and low back pain.  I do not think I have ever seen her for her neck pain specifically.  She has a history of chronic daily headache diagnosed at this point as migraine.  She has been seeing a neurologist at [facility] and those notes were reviewed and probably 50 pages of notes over the care everywhere part of epic.  She reports more recently over the last several months it has just become worse to the point where she has this almost daily she gets a lot of pain in a bandlike sensation but sometimes both eyes sometimes one eye or the other.  She reports a lot of stiffness in the neck that goes along with the headache.  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  She reports through the neurologist at [facility] have been tried Botox injections as well as other medications which just really are not helping.  She reports nothing from an abortive standpoint or prophylactic has really done much for her.  Her case is complicated by a known diagnosis of fibromyalgia.  Chronically she has been taking a small amount of hydrocodone through her primary care physician Dr. Justin Mend.  The [facility] neurologist was concerned for rebound type headaches with the pain medication.  The patient does report increasing the pain medication to a small degree and is really emotional today  because the pain is gotten to her to the point where she is contemplating disability and not working which is not something she really wants to do at all.  She was referred to Dr. Dian Situ who runs several pain management offices in [place] in [place].  She reports that she does not want to go back to see him.  She reports that he talk to her to some degree about her neck and did get cervical spine x-ray.  He talked about some type of procedure that she was not sure what he was talking about.  She states that when she asked him about the pain medication in terms of anything that would help her and she says she specifically was not necessarily asking about narcotic medications that he seemed to get irritated and in the front lobby in front of other patients suggested that prescribing her narcotics would be malpractice.  She has not had MRI of the cervical spine.  She has had MRI of the brain a year or so ago and that was looked at is normal there was some small incidental finding.  The reports reviewed below.  Cervical spine x-ray was reviewed that was performed through Dr. Vira Blanco.  She does have some general spondylitic change particularly at around C3-4 C4-5.  She has no listhesis she has some straightening of the lordotic curvature.  She does not  endorse any pain down the arms no paresthesias no radicular complaints.  Fibromyalgia usually is more across the upper back to lower back legs.  She has had multiple bouts of physical therapy without relief.  She rates her pain as a 6 out of 10.  She reports its intermittent stabbing aching.  Basically worse first thing in the morning and then worse later in the day.  She does live in [place].  Review of Systems  Constitutional: Negative for chills, fever, malaise/fatigue and weight loss.  HENT: Negative for hearing loss and sinus pain.   Eyes: Negative for blurred vision, double vision and photophobia.  Respiratory: Negative for cough and shortness of  breath.   Cardiovascular: Negative for chest pain, palpitations and leg swelling.  Gastrointestinal: Negative for abdominal pain, nausea and vomiting.  Genitourinary: Negative for flank pain.  Musculoskeletal: Positive for neck pain. Negative for myalgias.  Skin: Negative for itching and rash.  Neurological: Positive for headaches. Negative for tremors, focal weakness and weakness.  Endo/Heme/Allergies: Negative.   Psychiatric/Behavioral: Negative for depression.  All other systems reviewed and are negative.  Otherwise per HPI.  Assessment & Plan: Visit Diagnoses:  1. Intractable persistent migraine aura without cerebral infarction and without status migrainosus   2. Cervicogenic headache   3. Rebound headache   4. Fibromyalgia     Plan: Findings:  Chronic pain syndrome on chronic opioid treatment mainly for fibromyalgia through her primary care physician.  Reviewing the controlled substance database shows that she is only getting this from her primary care physician.  She has now had worsening over the last several months of headache and neck pain.  The neck pain is more upper cervical pain and occipital headache type pain but also some bandlike pain in obviously history of migraine seeing a neurologist at [facility].  I did discuss with her the rationale for not using hydrocodone for either chronic headache or fibromyalgia but she really at this point seems to rely on that and has been relying on the hydrocodone for quite some time to help her work.  At this point I think the right steps forward would be referral to a different headache specialist for another opinion and may be just a different look at things.  I will make the referral to Dr. Sarina Ill.  The patient has not had bilateral occipital blocks and I think there is other medications that we talked about she probably has not at least looked at.  She does have pain with extension and facet loading of the cervical spine.  I think  upper cervical facet joint blocks particularly C2-3 and may be C3-4 might be diagnostic and if they were to help greatly we could look at ablation procedure for that.  She does want to proceed with that.  Lastly she may want to seek referral to a pain management specialist that we deal more with medication treatment to find some combination and at least monitor her hydrocodone usage. Make sure her primary care physician Dr. Arbie Cookey with gets the note and I would recommend possible referral to the [facility] for pain and rehabilitation and either Drs. Kirsten or Posey Pronto or Tessa Lerner.  I have also had patients lately do well with referral to the [facility] that do offer some pain management through an internal medicine doctor.  Lastly there is a anesthesiologist off of [address] now Dr. Primus Bravo who used to practice in [place] and is mainly doing medication management at this point.  Finally [facility] pain management with Dr. Nicholaus Bloom.    Meds & Orders: No orders of the defined types were placed in this encounter.   Orders Placed This Encounter  Procedures   Ambulatory referral to Neurology    Follow-up: No follow-ups on file.   Procedures: No procedures performed  No notes on file   Clinical History: CLINICAL DATA: Chronic headache. Recent neck pain. Symptoms particularly left-sided.  EXAM: CERVICAL SPINE - 2-3 VIEW  COMPARISON: None.  FINDINGS: No malalignment. Mild straightening of the normal cervical lordosis. Mild disc space narrowing at C4-5 and C5-6. No advanced degenerative spondylosis. No sign of facet arthropathy. No evidence of craniocervical degenerative change.  IMPRESSION: Mild disc space narrowing at C4-5 and C5-6. No advanced finding that one would expect would be a definite cause of neck pain or cervicogenic pain syndromes.   Electronically Signed By: Nelson Chimes M.D. On: 01/01/2019 17:20 --------------  Interface, Rad Results In -  09/05/2015  8:23 AM EDT MRI BRAIN WITH AND WITHOUT CONTRAST (NEUROLOGY FLAIR PROTOCOL), 09/04/2015 6:37 PM   INDICATION:  severe headacheR51 Severe headache   COMPARISON: None.   TECHNIQUE: Multiplanar, multi-sequence MR imaging of the entire brain was performed before and after intravenous administration gadolinium-based contrast.   FINDINGS:  Calvarium/skull base: No focal marrow replacing lesion suggestive of neoplasm. Patchy region of intrinsic T1 hyperintensity within the diploic space of the left frontal calvarium may have partial enhancement on postcontrast imaging, however the intrinsic T1 hyperintensity favors a benign incidental etiology such as a hemangioma. Orbits: Grossly unremarkable. Paranasal sinuses: Imaged portions clear. Brain:     No substantial white matter disease for patient's age. There is a single focus of T2/FLAIR hyperintensity in the left medial occipital white matter (series 7, image 9) that may represent the sequela prior ischemia, inflammation or demyelination. No evidence of acute ischemia. No mass effect, hemorrhage, or hydrocephalus. No abnormal enhancement to suggest neoplasm, abscess, or mass lesion. Grossly normal flow-related signal in the major intracranial arteries and dural sinuses. Additional comments: None.   CONCLUSION:  Unremarkable pre and postcontrast MRI of the brain.   She reports that she quit smoking about 23 years ago. Her smoking use included cigarettes. She has a 2.50 pack-year smoking history. She has never used smokeless tobacco. No results for input(s): HGBA1C, LABURIC in the last 8760 hours.  Objective:  VS:  HT:     WT:    BMI:      BP:133/84   HR:66bpm   TEMP: ( )   RESP:  Physical Exam Vitals signs and nursing note reviewed.  Constitutional:      General: She is not in acute distress.    Appearance: Normal appearance. She is well-developed. She is obese.  HENT:     Head: Normocephalic and atraumatic.     Nose: Nose normal.      Mouth/Throat:     Mouth: Mucous membranes are moist.     Pharynx: Oropharynx is clear.  Eyes:     Conjunctiva/sclera: Conjunctivae normal.     Pupils: Pupils are equal, round, and reactive to light.  Neck:     Musculoskeletal: Normal range of motion and neck supple. Muscular tenderness present. No neck rigidity.  Cardiovascular:     Rate and Rhythm: Regular rhythm.  Pulmonary:     Effort: Pulmonary effort is normal. No respiratory distress.  Abdominal:     General: There is no distension.     Palpations: Abdomen is soft.     Tenderness:  There is no guarding.  Musculoskeletal:     Right lower leg: No edema.     Left lower leg: No edema.     Comments: Patient sits with forward flexed cervical spine to a small degree she has pretty stiff neck but can rotate fully left and right.  Pain with extension and forward flexion and possibly little bit more pain forward flexion.  Active trigger points in the levator scapula paraspinal musculature trapezius and rhomboids bilaterally.  No real shoulder impingement.  No real specific tender points noted.  Good strength in the upper extremities bilaterally with a negative Hoffmann's test bilaterally.  Negative Tinel's over the greater occipital nerves.  Skin:    General: Skin is warm and dry.     Findings: No erythema or rash.  Neurological:     General: No focal deficit present.     Mental Status: She is alert and oriented to person, place, and time.     Motor: No abnormal muscle tone.     Coordination: Coordination normal.     Gait: Gait normal.  Psychiatric:        Mood and Affect: Mood normal.        Behavior: Behavior normal.        Thought Content: Thought content normal.     Ortho Exam Imaging: No results found.  Past Medical/Family/Surgical/Social History: Medications & Allergies reviewed per EMR, new medications updated. Patient Active Problem List   Diagnosis Date Noted   Genetic testing 02/01/2018   Family history of  breast cancer    Family history of thyroid cancer    Family history of uterine cancer    Iron deficiency anemia 07/05/2017   Persistent atrial fibrillation ([place]) 02/22/2017   Chronic diastolic heart failure ([place]) 02/22/2017   Migraine    Depression    Arthritis    Allergy    ADD (attention deficit disorder)    Paroxysmal atrial fibrillation ([place]) 01/31/2017   Pulmonary hypertension, unspecified ([place]) 01/14/2017   History of atrial myxoma    Atrial mass [id number]   Acute diastolic heart failure ([place])    Migraines 12/24/2016   Left atrial mass 12/24/2016   (HFpEF) heart failure with preserved ejection fraction ([place]) 12/24/2016   Asthma without status asthmaticus 12/20/2016   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/2016   Weight gain 10/21/2016   BMI 38.0-38.9,adult 09/24/2016   Dyspnea on exertion 09/24/2016   Intractable persistent migraine aura without cerebral infarction and without status migrainosus 09/24/2016   Stress-related physiological response affecting medical condition 09/24/2016   Fibromyalgia 08/28/2015   Obesity (BMI 30-39.9) 08/28/2015   Status migrainosus 07/11/2015   Vitamin D deficiency 03/27/2015   Migraine without aura, intractable, without status migrainosus 03/13/2015   H/O gastric bypass 10/16/2013   Heartburn 10/16/2013   Osteoporosis 10/16/2013   Hair loss 08/28/2012   Disordered sleep 08/28/2012   S/P gastric bypass 06/21/2012   Insomnia 06/21/2012   Postoperative anemia 01/12/2012   MENOPAUSE-RELATED VASOMOTOR SYMPTOMS, HOT FLASHES 12/07/2007   RLS (restless legs syndrome) 10/26/2007   Allergic rhinitis 12/23/2006   HEADACHE 12/23/2006   ALLERGIC RHINITIS, SEASONAL 11/04/2006   SYMPTOM, DYSFUNCTION, SLEEP STAGE 11/04/2006   Past Medical History:  Diagnosis Date   (HFpEF) heart failure with preserved ejection fraction (HCC)    ADD (attention deficit disorder)    Allergy    Arthritis    Asthma    Atrial  myxoma 12/2016   Depression    Family history of breast cancer    Family history  of thyroid cancer    Family history of uterine cancer    Migraine    Osteoporosis    Pulmonary hypertension (HCC)    RLS (restless legs syndrome)    Family History  Problem Relation Age of Onset   Thyroid cancer Mother    Endometrial cancer Sister 66   Breast cancer Maternal Aunt        dx > 94   Breast cancer Maternal Grandmother        dx >50   Breast cancer Maternal Aunt        dx  >50   Depression Other        FMHx   Stroke Paternal Grandfather    Past Surgical History:  Procedure Laterality Date   ABDOMINAL HYSTERECTOMY     CARDIOVERSION N/A 01/10/2017   Procedure: CARDIOVERSION;  Surgeon: Dorothy Spark, MD;  Location: [facility] ENDOSCOPY;  Service: Cardiovascular;  Laterality: N/A;   CHOLECYSTECTOMY     EXCISION OF ATRIAL MYXOMA Left 12/28/2016   Procedure: EXCISION OF LEFT ATRIAL MYXOMA;  Surgeon: Ivin Poot, MD;  Location: [facility];  Service: Open Heart Surgery;  Laterality: Left;   FOOT SURGERY     GASTRIC BYPASS     INTRAOPERATIVE TRANSESOPHAGEAL ECHOCARDIOGRAM N/A 12/28/2016   Procedure: INTRAOPERATIVE TRANSESOPHAGEAL ECHOCARDIOGRAM;  Surgeon: Ivin Poot, MD;  Location: [facility];  Service: Open Heart Surgery;  Laterality: N/A;   TEE WITHOUT CARDIOVERSION N/A 01/10/2017   Procedure: TRANSESOPHAGEAL ECHOCARDIOGRAM (TEE);  Surgeon: Dorothy Spark, MD;  Location: [facility] ENDOSCOPY;  Service: Cardiovascular;  Laterality: N/A;   TEE WITHOUT CARDIOVERSION N/A 05/17/2017   Procedure: TRANSESOPHAGEAL ECHOCARDIOGRAM (TEE);  Surgeon: Josue Hector, MD;  Location: [facility] ENDOSCOPY;  Service: Cardiovascular;  Laterality: N/A;   TOTAL KNEE ARTHROPLASTY     TUBAL LIGATION     Social History   Occupational History   Not on file  Tobacco Use   Smoking status: Former Smoker    Packs/day: 0.25    Years: 10.00    Pack years: 2.50    Types: Cigarettes    Quit date:  1997    Years since quitting: 23.8   Smokeless tobacco: Never Used  Substance and Sexual Activity   Alcohol use: No    Comment: Less than once a week.   Drug use: No   Sexual activity: Not on file

## 2019-02-02 NOTE — Telephone Encounter (Signed)
Per [facility] website no pa is needed for [phone] if appt is in office.

## 2019-02-05 ENCOUNTER — Other Ambulatory Visit: Payer: Self-pay

## 2019-02-05 DIAGNOSIS — Z20822 Contact with and (suspected) exposure to covid-19: Secondary | ICD-10-CM

## 2019-02-07 LAB — NOVEL CORONAVIRUS, NAA: SARS-CoV-2, NAA: NOT DETECTED

## 2019-02-12 ENCOUNTER — Encounter: Payer: Self-pay | Admitting: Physical Medicine and Rehabilitation

## 2019-02-13 ENCOUNTER — Other Ambulatory Visit: Payer: Self-pay | Admitting: Physical Medicine and Rehabilitation

## 2019-02-13 DIAGNOSIS — F411 Generalized anxiety disorder: Secondary | ICD-10-CM

## 2019-02-13 MED ORDER — TRIAZOLAM 0.25 MG PO TABS: ORAL_TABLET | ORAL | 0 refills | Status: DC

## 2019-02-13 NOTE — Progress Notes (Signed)
Pre-procedure triazolam ordered for pre-operative anxiety.  

## 2019-02-19 ENCOUNTER — Ambulatory Visit (INDEPENDENT_AMBULATORY_CARE_PROVIDER_SITE_OTHER): Payer: 59 | Admitting: Physical Medicine and Rehabilitation

## 2019-02-19 ENCOUNTER — Ambulatory Visit: Payer: Self-pay

## 2019-02-19 ENCOUNTER — Other Ambulatory Visit: Payer: Self-pay

## 2019-02-19 ENCOUNTER — Encounter: Payer: Self-pay | Admitting: Physical Medicine and Rehabilitation

## 2019-02-19 VITALS — BP 135/88 | HR 59

## 2019-02-19 DIAGNOSIS — M47812 Spondylosis without myelopathy or radiculopathy, cervical region: Secondary | ICD-10-CM | POA: Diagnosis not present

## 2019-02-19 DIAGNOSIS — R519 Headache, unspecified: Secondary | ICD-10-CM | POA: Diagnosis not present

## 2019-02-19 DIAGNOSIS — G4486 Cervicogenic headache: Secondary | ICD-10-CM

## 2019-02-19 MED ORDER — METHYLPREDNISOLONE ACETATE 80 MG/ML IJ SUSP
40.0000 mg | Freq: Once | INTRAMUSCULAR | Status: AC
  Administered 2019-02-19: 14:00:00 40 mg

## 2019-02-19 MED ORDER — BUPIVACAINE HCL 0.25 % IJ SOLN
2.0000 mL | Freq: Once | INTRAMUSCULAR | Status: AC
  Administered 2019-02-19: 14:00:00 2 mL

## 2019-02-19 NOTE — Progress Notes (Signed)
 .  Numeric Pain Rating Scale and Functional Assessment Average Pain 6   In the last MONTH (on 0-10 scale) has pain interfered with the following?  1. General activity like being  able to carry out your everyday physical activities such as walking, climbing stairs, carrying groceries, or moving a chair?  Rating(7)   +Driver, +BT(xarelto, ok for inj), -Dye Allergies.  

## 2019-02-20 NOTE — Progress Notes (Signed)
Melissa [name] - 53 y.o. female MRN [id number]  Date of birth: [birth year]  Office Visit Note: Visit Date: 02/19/2019 PCP: Maurice Small, MD Referred by: Maurice Small, MD  Subjective: Chief Complaint  Patient presents with  . Head - Pain  . Neck - Pain   HPI: Melissa [name] is a 53 y.o. female who comes in today For planned bilateral C2-3 facet joint/medial branch block for chronic worsening severe occipital type headaches and upper neck pain.  Please see our prior evaluation and management note for further details and justification.  Patient was given pain diary for documentation.  ROS Otherwise per HPI.  Assessment & Plan: Visit Diagnoses:  1. Cervical spondylosis without myelopathy   2. Cervicogenic headache     Plan: No additional findings.   Meds & Orders:  Meds ordered this encounter  Medications  . bupivacaine (MARCAINE) 0.25 % (with pres) injection 2 mL  . methylPREDNISolone acetate (DEPO-MEDROL) injection 40 mg    Orders Placed This Encounter  Procedures  . Facet Injection  . XR C-ARM NO REPORT    Follow-up: Return for Review Pain Diary.   Procedures: No procedures performed  Diagnostic Cervical Facet Joint Nerve Block with Fluoroscopic Guidance  Patient: Melissa [name]      Date of Birth: 02-25-1966 MRN: [id number] PCP: Maurice Small, MD      Visit Date: 02/19/2019   Universal Protocol:    Date/Time: 12/01/206:15 AM  Consent Given By: the patient  Position: PRONE  Additional Comments: Vital signs were monitored before and after the procedure. Patient was prepped and draped in the usual sterile fashion. The correct patient, procedure, and site was verified.   Injection Procedure Details:  Procedure Site One Meds Administered:  Meds ordered this encounter  Medications  . bupivacaine (MARCAINE) 0.25 % (with pres) injection 2 mL  . methylPREDNISolone acetate (DEPO-MEDROL) injection 40 mg     Laterality: Bilateral  Location/Site:  C2-3   Needle size: 25 G  Needle type: Spinal  Needle Placement: Articular Pillar  Findings:  -Contrast Used: 0.5 mL iohexol 180 mg iodine/mL   -Comments: Excellent flow of contrast over the articular pillars.  Procedure Details: The fluoroscope beam was positioned to square off the endplates of the desired vertebral level to achieve a true AP position. The beam was then moved in a small "counter" oblique to the contralateral side with a small amount of caudal tilt to achieve a trajectory alignment with the desired nerves.  For each target described below the skin was anesthetized with 1 ml of 1% Lidocaine without epinephrine.   To block the facet joint nerve to C2, the needle was fluoroscopically positioned over the inferior lateral portion of the C2/3 facet joint nerve where the third occipital nerve (TON) lies.   After contact with periosteum and negative aspirate for blood and CSF, correct placement without intravascular or epidural spread was confirmed by Bi-planar images and  injecting 0.5 ml. of Omnipaque-240.  A spot radiograph was obtained of this image.  Next, a 0.5 ml. volume of 1% Lidocaine without Epinephrine was then injected.  Prior to the procedure, the patient was given a Pain Diary which was completed for baseline measurements.  After the procedure, the patient rated their pain every 30 minutes and will continue rating at this frequency for a total of 5 hours.  The patient has been asked to complete the Diary and return to [place] by mail, fax or hand delivered as soon as possible.  Additional Comments:  The patient tolerated the procedure well Dressing: Band-Aid    Post-procedure details: Patient was observed during the procedure. Post-procedure instructions were reviewed.  Patient left the clinic in stable condition.      Clinical History: CLINICAL DATA: Chronic headache. Recent neck pain. Symptoms particularly left-sided.  EXAM: CERVICAL SPINE - 2-3 VIEW   COMPARISON: None.  FINDINGS: No malalignment. Mild straightening of the normal cervical lordosis. Mild disc space narrowing at C4-5 and C5-6. No advanced degenerative spondylosis. No sign of facet arthropathy. No evidence of craniocervical degenerative change.  IMPRESSION: Mild disc space narrowing at C4-5 and C5-6. No advanced finding that one would expect would be a definite cause of neck pain or cervicogenic pain syndromes.   Electronically Signed By: Nelson Chimes M.D. On: 01/01/2019 17:20 --------------  Interface, Rad Results In - 09/05/2015  8:23 AM EDT MRI BRAIN WITH AND WITHOUT CONTRAST (NEUROLOGY FLAIR PROTOCOL), 09/04/2015 6:37 PM   INDICATION:  severe headacheR51 Severe headache   COMPARISON: None.   TECHNIQUE: Multiplanar, multi-sequence MR imaging of the entire brain was performed before and after intravenous administration gadolinium-based contrast.   FINDINGS:  Calvarium/skull base: No focal marrow replacing lesion suggestive of neoplasm. Patchy region of intrinsic T1 hyperintensity within the diploic space of the left frontal calvarium may have partial enhancement on postcontrast imaging, however the intrinsic T1 hyperintensity favors a benign incidental etiology such as a hemangioma. Orbits: Grossly unremarkable. Paranasal sinuses: Imaged portions clear. Brain:     No substantial white matter disease for patient's age. There is a single focus of T2/FLAIR hyperintensity in the left medial occipital white matter (series 7, image 9) that may represent the sequela prior ischemia, inflammation or demyelination. No evidence of acute ischemia. No mass effect, hemorrhage, or hydrocephalus. No abnormal enhancement to suggest neoplasm, abscess, or mass lesion. Grossly normal flow-related signal in the major intracranial arteries and dural sinuses. Additional comments: None.   CONCLUSION:  Unremarkable pre and postcontrast MRI of the brain.   She reports that she  quit smoking about 23 years ago. Her smoking use included cigarettes. She has a 2.50 pack-year smoking history. She has never used smokeless tobacco. No results for input(s): HGBA1C, LABURIC in the last 8760 hours.  Objective:  VS:  HT:    WT:   BMI:     BP:135/88  HR:(!) 59bpm  TEMP: ( )  RESP:  Physical Exam  Ortho Exam Imaging: Xr C-arm No Report  Result Date: 02/19/2019 Please see Notes tab for imaging impression.   Past Medical/Family/Surgical/Social History: Medications & Allergies reviewed per EMR, new medications updated. Patient Active Problem List   Diagnosis Date Noted  . Genetic testing 02/01/2018  . Family history of breast cancer   . Family history of thyroid cancer   . Family history of uterine cancer   . Iron deficiency anemia 07/05/2017  . Persistent atrial fibrillation ([place]) 02/22/2017  . Chronic diastolic heart failure ([place]) 02/22/2017  . Migraine   . Depression   . Arthritis   . Allergy   . ADD (attention deficit disorder)   . Paroxysmal atrial fibrillation ([place]) 01/31/2017  . Pulmonary hypertension, unspecified ([place]) 01/14/2017  . History of atrial myxoma   . Atrial mass 12/28/2016  . Acute diastolic heart failure ([place])   . Migraines 12/24/2016  . Left atrial mass 12/24/2016  . (HFpEF) heart failure with preserved ejection fraction ([place]) 12/24/2016  . Asthma without status asthmaticus 12/20/2016  . 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/2016  . Weight gain 10/21/2016  .  BMI 38.0-38.9,adult 09/24/2016  . Dyspnea on exertion 09/24/2016  . Intractable persistent migraine aura without cerebral infarction and without status migrainosus 09/24/2016  . Stress-related physiological response affecting medical condition 09/24/2016  . Fibromyalgia 08/28/2015  . Obesity (BMI 30-39.9) 08/28/2015  . Status migrainosus 07/11/2015  . Vitamin D deficiency 03/27/2015  . Migraine without aura, intractable, without status migrainosus 03/13/2015  . H/O gastric bypass 10/16/2013  .  Heartburn 10/16/2013  . Osteoporosis 10/16/2013  . Hair loss 08/28/2012  . Disordered sleep 08/28/2012  . S/P gastric bypass 06/21/2012  . Insomnia 06/21/2012  . Postoperative anemia 01/12/2012  . MENOPAUSE-RELATED VASOMOTOR SYMPTOMS, HOT FLASHES 12/07/2007  . RLS (restless legs syndrome) 10/26/2007  . Allergic rhinitis 12/23/2006  . HEADACHE 12/23/2006  . ALLERGIC RHINITIS, SEASONAL 11/04/2006  . SYMPTOM, DYSFUNCTION, SLEEP STAGE 11/04/2006   Past Medical History:  Diagnosis Date  . (HFpEF) heart failure with preserved ejection fraction ([place])   . ADD (attention deficit disorder)   . Allergy   . Arthritis   . Asthma   . 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 Depression   . Family history of breast cancer   . Family history of thyroid cancer   . Family history of uterine cancer   . Migraine   . Osteoporosis   . Pulmonary hypertension ([place])   . RLS (restless legs syndrome)    Family History  Problem Relation Age of Onset  . Thyroid cancer Mother   . Endometrial cancer Sister 77  . Breast cancer Maternal Aunt        dx > 50  . Breast cancer Maternal Grandmother        dx >50  . Breast cancer Maternal Aunt        dx  >50  . Depression Other        FMHx  . Stroke Paternal Grandfather    Past Surgical History:  Procedure Laterality Date  . ABDOMINAL HYSTERECTOMY    . CARDIOVERSION N/A 01/10/2017   Procedure: CARDIOVERSION;  Surgeon: Dorothy Spark, MD;  Location[facility] ENDOSCOPY;  Service: Cardiovascular;  Laterality: N/A;  . CHOLECYSTECTOMY    . EXCISION OF ATRIAL MYXOMA Left 12/28/2016   Procedure: EXCISION OF LEFT ATRIAL MYXOMA;  Surgeon: Ivin Poot, MD;  Location: [facility];  Service: Open Heart Surgery;  Laterality: Left;  . FOOT SURGERY    . GASTRIC BYPASS    . INTRAOPERATIVE TRANSESOPHAGEAL ECHOCARDIOGRAM N/A 12/28/2016   Procedure: INTRAOPERATIVE TRANSESOPHAGEAL ECHOCARDIOGRAM;  Surgeon: Ivin Poot, MD;  Location: [facility];  Service: Open Heart Surgery;  Laterality:  N/A;  . TEE WITHOUT CARDIOVERSION N/A 01/10/2017   Procedure: TRANSESOPHAGEAL ECHOCARDIOGRAM (TEE);  Surgeon: Dorothy Spark, MD;  Location: [facility] ENDOSCOPY;  Service: Cardiovascular;  Laterality: N/A;  . TEE WITHOUT CARDIOVERSION N/A 05/17/2017   Procedure: TRANSESOPHAGEAL ECHOCARDIOGRAM (TEE);  Surgeon: Josue Hector, MD;  Location: [facility] ENDOSCOPY;  Service: Cardiovascular;  Laterality: N/A;  . TOTAL KNEE ARTHROPLASTY    . TUBAL LIGATION     Social History   Occupational History  . Not on file  Tobacco Use  . Smoking status: Former Smoker    Packs/day: 0.25    Years: 10.00    Pack years: 2.50    Types: Cigarettes    Quit date: 1997    Years since quitting: 23.9  . Smokeless tobacco: Never Used  Substance and Sexual Activity  . Alcohol use: No    Comment: Less than once a week.  . Drug use: No  .  Sexual activity: Not on file

## 2019-02-20 NOTE — Procedures (Signed)
Diagnostic Cervical Facet Joint Nerve Block with Fluoroscopic Guidance  Patient: Melissa [name]      Date of Birth: 1965-10-01 MRN: [id number] PCP: Maurice Small, MD      Visit Date: 02/19/2019   Universal Protocol:    Date/Time: 12/01/206:15 AM  Consent Given By: the patient  Position: PRONE  Additional Comments: Vital signs were monitored before and after the procedure. Patient was prepped and draped in the usual sterile fashion. The correct patient, procedure, and site was verified.   Injection Procedure Details:  Procedure Site One Meds Administered:  Meds ordered this encounter  Medications  . bupivacaine (MARCAINE) 0.25 % (with pres) injection 2 mL  . methylPREDNISolone acetate (DEPO-MEDROL) injection 40 mg     Laterality: Bilateral  Location/Site:  C2-3  Needle size: 25 G  Needle type: Spinal  Needle Placement: Articular Pillar  Findings:  -Contrast Used: 0.5 mL iohexol 180 mg iodine/mL   -Comments: Excellent flow of contrast over the articular pillars.  Procedure Details: The fluoroscope beam was positioned to square off the endplates of the desired vertebral level to achieve a true AP position. The beam was then moved in a small "counter" oblique to the contralateral side with a small amount of caudal tilt to achieve a trajectory alignment with the desired nerves.  For each target described below the skin was anesthetized with 1 ml of 1% Lidocaine without epinephrine.   To block the facet joint nerve to C2, the needle was fluoroscopically positioned over the inferior lateral portion of the C2/3 facet joint nerve where the third occipital nerve (TON) lies.   After contact with periosteum and negative aspirate for blood and CSF, correct placement without intravascular or epidural spread was confirmed by Bi-planar images and  injecting 0.5 ml. of Omnipaque-240.  A spot radiograph was obtained of this image.  Next, a 0.5 ml. volume of 1% Lidocaine without  Epinephrine was then injected.  Prior to the procedure, the patient was given a Pain Diary which was completed for baseline measurements.  After the procedure, the patient rated their pain every 30 minutes and will continue rating at this frequency for a total of 5 hours.  The patient has been asked to complete the Diary and return to [place] by mail, fax or hand delivered as soon as possible.   Additional Comments:  The patient tolerated the procedure well Dressing: Band-Aid    Post-procedure details: Patient was observed during the procedure. Post-procedure instructions were reviewed.  Patient left the clinic in stable condition.

## 2019-03-06 ENCOUNTER — Telehealth: Payer: Self-pay | Admitting: Hematology

## 2019-03-06 NOTE — Telephone Encounter (Signed)
YF Call day 1/27 lab/fu to AM. Left message. Schedule mailed.

## 2019-03-26 ENCOUNTER — Encounter: Payer: Self-pay | Admitting: Physical Medicine and Rehabilitation

## 2019-03-29 ENCOUNTER — Other Ambulatory Visit: Payer: Self-pay

## 2019-03-29 ENCOUNTER — Ambulatory Visit (INDEPENDENT_AMBULATORY_CARE_PROVIDER_SITE_OTHER): Payer: 59 | Admitting: Physical Medicine and Rehabilitation

## 2019-03-29 ENCOUNTER — Ambulatory Visit: Payer: Self-pay

## 2019-03-29 VITALS — BP 112/70 | HR 85

## 2019-03-29 DIAGNOSIS — M47812 Spondylosis without myelopathy or radiculopathy, cervical region: Secondary | ICD-10-CM | POA: Diagnosis not present

## 2019-03-29 DIAGNOSIS — R519 Headache, unspecified: Secondary | ICD-10-CM

## 2019-03-29 DIAGNOSIS — G4486 Cervicogenic headache: Secondary | ICD-10-CM

## 2019-03-29 MED ORDER — METHYLPREDNISOLONE ACETATE 80 MG/ML IJ SUSP
40.0000 mg | Freq: Once | INTRAMUSCULAR | Status: AC
  Administered 2019-03-29: 09:00:00 40 mg

## 2019-03-29 NOTE — Progress Notes (Signed)
  Numeric Pain Rating Scale and Functional Assessment Average Pain 6   In the last MONTH (on 0-10 scale) has pain interfered with the following?  1. General activity like being  able to carry out your everyday physical activities such as walking, climbing stairs, carrying groceries, or moving a chair?  Rating(8)   +Driver,  -Dye Allergies.

## 2019-04-02 NOTE — Progress Notes (Addendum)
[id number]    Provider:  Dr Jaynee Eagles Requesting Provider: Maurice Small, MD Primary Care Provider:  Maurice Small, MD  CC:  migraines  HPI:  Melissa [name] is a 54 y.o. female here as requested by Maurice Small, MD for migraines. She has a PMHx of RLS, Pulm HTN, osteoporosis, migraine, depression, arthritis, asthma.  Patient has been going to [facility] for her migraine management and I reviewed those notes.  It appears as though she has been getting Botox injections.  Per notes, patient has 30 headache days a month and 100 migraine hours a month, she has photophobia, phonophobia, rare nausea, throbbing pounding pain, there is no medication overuse and that has been addressed, she has failed a plethora of medications including "Namenda, Emgality, tizanidine 2 mg as needed, Topamax, Depakote, Zyprexa, Medrol Dosepak, sumatriptan hand, promethazine 12.5 mg as needed, Imitrex injection (extremely painful but only thing that helps), Zomig, migraine all NS, Imitrex p.o. and nasal, Amerge, ondansetron, Aimovig, Topamax, imipramine, BuSpar, Lexapro, verapamil, Zoloft, Cymbalta, gabapentin, methergine, propranolol and lasmiditan.  At her last appointment in July 2020, she was referred for physical therapy for neck pain and chronic migraine, Botox was requested and it appears as though she has had at least 1 injection, they started tizanidine 2 to 4 mg every evening for the neck pain, Topamax 50 mg twice per day.  They stop Namenda.  She continues on [employer].  They tried her on Axert.  Advised her to take it with promethazine and tizanidine for acute management.  She has also had a sleep study July 20 17th and an MRI of the brain June 2017.  I reviewed MRI report which showed unremarkable pre and postcontrast MRI of the brain.  The report for the sleep study was not available.  She is here alone. She did botox for several years but unsure if it worked. 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 Ernestina Patches has helped, he did occipital nerve blocks and is has really helped. Daily headaches, pulsating, pounding, throbbing, nausea, she goes to bed with a headache and has trouble sleeping, worse laying down when she goes to sleep and it keeps her up. She does not get an aura (she used to and is aware of stroke correlation), her whole vision gets blurry and this is not new ongoing, she had an MRI in the past. She had a sleep test and it was negative for sleep apnea, she has terrible insomnia. Started having migraines at 70. They can last 4-72 hours. Over the last year daily migraines. Stress affects for sure. Nothing seems to help. No other focal neurologic deficits, associated symptoms, inciting events or modifiable factors.  Reviewed notes, labs and imaging from outside physicians, which showed:  CBC, CMP normal  Review of Systems: Patient complains of symptoms per HPI as well as the following symptoms: fibromyalgia. Pertinent negatives and positives per HPI. All others negative.  Social History   Socioeconomic History  . Marital status: Married    Spouse name: Not on file  . Number of children: Not on file  . Years of education: Not on file  . Highest education level: Not on file  Occupational History  . Not on file  Tobacco Use  . Smoking status: Former Smoker    Packs/day: 0.25    Years: 10.00    Pack years: 2.50    Types: Cigarettes    Quit date: 1997     Years since quitting: 24.1  . Smokeless tobacco: Never Used  Substance and Sexual Activity  . Alcohol use: Yes    Alcohol/week: 3.0 - 5.0 standard drinks    Types: 3 - 5 Glasses of wine per week  . Drug use: No  . Sexual activity: Not on file  Other Topics Concern  . Not on file  Social History Narrative   Married.   Lives at home with spouse   Left handed   Caffeine: 1 cup/day   Social Determinants of Health   Financial Resource Strain:   . Difficulty of Paying Living Expenses: Not on file  Food Insecurity:   . Worried About Charity fundraiser in the Last Year: Not on file  . Ran Out of Food in the Last Year: Not on file  Transportation Needs:   . Lack of Transportation (Medical): Not on file  . Lack of Transportation (Non-Medical): Not on file  Physical Activity:   . Days of Exercise per Week: Not on file  . Minutes of Exercise per Session: Not on file  Stress:   . Feeling of Stress : Not on file  Social Connections:   . Frequency of Communication with Friends and Family: Not on file  . Frequency of Social Gatherings with Friends and Family: Not on file  . Attends Religious Services: Not on file  . Active Member of Clubs or Organizations: Not on file  . Attends Archivist Meetings: Not on file  . Marital Status: Not on file  Intimate Partner Violence:   . Fear of Current or Ex-Partner: Not on file  . Emotionally Abused: Not on file  . Physically Abused: Not on file  . Sexually Abused: Not on file    Family History  Problem Relation Age of Onset  . Thyroid cancer Mother   . Endometrial cancer Sister 28  . Breast cancer Maternal Aunt        dx > 50  . Breast cancer Maternal Grandmother        dx >50  . Breast cancer Maternal Aunt        dx  >50  . Depression Other        FMHx  . Stroke Paternal Grandfather   . Migraines Neg Hx     Past Medical History:  Diagnosis Date  . (HFpEF) heart failure with preserved ejection fraction ([place])   . ADD  (attention deficit disorder)   . Allergy   . Arthritis   . Asthma   . 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 Depression   . Family history of breast cancer   . Family history of thyroid cancer   . Family history of uterine cancer   . Migraine   . Osteoporosis   . Pulmonary hypertension ([place])   . RLS (restless legs syndrome)     Patient Active Problem List   Diagnosis Date Noted  . Chronic intractable headache 04/03/2019  . Genetic testing 02/01/2018  .  Family history of breast cancer   . Family history of thyroid cancer   . Family history of uterine cancer   . Gastroesophageal reflux disease with esophagitis 09/07/2017  . Iron deficiency anemia 07/05/2017  . Persistent atrial fibrillation ([place]) 02/22/2017  . Chronic diastolic heart failure ([place]) 02/22/2017  . Migraine   . Depression   . Arthritis   . Allergy   . ADD (attention deficit disorder)   . Paroxysmal atrial fibrillation ([place]) 01/31/2017  . Pulmonary hypertension, unspecified ([place]) 01/14/2017  . History of atrial myxoma   . Atrial mass 12/28/2016  . Acute diastolic heart failure ([place])   . Migraines 12/24/2016  . Left atrial mass 12/24/2016  . (HFpEF) heart failure with preserved ejection fraction ([place]) 12/24/2016  . Asthma without status asthmaticus 12/20/2016  . 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/2016  . Weight gain 10/21/2016  . BMI 38.0-38.9,adult 09/24/2016  . Dyspnea on exertion 09/24/2016  . Intractable persistent migraine aura without cerebral infarction and without status migrainosus 09/24/2016  . Stress-related physiological response affecting medical condition 09/24/2016  . Fibromyalgia 08/28/2015  . Obesity (BMI 30-39.9) 08/28/2015  . Status migrainosus 07/11/2015  . Vitamin D deficiency 03/27/2015  . Migraine without aura, intractable, without status migrainosus 03/13/2015  . H/O gastric bypass 10/16/2013  . Heartburn 10/16/2013  . Osteoporosis 10/16/2013  . Hair loss 08/28/2012  . Disordered sleep 08/28/2012  . S/P  gastric bypass 06/21/2012  . Insomnia 06/21/2012  . Postoperative anemia 01/12/2012  . MENOPAUSE-RELATED VASOMOTOR SYMPTOMS, HOT FLASHES 12/07/2007  . RLS (restless legs syndrome) 10/26/2007  . Allergic rhinitis 12/23/2006  . HEADACHE 12/23/2006  . ALLERGIC RHINITIS, SEASONAL 11/04/2006  . SYMPTOM, DYSFUNCTION, SLEEP STAGE 11/04/2006    Past Surgical History:  Procedure Laterality Date  . ABDOMINAL HYSTERECTOMY    . CARDIOVERSION N/A 01/10/2017   Procedure: CARDIOVERSION;  Surgeon: Dorothy Spark, MD;  [facility] END[facility]COPY;  Service: Cardiovascular;  Laterality: N/A;  . CHOLECYSTECTOMY    . EXCISION OF ATRIAL MYXOMA Left 12/28/2016   Procedure: EXCISION OF LEFT ATRIAL MYXOMA;  Surgeon: Ivin Poot, MD;  Location: [place];  Service: Open Heart Surgery;  Laterality: Left;  . FOOT SURGERY    . GASTRIC BYPASS    . INTRAOPERATIVE TRANSESOPHAGEAL ECHOCARDIOGRAM N/A 12/28/2016   Procedure: INTRAOPERATIVE TRANSESOPHAGEAL ECHOCARDIOGRAM;  Surgeon: Ivin Poot, MD;  Location: [place];  Service: Open Heart Surgery;  Laterality: N/A;  . TEE WITHOUT CARDIOVERSION N/A 01/10/2017   Procedure: TRANSESOPHAGEAL ECHOCARDIOGRAM (TEE);  Surgeon: Dorothy Spark, MD;  Location: [facility] ENDOSCOPY;  Service: Cardiovascular;  Laterality: N/A;  . TEE WITHOUT CARDIOVERSION N/A 05/17/2017   Procedure: TRANSESOPHAGEAL ECHOCARDIOGRAM (TEE);  Surgeon: Josue Hector, MD;  Location: [facility] ENDOSCOPY;  Service: Cardiovascular;  Laterality: N/A;  . TOTAL KNEE ARTHROPLASTY    . TUBAL LIGATION      Current Outpatient Medications  Medication Sig Dispense Refill  . almotriptan (AXERT) 12.5 MG tablet SMARTSIG:1 Tablet(s) By Mouth 1 to 2 Times Daily    . Cholecalciferol (VITAMIN D3) 50000 units CAPS Take 1 capsule by mouth once a week.    . flecainide (TAMBOCOR) 50 MG tablet Take 1 tablet by mouth twice daily. 180 tablet 1  . furosemide (LASIX) 40 MG tablet As needed for weight gain 30 tablet 3  .  HYDROcodone-acetaminophen (NORCO) 10-325 MG tablet Take 1 tablet by mouth 2 (two) times daily as needed.    . metoprolol tartrate (LOPRESSOR) 25 MG tablet Take 1 and 1/2 tablets by mouth twice daily. [place]  tablet 1  . Multiple Vitamin (MULTIVITAMIN) tablet Take 1 tablet by mouth 2 (two) times daily.     . potassium chloride SA (K-DUR,KLOR-CON) 20 MEQ tablet As needed for weight gain with Lasix 30 tablet 3  . pramipexole (MIRAPEX) 1 MG tablet Take 1 mg by mouth 2 (two) times daily.     . promethazine (PHENERGAN) 12.5 MG tablet Take 1-2 tablets by mouth 3 (three) times daily as needed for nausea/vomiting.  5  . tiZANidine (ZANAFLEX) 2 MG tablet Take 2 mg by mouth every 6 (six) hours as needed (headache).   5  . XARELTO 20 MG TABS tablet Take 1 tablet by mouth daily. 90 tablet 1  . acetaZOLAMIDE (DIAMOX) 250 MG tablet Take 2 tablets (500 mg total) by mouth 2 (two) times daily. 120 tablet 6  . amitriptyline (ELAVIL) 25 MG tablet Take 1-2 tablets (25-50 mg total) by mouth at bedtime. 60 tablet 3  . diltiazem (CARDIZEM) 30 MG tablet Take 1 tablet (30 mg total) every 6 (six) hours as needed by mouth. For fast heart rate. Continue long acting diltiazem, total of no more than 360 mg per day 30 tablet 0  . methylPREDNISolone (MEDROL DOSEPAK) 4 MG TBPK tablet follow package directions 21 tablet 1  . rizatriptan (MAXALT-MLT) 10 MG disintegrating tablet Take 1 tablet (10 mg total) by mouth as needed for migraine. May repeat in 2 hours if needed 9 tablet 11   No current facility-administered medications for this visit.    Allergies as of 04/03/2019 - Review Complete 04/03/2019  Allergen Reaction Noted  . Other Anaphylaxis and Other (See Comments) 01/04/2013    Vitals: BP 110/74 (BP Location: Left Arm, Patient Position: Sitting, Cuff Size: Large)   Pulse 71   Ht 5\' 6"  (1.676 m)   Wt 220 lb (99.8 kg)   BMI 35.51 kg/m  Last Weight:  Wt Readings from Last 1 Encounters:  05/04/19 222 lb 1.9 oz (100.8 kg)     Last Height:   Ht Readings from Last 1 Encounters:  05/04/19 5\' 6"  (1.676 m)     Physical exam: Exam: Gen: NAD, conversant, well nourised, obese, well groomed                     CV: RRR, no MRG. No Carotid Bruits. No peripheral edema, warm, nontender Eyes: Conjunctivae clear without exudates or hemorrhage  Neuro: Detailed Neurologic Exam  Speech:    Speech is normal; fluent and spontaneous with normal comprehension.  Cognition:    The patient is oriented to person, place, and time;     recent and remote memory intact;     language fluent;     normal attention, concentration,     fund of knowledge Cranial Nerves:    The pupils are equal, round, and reactive to light. The fundi are normal and spontaneous venous pulsations are present. Visual fields are full to finger confrontation. Extraocular movements are intact. Trigeminal sensation is intact and the muscles of mastication are normal. The face is symmetric. The palate elevates in the midline. Hearing intact. Voice is normal. Shoulder shrug is normal. The tongue has normal motion without fasciculations.   Coordination:    Normal finger to nose and heel to shin. Normal rapid alternating movements.   Gait:    Heel-toe and tandem gait are normal.   Motor Observation:    No asymmetry, no atrophy, and no involuntary movements noted. Tone:    Normal muscle tone.  Posture:    Posture is normal. normal erect    Strength:    Strength is V/V in the upper and lower limbs.      Sensation: intact to LT     Reflex Exam:  DTR's:    Deep tendon reflexes in the upper and lower extremities are normal bilaterally.   Toes:    The toes are downgoing bilaterally.   Clonus:    Clonus is absent.    Assessment/Plan:  Chronic migraines, failed multiple avenues and medications and she has already been to [facility] academic center and multiple neurologists. At this time she has tried and failed more medications then I have even  documented, she needs an academic center who can provide treatments we cannot such as vyepti or [place] or IV Ketamine or extended DHE protocol or even occipital stimulators or have her participate in a clinical trial. She continues to have 30/30 migraine days. LP opening pressure and csf were normal. She has had brain imaging and sleep studies. Referral to [facility]. Discussed biofeedback, dry needling. At this time (05/22/2019 addendum) increase acetazolamide to 500mg  bid and increase Elavil to 50mg  at bedtime.   Preventative meds tried that can be used in migraine prevention: Namenda, Emgality, Topamax, Acetazolamide, metoprolol, depakote, aimovig, imipramine, amitriptyline, buspar,  BuSpar, Lexapro, verapamil, Zoloft, Cymbalta, gabapentin, propranolol, lasmiditan, botox for several years, cardizem   Acute: Nurtec(helped but insurance would not approve even with copay card), tizanidine, zyprexa, medrol dosepak, sumatriptan PO and injections and nasal, promethazine 12.5 mg as needed, Imitrex injection (extremely painful but only thing that helps), Zomig, Amerge "and all triptans", ondansetron, methergine, propranolol and lasmiditan, fioricet, reglan injection, compazine injection, rizatriptan, valproate injection   - Continue with Laurence Spates, MD   Orders Placed This Encounter  Procedures  . DG FLUORO GUIDED LOC OF NEEDLE/CATH TIP FOR SPINAL INJECT LT   Meds ordered this encounter  Medications  . amitriptyline (ELAVIL) 25 MG tablet    Sig: Take 1-2 tablets (25-50 mg total) by mouth at bedtime.    Dispense:  60 tablet    Refill:  3  . DISCONTD: Rimegepant Sulfate (NURTEC) 75 MG TBDP    Sig: Take 75 mg by mouth daily as needed. For migraines. Take as close to onset of migraine as possible. One daily maximum.    Dispense:  10 tablet    Refill:  6    Patient has copay card; she can have medication for $5 regardless of insurance approval or copay amount.    Discussed:  "There is  increased risk for stroke in women with migraine with aura and a contraindication for the combined contraceptive pill for use by women who have migraine with aura. The risk for women with migraine without aura is lower. However other risk factors like smoking are far more likely to increase stroke risk than migraine. There is a recommendation for no smoking and for the use of OCPs without estrogen such as progestogen only pills particularly for women with migraine with aura.Marland Kitchen People who have migraine headaches with auras may be 3 times more likely to have a stroke caused by a blood clot, compared to migraine patients who don't see auras. Women who take hormone-replacement therapy may be 30 percent more likely to suffer a clot-based stroke than women not taking medication containing estrogen. Other risk factors like smoking and high blood pressure may be  much more important."   Discussed: To prevent or relieve headaches, try the following: Cool Compress.  Lie down and place a cool compress on your head.  Avoid headache triggers. If certain foods or odors seem to have triggered your migraines in the past, avoid them. A headache diary might help you identify triggers.  Include physical activity in your daily routine. Try a daily walk or other moderate aerobic exercise.  Manage stress. Find healthy ways to cope with the stressors, such as delegating tasks on your to-do list.  Practice relaxation techniques. Try deep breathing, yoga, massage and visualization.  Eat regularly. Eating regularly scheduled meals and maintaining a healthy diet might help prevent headaches. Also, drink plenty of fluids.  Follow a regular sleep schedule. Sleep deprivation might contribute to headaches Consider biofeedback. With this mind-body technique, you learn to control certain bodily functions -- such as muscle tension, heart rate and blood pressure -- to prevent headaches or reduce headache pain.   Patient has copay card; she  can have medication for $5 regardless of insurance approval or copay amount.    Proceed to emergency room if you experience new or worsening symptoms or symptoms do not resolve, if you have new neurologic symptoms or if headache is severe, or for any concerning symptom.   Provided education and documentation from American headache Society toolbox including articles on: chronic migraine medication overuse headache, chronic migraines, prevention of migraines, behavioral and other nonpharmacologic treatments for headache.   Cc: Maurice Small, MD, Laurence Spates MD  Sarina Ill, MD  [facility] [address] [place], [place] [address]  Phone [phone] Fax [phone]  A total of 80 minutes was spent face-to-face with this patient. Over half this time was spent on counseling patient on the  1. Chronic intractable headache, unspecified headache type    diagnosis and different diagnostic and therapeutic options, counseling and coordination of care, risks ans benefits of management, compliance, or risk factor reduction and education.

## 2019-04-03 ENCOUNTER — Ambulatory Visit: Payer: 59 | Admitting: Neurology

## 2019-04-03 ENCOUNTER — Encounter: Payer: Self-pay | Admitting: Neurology

## 2019-04-03 ENCOUNTER — Other Ambulatory Visit: Payer: Self-pay

## 2019-04-03 VITALS — BP 110/74 | HR 71 | Ht 66.0 in | Wt 220.0 lb

## 2019-04-03 DIAGNOSIS — G8929 Other chronic pain: Secondary | ICD-10-CM | POA: Insufficient documentation

## 2019-04-03 DIAGNOSIS — R519 Headache, unspecified: Secondary | ICD-10-CM

## 2019-04-03 MED ORDER — AMITRIPTYLINE HCL 25 MG PO TABS: 25.0000 mg | ORAL_TABLET | Freq: Every day | ORAL | 3 refills | Status: DC

## 2019-04-03 MED ORDER — NURTEC 75 MG PO TBDP: 75.0000 mg | ORAL_TABLET | Freq: Every day | ORAL | 6 refills | Status: DC | PRN

## 2019-04-03 NOTE — Patient Instructions (Addendum)
Lumbar puncture Start Amitriptyline at bedtime Vyepti  Discussed:  "There is increased risk for stroke in women with migraine with aura and a contraindication for the combined contraceptive pill for use by women who have migraine with aura. The risk for women with migraine without aura is lower. However other risk factors like smoking are far more likely to increase stroke risk than migraine. There is a recommendation for no smoking and for the use of OCPs without estrogen such as progestogen only pills particularly for women with migraine with aura.Marland Kitchen People who have migraine headaches with auras may be 3 times more likely to have a stroke caused by a blood clot, compared to migraine patients who don't see auras. Women who take hormone-replacement therapy may be 30 percent more likely to suffer a clot-based stroke than women not taking medication containing estrogen. Other risk factors like smoking and high blood pressure may be  much more important."    Idiopathic Intracranial Hypertension  Idiopathic intracranial hypertension (IIH) is a condition that increases pressure around the brain. The fluid that surrounds the brain and spinal cord (cerebrospinal fluid, CSF) increases and causes the pressure. Idiopathic means that the cause of this condition is not known. IIH affects the brain and spinal cord (is a neurological disorder). If this condition is not treated, it can cause vision loss or blindness. What increases the risk? You are more likely to develop this condition if:  You are severely overweight (obese).  You are a woman who has not gone through menopause.  You take certain medicines, such as birth control or steroids. What are the signs or symptoms? Symptoms of IIH include:  Headaches. This is the most common symptom.  Pain in the shoulders or neck.  Nausea and vomiting.  A "rushing water" or pulsing sound within the ears (pulsatile tinnitus).  Double vision.  Blurred  vision.  Brief episodes of complete vision loss. How is this diagnosed? This condition may be diagnosed based on:  Your symptoms.  Your medical history.  CT scan of the brain.  MRI of the brain.  Magnetic resonance venogram (MRV) to check veins in the brain.  Diagnostic lumbar puncture. This is a procedure to remove and examine a sample of cerebrospinal fluid. This procedure can determine whether too much fluid may be causing IIH.  A thorough eye exam to check for swelling or nerve damage in the eyes. How is this treated? Treatment for this condition depends on your symptoms. The goal of treatment is to decrease the pressure around your brain. Common treatments include:  Medicines to decrease the production of spinal fluid and lower the pressure within your skull.  Medicines to prevent or treat headaches.  Surgery to place drains (shunts) in your brain to remove excess fluid.  Lumbar puncture to remove excess cerebrospinal fluid. Follow these instructions at home:  If you are overweight or obese, work with your health care provider to lose weight.  Take over-the-counter and prescription medicines only as told by your health care provider.  Do not drive or use heavy machinery while taking medicines that can make you sleepy.  Keep all follow-up visits as told by your health care provider. This is important. Contact a health care provider if:  You have changes in your vision, such as: ? Double vision. ? Not being able to see colors (color vision). Get help right away if:  You have any of the following symptoms and they get worse or do not get better. ? Headaches. ?  Nausea. ? Vomiting. ? Vision changes or difficulty seeing. Summary  Idiopathic intracranial hypertension (IIH) is a condition that increases pressure around the brain. The cause is not known (is idiopathic).  The most common symptom of IIH is headaches.  Treatment may include medicines or surgery to  relieve the pressure on your brain. This information is not intended to replace advice given to you by your health care provider. Make sure you discuss any questions you have with your health care provider. Document Revised: 02/18/2017 Document Reviewed: 01/28/2016 Elsevier Patient Education  [place]. Amitriptyline tablets What is this medicine? AMITRIPTYLINE (a mee TRIP ti leen) is used to treat depression. This medicine may be used for other purposes; ask your health care provider or pharmacist if you have questions. COMMON BRAND NAME(S): Elavil, Vanatrip What should I tell my health care provider before I take this medicine? They need to know if you have any of these conditions:  an alcohol problem  asthma, difficulty breathing  bipolar disorder or schizophrenia  difficulty passing urine, prostate trouble  glaucoma  heart disease or previous heart attack  liver disease  over active thyroid  seizures  thoughts or plans of suicide, a previous suicide attempt, or family history of suicide attempt  an unusual or allergic reaction to amitriptyline, other medicines, foods, dyes, or preservatives  pregnant or trying to get pregnant  breast-feeding How should I use this medicine? Take this medicine by mouth with a drink of water. Follow the directions on the prescription label. You can take the tablets with or without food. Take your medicine at regular intervals. Do not take it more often than directed. Do not stop taking this medicine suddenly except upon the advice of your doctor. Stopping this medicine too quickly may cause serious side effects or your condition may worsen. A special MedGuide will be given to you by the pharmacist with each prescription and refill. Be sure to read this information carefully each time. Talk to your pediatrician regarding the use of this medicine in children. Special care may be needed. Overdosage: If you think you have taken too much  of this medicine contact a poison control center or emergency room at once. NOTE: This medicine is only for you. Do not share this medicine with others. What if I miss a dose? If you miss a dose, take it as soon as you can. If it is almost time for your next dose, take only that dose. Do not take double or extra doses. What may interact with this medicine? Do not take this medicine with any of the following medications:  arsenic trioxide  certain medicines used to regulate abnormal heartbeat or to treat other heart conditions  cisapride  droperidol  halofantrine  linezolid  MAOIs like Carbex, Eldepryl, Marplan, Nardil, and Parnate  methylene blue  other medicines for mental depression  phenothiazines like perphenazine, thioridazine and chlorpromazine  pimozide  probucol  procarbazine  sparfloxacin  St. John's Wort This medicine may also interact with the following medications:  atropine and related drugs like hyoscyamine, scopolamine, tolterodine and others  barbiturate medicines for inducing sleep or treating seizures, like phenobarbital  cimetidine  disulfiram  ethchlorvynol  thyroid hormones such as levothyroxine  ziprasidone This list may not describe all possible interactions. Give your health care provider a list of all the medicines, herbs, non-prescription drugs, or dietary supplements you use. Also tell them if you smoke, drink alcohol, or use illegal drugs. Some items may interact with your medicine.  What should I watch for while using this medicine? Tell your doctor if your symptoms do not get better or if they get worse. Visit your doctor or health care professional for regular checks on your progress. Because it may take several weeks to see the full effects of this medicine, it is important to continue your treatment as prescribed by your doctor. Patients and their families should watch out for new or worsening thoughts of suicide or depression.  Also watch out for sudden changes in feelings such as feeling anxious, agitated, panicky, irritable, hostile, aggressive, impulsive, severely restless, overly excited and hyperactive, or not being able to sleep. If this happens, especially at the beginning of treatment or after a change in dose, call your health care professional. Dennis Bast may get drowsy or dizzy. Do not drive, use machinery, or do anything that needs mental alertness until you know how this medicine affects you. Do not stand or sit up quickly, especially if you are an older patient. This reduces the risk of dizzy or fainting spells. Alcohol may interfere with the effect of this medicine. Avoid alcoholic drinks. Do not treat yourself for coughs, colds, or allergies without asking your doctor or health care professional for advice. Some ingredients can increase possible side effects. Your mouth may get dry. Chewing sugarless gum or sucking hard candy, and drinking plenty of water will help. Contact your doctor if the problem does not go away or is severe. This medicine may cause dry eyes and blurred vision. If you wear contact lenses you may feel some discomfort. Lubricating drops may help. See your eye doctor if the problem does not go away or is severe. This medicine can cause constipation. Try to have a bowel movement at least every 2 to 3 days. If you do not have a bowel movement for 3 days, call your doctor or health care professional. This medicine can make you more sensitive to the sun. Keep out of the sun. If you cannot avoid being in the sun, wear protective clothing and use sunscreen. Do not use sun lamps or tanning beds/booths. What side effects may I notice from receiving this medicine? Side effects that you should report to your doctor or health care professional as soon as possible:  allergic reactions like skin rash, itching or hives, swelling of the face, lips, or tongue  anxious  breathing problems  changes in  vision  confusion  elevated mood, decreased need for sleep, racing thoughts, impulsive behavior  eye pain  fast, irregular heartbeat  feeling faint or lightheaded, falls  feeling agitated, angry, or irritable  fever with increased sweating  hallucination, loss of contact with reality  seizures  stiff muscles  suicidal thoughts or other mood changes  tingling, pain, or numbness in the feet or hands  trouble passing urine or change in the amount of urine  trouble sleeping  unusually weak or tired  vomiting  yellowing of the eyes or skin Side effects that usually do not require medical attention (report to your doctor or health care professional if they continue or are bothersome):  change in sex drive or performance  change in appetite or weight  constipation  dizziness  dry mouth  nausea  tired  tremors  upset stomach This list may not describe all possible side effects. Call your doctor for medical advice about side effects. You may report side effects to FDA at 1-800-FDA-1088. Where should I keep my medicine? Keep out of the reach of children. Store at  room temperature between 20 and 25 degrees C (68 and 77 degrees F). Throw away any unused medicine after the expiration date. NOTE: This sheet is a summary. It may not cover all possible information. If you have questions about this medicine, talk to your doctor, pharmacist, or health care provider.  2020 Elsevier/Gold Standard (2018-02-28 13:04:32) Eptinezumab injection What is this medicine? EPTINEZUMAB (EP ti NEZ ue mab) is used to prevent migraine headaches. This medicine may be used for other purposes; ask your health care provider or pharmacist if you have questions. COMMON BRAND NAME(S): Vyepti What should I tell my health care provider before I take this medicine? They need to know if you have any of these conditions:  an unusual or allergic reaction to eptinezumab, other medicines, foods,  dyes, or preservatives  pregnant or trying to get pregnant  breast-feeding How should I use this medicine? This medicine is for infusion into a vein. It is given by a health care professional in a hospital or clinic setting. Talk to your pediatrician about the use of this medicine in children. Special care may be needed. Overdosage: If you think you have taken too much of this medicine contact a poison control center or emergency room at once. NOTE: This medicine is only for you. Do not share this medicine with others. What if I miss a dose? Keep appointments for follow-up doses. It is important not to miss your dose. Call your doctor or health care professional if you are unable to keep an appointment. What may interact with this medicine? Interactions are not expected. This list may not describe all possible interactions. Give your health care provider a list of all the medicines, herbs, non-prescription drugs, or dietary supplements you use. Also tell them if you smoke, drink alcohol, or use illegal drugs. Some items may interact with your medicine. What should I watch for while using this medicine? Your condition will be monitored carefully while you are receiving this medicine. What side effects may I notice from receiving this medicine? Side effects that you should report to your doctor or health care professional as soon as possible:  allergic reactions like skin rash, itching or hives; swelling of the face, lips, or tongue Side effects that usually do not require medical attention (report these to your doctor or health care professional if they continue or are bothersome):  sore throat This list may not describe all possible side effects. Call your doctor for medical advice about side effects. You may report side effects to FDA at 1-800-FDA-1088. Where should I keep my medicine? This medicine is given in a hospital or clinic and will not be stored at home. NOTE: This sheet is a  summary. It may not cover all possible information. If you have questions about this medicine, talk to your doctor, pharmacist, or health care provider.  2020 Elsevier/Gold Standard (2018-05-16 20:21:59)  Rimegepant oral dissolving tablet What is this medicine? RIMEGEPANT (ri ME je pant) is used to treat migraine headaches with or without aura. An aura is a strange feeling or visual disturbance that warns you of an attack. It is not used to prevent migraines. This medicine may be used for other purposes; ask your health care provider or pharmacist if you have questions. COMMON BRAND NAME(S): NURTEC ODT What should I tell my health care provider before I take this medicine? They need to know if you have any of these conditions:  kidney disease  liver disease  an unusual or allergic reaction to  rimegepant, other medicines, foods, dyes, or preservatives  pregnant or trying to get pregnant  breast-feeding How should I use this medicine? Take the medicine by mouth. Follow the directions on the prescription label. Leave the tablet in the sealed blister pack until you are ready to take it. With dry hands, open the blister and gently remove the tablet. If the tablet breaks or crumbles, throw it away and take a new tablet out of the blister pack. Place the tablet in the mouth and allow it to dissolve, and then swallow. Do not cut, crush, or chew this medicine. You do not need water to take this medicine. Talk to your pediatrician about the use of this medicine in children. Special care may be needed. Overdosage: If you think you have taken too much of this medicine contact a poison control center or emergency room at once. NOTE: This medicine is only for you. Do not share this medicine with others. What if I miss a dose? This does not apply. This medicine is not for regular use. What may interact with this medicine? This medicine may interact with the following medications:  certain medicines  for fungal infections like fluconazole, itraconazole  rifampin This list may not describe all possible interactions. Give your health care provider a list of all the medicines, herbs, non-prescription drugs, or dietary supplements you use. Also tell them if you smoke, drink alcohol, or use illegal drugs. Some items may interact with your medicine. What should I watch for while using this medicine? Visit your health care professional for regular checks on your progress. Tell your health care professional if your symptoms do not start to get better or if they get worse. What side effects may I notice from receiving this medicine? Side effects that you should report to your doctor or health care professional as soon as possible:  allergic reactions like skin rash, itching or hives; swelling of the face, lips, or tongue Side effects that usually do not require medical attention (report these to your doctor or health care professional if they continue or are bothersome):  nausea This list may not describe all possible side effects. Call your doctor for medical advice about side effects. You may report side effects to FDA at 1-800-FDA-1088. Where should I keep my medicine? Keep out of the reach of children. Store at room temperature between 15 and 30 degrees C (59 and 86 degrees F). Throw away any unused medicine after the expiration date. NOTE: This sheet is a summary. It may not cover all possible information. If you have questions about this medicine, talk to your doctor, pharmacist, or health care provider.  2020 Elsevier/Gold Standard (2018-05-22 00:21:31)

## 2019-04-04 ENCOUNTER — Other Ambulatory Visit: Payer: Self-pay

## 2019-04-04 ENCOUNTER — Ambulatory Visit
Admission: RE | Admit: 2019-04-04 | Discharge: 2019-04-04 | Disposition: A | Discharge status: Home / Self Care | Payer: 59 | Source: Ambulatory Visit | Attending: Neurology | Admitting: Neurology

## 2019-04-04 VITALS — BP 121/69 | HR 70

## 2019-04-04 DIAGNOSIS — R519 Headache, unspecified: Secondary | ICD-10-CM

## 2019-04-04 DIAGNOSIS — G8929 Other chronic pain: Secondary | ICD-10-CM

## 2019-04-04 LAB — CSF CELL COUNT WITH DIFFERENTIAL
RBC Count, CSF: 0 cells/uL
WBC, CSF: 0 cells/uL (ref 0–5)

## 2019-04-04 LAB — GLUCOSE, CSF: Glucose, CSF: 62 mg/dL (ref 40–80)

## 2019-04-04 LAB — PROTEIN, CSF: Total Protein, CSF: 34 mg/dL (ref 15–45)

## 2019-04-04 NOTE — Discharge Instructions (Signed)
Lumbar Puncture Discharge Instructions  1. Go home and rest quietly for the next 24 hours.  It is important to lie flat for the next 24 hours.  Get up only to go to the restroom.  You may lie in the bed or on a couch on your back, your stomach, your left side or your right side.  You may have one pillow under your head.  You may have pillows between your knees while you are on your side or under your knees while you are on your back.  2. DO NOT drive today.  Recline the seat as far back as it will go, while still wearing your seat belt, on the way home.  3. You may get up to go to the bathroom as needed.  You may sit up for 10 minutes to eat.  You may resume your normal diet and medications unless otherwise indicated.  Drink plenty of extra fluids today and tomorrow.  4. The incidence of a spinal headache with nausea and/or vomiting is about 5% (one in 20 patients).  If you develop a headache, lie flat and drink plenty of fluids until the headache goes away.  Caffeinated beverages may be helpful.  If you develop severe nausea and vomiting or a headache that does not go away with flat bed rest, please call the physician who sent you here.  5. You may resume normal activities after your 24 hours of bed rest is over; however, do not exert yourself strongly or do any heavy lifting tomorrow.  Please call us at [phone] with any questions or concerns.  6. Call your physician for a follow-up appointment.    You may resume Xarelto today.

## 2019-04-05 ENCOUNTER — Telehealth: Payer: Self-pay | Admitting: *Deleted

## 2019-04-05 ENCOUNTER — Encounter: Payer: Self-pay | Admitting: *Deleted

## 2019-04-05 NOTE — Telephone Encounter (Signed)
Completed Nurtec 75 mg PA on CMM. [id number]. Awaiting [facility] determination.

## 2019-04-05 NOTE — Telephone Encounter (Signed)
Per [facility], Vyepti denied.  "The requested medication and/or diagnosis are not a covered benefit and excluded from coverage in accordance with the terms and conditions of your plan benefit. Therefore, the request has been administratively denied."

## 2019-04-05 NOTE — Telephone Encounter (Signed)
Vyepti 100 mg PA completed on CMM. Key: [id number]. Awaiting [facility] rx determination.

## 2019-04-11 NOTE — Progress Notes (Signed)
[place]   [phone]) [phone] [phone]) [phone]   Clinic Follow up Note   Patient Care Team: Maurice Small, MD as PCP - General (Family Medicine) Josue Hector, MD as PCP - Cardiology (Cardiology)  Date of Service:  04/18/2019  CHIEF COMPLAINT: F/u of iron deficient anemia  CURRENT THERAPY:  iv feraheme as needed (if ferritin<20 or low iron level), last on 05/03/18  INTERVAL HISTORY:  Melissa [name] is here for a follow up of IDA. She was last seen by me 1 year ago. She presents to the clinic alone. She notes she is doing well. She notes she has been changing her HA medication. She notes no change in her energy level from poor sleeping. She averages less than 5 hours of sleep.  She had gastric bypass in 2001 and did revision later on. She plans to see her PCP in 7 months.    REVIEW OF SYSTEMS:   Constitutional: Denies fevers, chills or abnormal weight loss Eyes: Denies blurriness of vision Ears, nose, mouth, throat, and face: Denies mucositis or sore throat Respiratory: Denies cough, dyspnea or wheezes Cardiovascular: Denies palpitation, chest discomfort or lower extremity swelling Gastrointestinal:  Denies nausea, heartburn or change in bowel habits Skin: Denies abnormal skin rashes Lymphatics: Denies new lymphadenopathy or easy bruising Neurological:Denies numbness, tingling or new weaknesses Behavioral/Psych: Mood is stable, no new changes  All other systems were reviewed with the patient and are negative.  MEDICAL HISTORY:  Past Medical History:  Diagnosis Date   (HFpEF) heart failure with preserved ejection fraction (HCC)    ADD (attention deficit disorder)    Allergy    Arthritis    Asthma    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16   Depression    Family history of breast cancer    Family history of thyroid cancer    Family history of uterine cancer    Migraine    Osteoporosis    Pulmonary hypertension (HCC)    RLS (restless legs syndrome)       SURGICAL HISTORY: Past Surgical History:  Procedure Laterality Date   ABDOMINAL HYSTERECTOMY     CARDIOVERSION N/A 01/10/2017   Procedure: CARDIOVERSION;  Surgeon: Dorothy Spark, MD;  Location: [place];  Service: Cardiovascular;  Laterality: N/A;   CHOLECYSTECTOMY     EXCISION OF ATRIAL MYXOMA Left 12/28/2016   Procedure: EXCISION OF LEFT ATRIAL MYXOMA;  Surgeon: Ivin Poot, MD;  Location: [place];  Service: Open Heart Surgery;  Laterality: Left;   FOOT SURGERY     GASTRIC BYPASS     INTRAOPERATIVE TRANSESOPHAGEAL ECHOCARDIOGRAM N/A 12/28/2016   Procedure: INTRAOPERATIVE TRANSESOPHAGEAL ECHOCARDIOGRAM;  Surgeon: Ivin Poot, MD;  Location: [place];  Service: Open Heart Surgery;  Laterality: N/A;   TEE WITHOUT CARDIOVERSION N/A 01/10/2017   Procedure: TRANSESOPHAGEAL ECHOCARDIOGRAM (TEE);  Surgeon: Dorothy Spark, MD;  Location[facility] ENDOSCOPY;  Service: Cardiovascular;  Laterality: N/A;   TEE WITHOUT CARDIOVERSION N/A 05/17/2017   Procedure: TRANSESOPHAGEAL ECHOCARDIOGRAM (TEE);  Surgeon: Josue Hector, MD;  Location: [facility] ENDOSCOPY;  Service: Cardiovascular;  Laterality: N/A;   TOTAL KNEE ARTHROPLASTY     TUBAL LIGATION      I have reviewed the social history and family history with the patient and they are unchanged from previous note.  ALLERGIES:  is allergic to other.  MEDICATIONS:  Current Outpatient Medications  Medication Sig Dispense Refill   acetaZOLAMIDE (DIAMOX) 250 MG tablet Take 1 tablet (250 mg total) by mouth 2 (two) times daily.  60 tablet 6   almotriptan (AXERT) 12.5 MG tablet SMARTSIG:1 Tablet(s) By Mouth 1 to 2 Times Daily     amitriptyline (ELAVIL) 25 MG tablet Take 1-2 tablets (25-50 mg total) by mouth at bedtime. 60 tablet 3   Cholecalciferol (VITAMIN D3) 50000 units CAPS Take 1 capsule by mouth once a week.     flecainide (TAMBOCOR) 50 MG tablet Take 1 tablet by mouth twice daily. 180 tablet 1   furosemide (LASIX) 40 MG  tablet As needed for weight gain 30 tablet 3   HYDROcodone-acetaminophen (NORCO) 10-325 MG tablet Take 1 tablet by mouth 2 (two) times daily as needed.     metoprolol tartrate (LOPRESSOR) 25 MG tablet Take 1 and 1/2 tablets by mouth twice daily. 270 tablet 1   Multiple Vitamin (MULTIVITAMIN) tablet Take 1 tablet by mouth 2 (two) times daily.      potassium chloride SA (K-DUR,KLOR-CON) 20 MEQ tablet As needed for weight gain with Lasix 30 tablet 3   pramipexole (MIRAPEX) 1 MG tablet Take 1 mg by mouth 2 (two) times daily.      promethazine (PHENERGAN) 12.5 MG tablet Take 1-2 tablets by mouth 3 (three) times daily as needed for nausea/vomiting.  5   Rimegepant Sulfate (NURTEC) 75 MG TBDP Take 75 mg by mouth daily as needed. For migraines. Take as close to onset of migraine as possible. One daily maximum. 10 tablet 6   rizatriptan (MAXALT-MLT) 10 MG disintegrating tablet Take 1 tablet (10 mg total) by mouth as needed for migraine. May repeat in 2 hours if needed 9 tablet 11   tiZANidine (ZANAFLEX) 2 MG tablet Take 2 mg by mouth every 6 (six) hours as needed (headache).   5   XARELTO 20 MG TABS tablet Take 1 tablet by mouth daily. 90 tablet 1   diltiazem (CARDIZEM) 30 MG tablet Take 1 tablet (30 mg total) every 6 (six) hours as needed by mouth. For fast heart rate. Continue long acting diltiazem, total of no more than 360 mg per day 30 tablet 0   No current facility-administered medications for this visit.    PHYSICAL EXAMINATION: ECOG PERFORMANCE STATUS: 1 - Symptomatic but completely ambulatory  Vitals:   04/18/19 0928  BP: 135/78  Pulse: 67  Resp: 18  Temp: 97.8 F (36.6 C)  SpO2: 100%   Filed Weights   04/18/19 0928  Weight: 217 lb 9.6 oz (98.7 kg)    Due to COVID19 we will limit examination to appearance. Patient had no complaints.  GENERAL:alert, no distress and comfortable SKIN: skin color normal, no rashes or significant lesions EYES: normal, Conjunctiva are pink  and non-injected, sclera clear  NEURO: alert & oriented x 3 with fluent speech   LABORATORY DATA:  I have reviewed the data as listed CBC Latest Ref Rng & Units 04/18/2019 12/20/2018 04/19/2018  WBC 4.0 - 10.5 K/uL 7.6 8.5 5.6  Hemoglobin 12.0 - 15.0 g/dL 14.5 13.9 13.4  Hematocrit 36.0 - 46.0 % 43.0 41.4 41.6  Platelets 150 - 400 K/uL 338 284 303     CMP Latest Ref Rng & Units 04/18/2019 12/20/2018 04/19/2018  Glucose 70 - 99 mg/dL 92 109(H) 89  BUN 6 - 20 mg/dL 12 11 10   Creatinine 0.44 - 1.00 mg/dL 0.76 0.78 0.75  Sodium 135 - 145 mmol/L 142 142 141  Potassium 3.5 - 5.1 mmol/L 3.6 4.1 4.5  Chloride 98 - 111 mmol/L 110 109 105  CO2 22 - 32 mmol/L 22 24 26  Calcium 8.9 - 10.3 mg/dL 9.1 9.7 9.8  Total Protein 6.5 - 8.1 g/dL 7.8 7.7 7.6  Total Bilirubin 0.3 - 1.2 mg/dL 0.6 0.4 0.6  Alkaline Phos 38 - 126 U/L 69 69 73  AST 15 - 41 U/L 15 20 22   ALT 0 - 44 U/L 13 23 22       RADIOGRAPHIC STUDIES: I have personally reviewed the radiological images as listed and agreed with the findings in the report. No results found.   ASSESSMENT & PLAN:  Melissa [name] is a 54 y.o. female with    1. Iron Deficiency Anemia, secondary to gastric by-pass surgery -Shehas chronic mild anemia, she hadgastric by pass surgery in 2001,and a revision again in July 2019.Her lab work is consistent with iron deficiency. I discussed her iron deficiency anemia is likely related to poor iron absorption after the gastric surgery. -Patient has been having tiredness and fatigue, but attributes this to her trouble sleeping.  -She has not been able to tolerate oral iron due to stomach irritation. I recommend she try supplements with less iron in it. I recommend prenatal vitamin. She is agreeable.  -From a IDA standpoint she is stable. Labs reviewed, anemia resolved. Iron panel still pending. She has not required IV iron since 05/03/18.  -F/u in 1 year with labs in 4 months.   2.Persistent atrial  fibrillation -Rare palpitations reported by Ms. Olmedo but no symptoms to suggest significant recurrence of atrial fibrillation. -She remains on metoprolol tartrate and rivaroxaban she has diltiazem to take as needed for symptoms.Dr. Royal Hawthorn changes at this time.  -Continue to follow-up withDr. Kendra Opitz continuerivaroxaban  3.Left atrial myxoma status post resection -Other than anepisode of leg edemain April 2019, Ms. Macmillan has done very well without symptoms to suggest recurrent heart failure which was her presenting clinical picture from her myxoma. -She appears euvolemic and well compensated onpreviousexam . -[name] will repeat TEE in the February showed resolution of aneurysmal dilation of the atrial septal patch and adjacent thrombus.  4.Family history of malignancy, genetic testing was normal -She is scheduled to have screening mammogram in 04/2018.  -Given her significant family history of breast cancer she is interested in further screening.  -Breast MRI  in 11/2018 was normal, continue every 2 years. She is due for Mammogram in 03/2019 at [facility], she will call to schedule, continue yearly.  -She is planning for next colonoscopy in 2022.   PLAN: -Lab in 4 months  -Lab and F/u with NP Lacie in 1 year  -Start prenatal vitamins  -she will have lab and see PCP in 7-8 months    No problem-specific Assessment & Plan notes found for this encounter.   No orders of the defined types were placed in this encounter.  All questions were answered. The patient knows to call the clinic with any problems, questions or concerns. No barriers to learning was detected. The total time spent in the appointment was 20 minutes.     Truitt Merle, MD 04/18/2019   I, Joslyn Devon, am acting as scribe for Truitt Merle, MD.   I have reviewed the above documentation for accuracy and completeness, and I agree with the above.

## 2019-04-12 NOTE — Telephone Encounter (Signed)
Per CMM:   Request Reference Number: [id number]. NURTEC TAB 75MG  ODT is denied for not meeting the prior authorization requirement(s). For further questions, call [phone]. Appeals are not supported through [place]. Please refer to the fax case notice for appeals information and instructions.   Pt should be able to use the savings card Despite denial.

## 2019-04-13 ENCOUNTER — Other Ambulatory Visit: Payer: Self-pay | Admitting: Neurology

## 2019-04-13 MED ORDER — ACETAZOLAMIDE 250 MG PO TABS: 250.0000 mg | ORAL_TABLET | Freq: Two times a day (BID) | ORAL | 6 refills | Status: DC

## 2019-04-17 ENCOUNTER — Other Ambulatory Visit: Payer: Self-pay | Admitting: Neurology

## 2019-04-17 MED ORDER — RIZATRIPTAN BENZOATE 10 MG PO TBDP: 10.0000 mg | ORAL_TABLET | ORAL | 11 refills | Status: AC | PRN

## 2019-04-18 ENCOUNTER — Encounter: Payer: Self-pay | Admitting: Hematology

## 2019-04-18 ENCOUNTER — Inpatient Hospital Stay: Payer: 59 | Admitting: Hematology

## 2019-04-18 ENCOUNTER — Ambulatory Visit: Payer: 59 | Admitting: Hematology

## 2019-04-18 ENCOUNTER — Other Ambulatory Visit: Payer: 59

## 2019-04-18 ENCOUNTER — Other Ambulatory Visit: Payer: Self-pay

## 2019-04-18 ENCOUNTER — Inpatient Hospital Stay: Payer: 59 | Attending: Hematology

## 2019-04-18 VITALS — BP 135/78 | HR 67 | Temp 97.8°F | Resp 18 | Ht 66.0 in | Wt 217.6 lb

## 2019-04-18 DIAGNOSIS — Z9884 Bariatric surgery status: Secondary | ICD-10-CM | POA: Insufficient documentation

## 2019-04-18 DIAGNOSIS — Z803 Family history of malignant neoplasm of breast: Secondary | ICD-10-CM | POA: Diagnosis not present

## 2019-04-18 DIAGNOSIS — Z79899 Other long term (current) drug therapy: Secondary | ICD-10-CM | POA: Insufficient documentation

## 2019-04-18 DIAGNOSIS — M129 Arthropathy, unspecified: Secondary | ICD-10-CM | POA: Insufficient documentation

## 2019-04-18 DIAGNOSIS — I4819 Other persistent atrial fibrillation: Secondary | ICD-10-CM | POA: Insufficient documentation

## 2019-04-18 DIAGNOSIS — I5032 Chronic diastolic (congestive) heart failure: Secondary | ICD-10-CM | POA: Insufficient documentation

## 2019-04-18 DIAGNOSIS — D509 Iron deficiency anemia, unspecified: Secondary | ICD-10-CM

## 2019-04-18 DIAGNOSIS — Z7901 Long term (current) use of anticoagulants: Secondary | ICD-10-CM | POA: Insufficient documentation

## 2019-04-18 DIAGNOSIS — R5383 Other fatigue: Secondary | ICD-10-CM | POA: Diagnosis not present

## 2019-04-18 DIAGNOSIS — M810 Age-related osteoporosis without current pathological fracture: Secondary | ICD-10-CM | POA: Diagnosis not present

## 2019-04-18 DIAGNOSIS — Z809 Family history of malignant neoplasm, unspecified: Secondary | ICD-10-CM | POA: Insufficient documentation

## 2019-04-18 LAB — CBC WITH DIFFERENTIAL (CANCER CENTER ONLY)
Abs Immature Granulocytes: 0.03 10*3/uL (ref 0.00–0.07)
Basophils Absolute: 0.1 10*3/uL (ref 0.0–0.1)
Basophils Relative: 1 %
Eosinophils Absolute: 0.2 10*3/uL (ref 0.0–0.5)
Eosinophils Relative: 2 %
HCT: 43 % (ref 36.0–46.0)
Hemoglobin: 14.5 g/dL (ref 12.0–15.0)
Immature Granulocytes: 0 %
Lymphocytes Relative: 25 %
Lymphs Abs: 1.9 10*3/uL (ref 0.7–4.0)
MCH: 30 pg (ref 26.0–34.0)
MCHC: 33.7 g/dL (ref 30.0–36.0)
MCV: 88.8 fL (ref 80.0–100.0)
Monocytes Absolute: 0.6 10*3/uL (ref 0.1–1.0)
Monocytes Relative: 7 %
Neutro Abs: 4.8 10*3/uL (ref 1.7–7.7)
Neutrophils Relative %: 65 %
Platelet Count: 338 10*3/uL (ref 150–400)
RBC: 4.84 MIL/uL (ref 3.87–5.11)
RDW: 14.1 % (ref 11.5–15.5)
WBC Count: 7.6 10*3/uL (ref 4.0–10.5)
nRBC: 0 % (ref 0.0–0.2)

## 2019-04-18 LAB — CMP (CANCER CENTER ONLY)
ALT: 13 U/L (ref 0–44)
AST: 15 U/L (ref 15–41)
Albumin: 4.3 g/dL (ref 3.5–5.0)
Alkaline Phosphatase: 69 U/L (ref 38–126)
Anion gap: 10 (ref 5–15)
BUN: 12 mg/dL (ref 6–20)
CO2: 22 mmol/L (ref 22–32)
Calcium: 9.1 mg/dL (ref 8.9–10.3)
Chloride: 110 mmol/L (ref 98–111)
Creatinine: 0.76 mg/dL (ref 0.44–1.00)
GFR, Est AFR Am: >60 mL/min (ref >60)
GFR, Estimated: >60 mL/min (ref >60)
Glucose, Bld: 92 mg/dL (ref 70–99)
Potassium: 3.6 mmol/L (ref 3.5–5.1)
Sodium: 142 mmol/L (ref 135–145)
Total Bilirubin: 0.6 mg/dL (ref 0.3–1.2)
Total Protein: 7.8 g/dL (ref 6.5–8.1)

## 2019-04-18 LAB — FERRITIN: Ferritin: 12 ng/mL (ref 11–307)

## 2019-04-18 LAB — IRON AND TIBC
Iron: 87 ug/dL (ref 41–142)
Saturation Ratios: 17 % — ABNORMAL LOW (ref 21–57)
TIBC: 515 ug/dL — ABNORMAL HIGH (ref 236–444)
UIBC: 428 ug/dL — ABNORMAL HIGH (ref 120–384)

## 2019-04-19 ENCOUNTER — Telehealth: Payer: Self-pay | Admitting: Hematology

## 2019-04-19 NOTE — Telephone Encounter (Signed)
I spoke with a rep with [employer], The savings program only works when [employer] approves the [employer]. Please advise if you would like to appeal the decision.

## 2019-04-19 NOTE — Telephone Encounter (Signed)
Let patient know we cannot get this approved for her thanks

## 2019-04-19 NOTE — Telephone Encounter (Signed)
Scheduled appt per 1/27 los.  Sent a message to HIM pool to get a calendar mailed out.

## 2019-04-23 ENCOUNTER — Telehealth: Payer: Self-pay | Admitting: Hematology

## 2019-04-23 ENCOUNTER — Other Ambulatory Visit: Payer: Self-pay | Admitting: Hematology

## 2019-04-23 NOTE — Telephone Encounter (Signed)
Scheduled appt per 1/27 sch message -- pt aware of apt date and time

## 2019-04-24 ENCOUNTER — Telehealth: Payer: Self-pay

## 2019-04-24 NOTE — Telephone Encounter (Signed)
Sent mychart message

## 2019-04-24 NOTE — Telephone Encounter (Signed)
I spoke with Melissa [name] and let her know her insurance company is requiring [place] to use a different IV iron medication that requires weekly infusion for three weeks.  I told her additional appointments will be made.  She verbalized understanding.

## 2019-04-25 ENCOUNTER — Inpatient Hospital Stay: Payer: 59 | Attending: Hematology

## 2019-04-25 ENCOUNTER — Other Ambulatory Visit: Payer: Self-pay

## 2019-04-25 VITALS — BP 133/76 | HR 71 | Temp 98.0°F | Resp 18

## 2019-04-25 DIAGNOSIS — D509 Iron deficiency anemia, unspecified: Secondary | ICD-10-CM | POA: Diagnosis not present

## 2019-04-25 DIAGNOSIS — Z79899 Other long term (current) drug therapy: Secondary | ICD-10-CM | POA: Diagnosis not present

## 2019-04-25 DIAGNOSIS — D508 Other iron deficiency anemias: Secondary | ICD-10-CM

## 2019-04-25 MED ORDER — SODIUM CHLORIDE 0.9 % IV SOLN
200.0000 mg | Freq: Once | INTRAVENOUS | Status: AC
  Administered 2019-04-25: 09:00:00 200 mg via INTRAVENOUS
  Filled 2019-04-25: qty 200

## 2019-04-25 MED ORDER — SODIUM CHLORIDE 0.9 % IV SOLN
Freq: Once | INTRAVENOUS | Status: AC
  Filled 2019-04-25: qty 250

## 2019-04-25 NOTE — Patient Instructions (Signed)

## 2019-04-30 NOTE — Progress Notes (Signed)
Cardiology Office Note:    Date:  05/04/2019   ID:  Melissa [name], DOB Jul 07, 1965, MRN [id number]  PCP:  Melissa Small, MD  Cardiologist:  Melissa Rouge, MD   Electrophysiologist:  None   Referring MD: Melissa Small, MD   No chief complaint on file.    History of Present Illness:    Melissa [name] is a 54 y.o. female   She underewent resection of the atrial myxoma with atrial septal pericardial patch in [id number] complicated by [name] op atrial fibrillation.  She has been continued on anticoagulation with Rivaroxaban.  PASP has returned to normal since her resection.   She has an apple watch and has had some bouts of PAF and PAC;s since surgery    Lastt monitor done 01/27/18 showed no PAF. TEE 05/17/17 with intact septal patch and no recurrent atrial myxoma  Limited palpitations only at night when she first lays down Has not had to take PRN cardizem   Son Melissa [name] transferred to [facility] and now [place] wants to be a marshal Younger son Melissa [name] is freshman at [employer]  Having some cervical neck issues may need injection Discussed holding xarelto for 3 days prior to procedure She had LP for intractable headache 04/04/19 with opening pressure only slightly elevated at 19 cm H20  She is taking estrogen which really helps her menopausal symptoms Since she is on anticoagulation risk of stroke should be less   Prior CV studies:   The following studies were reviewed today:  TEE 05/17/17 EF 55-60, trivial MR, no residual atrial myxoma, BAE, [name] PFO, pericardial septal patch intact, bubble study negative  48 Hr Holter 02/08/17 Sinus rhythm with rare PACs and PVCs. Single 4 beat atrial run. No atrial fibrillation or other sustained arrhythmia.  Echo 02/07/17 EF 55-60, no RWMA, mild LAE, neg bubble study, mild TR, normal PASP  TEE 01/10/17 EF 55-60, no RWMA, trivial AI, mild MR, severe BAE, severe TR, PASP 48, interatrial patch significantly thickened with possible dehiscence and  thrombus  R/L heart cath 12/27/16 LM normal LAD min irregs LCx ost 10 RCA normal  RA (mean): 11 mmHg RV (S/EDP): 71/13 mmHg PA (S/D, mean): 68/33 (47) mmHg PCWP (mean): 27 mmHg Ao sat: 99% PA sat: 73% Fick CO: 7.9 L/min Fick CI: 3.7 L/min/m^2 PVR: 2.5 Wood units Conclusions: 1. Minimal atherosclerotic plaquing; no angiographically significant coronary artery disease. 2. Hypervascular, mobile structure supplied predominantly by the right coronary artery, consistent with left atrial mass seen on recent echo. 3. Moderately elevated left and right heart filling pressures. 4. Severe pulmonary hypertension. 5. Normal Fick cardiac output/index.  PreCABG Dopplers 12/27/16 1. Carotid Doppler Evaluation - findings are suggestive of 1-39%  diameter reduction bilaterally. There is moderate tortuosity of  the left internal carotid artery noted.  Echo 12/24/16 EF 55-60, no RWMA, mod MR, MV gradients severely elevated due to L atrial mass (mean 20), severea LAE, larg (2.9 x 4.5 cm) mass, mod TR, PASP 89  Past Medical History:  Diagnosis Date  . (HFpEF) heart failure with preserved ejection fraction ([place])   . ADD (attention deficit disorder)   . Allergy   . Arthritis   . Asthma   . 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 Depression   . Family history of breast cancer   . Family history of thyroid cancer   . Family history of uterine cancer   . Migraine   . Osteoporosis   . Pulmonary hypertension ([place])   . RLS (restless legs syndrome)  Surgical Hx: The patient  has a past surgical history that includes Cholecystectomy; Tubal ligation; Abdominal hysterectomy; Foot surgery; Total knee arthroplasty; Gastric bypass; Excision of atrial myxoma (Left, 12/28/2016); Intraoprative transesophageal echocardiogram (N/A, 12/28/2016); TEE without cardioversion (N/A, 01/10/2017); Cardioversion (N/A, 01/10/2017); and TEE without cardioversion (N/A, 05/17/2017).   Current Medications: Current Meds  Medication Sig   . acetaZOLAMIDE (DIAMOX) 250 MG tablet Take 1 tablet (250 mg total) by mouth 2 (two) times daily.  Marland Kitchen almotriptan (AXERT) 12.5 MG tablet SMARTSIG:1 Tablet(s) By Mouth 1 to 2 Times Daily  . amitriptyline (ELAVIL) 25 MG tablet Take 1-2 tablets (25-50 mg total) by mouth at bedtime.  . Cholecalciferol (VITAMIN D3) 50000 units CAPS Take 1 capsule by mouth once a week.  . flecainide (TAMBOCOR) 50 MG tablet Take 1 tablet by mouth twice daily.  . furosemide (LASIX) 40 MG tablet As needed for weight gain  . HYDROcodone-acetaminophen (NORCO) 10-325 MG tablet Take 1 tablet by mouth 2 (two) times daily as needed.  . metoprolol tartrate (LOPRESSOR) 25 MG tablet Take 1 and 1/2 tablets by mouth twice daily.  . Multiple Vitamin (MULTIVITAMIN) tablet Take 1 tablet by mouth 2 (two) times daily.   . potassium chloride SA (K-DUR,KLOR-CON) 20 MEQ tablet As needed for weight gain with Lasix  . pramipexole (MIRAPEX) 1 MG tablet Take 1 mg by mouth 2 (two) times daily.   . promethazine (PHENERGAN) 12.5 MG tablet Take 1-2 tablets by mouth 3 (three) times daily as needed for nausea/vomiting.  . rizatriptan (MAXALT-MLT) 10 MG disintegrating tablet Take 1 tablet (10 mg total) by mouth as needed for migraine. May repeat in 2 hours if needed  . tiZANidine (ZANAFLEX) 2 MG tablet Take 2 mg by mouth every 6 (six) hours as needed (headache).   Alveda Reasons 20 MG TABS tablet Take 1 tablet by mouth daily.  . [DISCONTINUED] Rimegepant Sulfate (NURTEC) 75 MG TBDP Take 75 mg by mouth daily as needed. For migraines. Take as close to onset of migraine as possible. One daily maximum.     Allergies:   Other   Social History   Tobacco Use  . Smoking status: Former Smoker    Packs/day: 0.25    Years: 10.00    Pack years: 2.50    Types: Cigarettes    Quit date: 1997    Years since quitting: 24.1  . Smokeless tobacco: Never Used  Substance Use Topics  . Alcohol use: Yes    Alcohol/week: 3.0 - 5.0 standard drinks    Types: 3 - 5  Glasses of wine per week  . Drug use: No     Family Hx: The patient's family history includes Breast cancer in her maternal aunt, maternal aunt, and maternal grandmother; Depression in an other family member; Endometrial cancer (age of onset: 46) in her sister; Stroke in her paternal grandfather; Thyroid cancer in her mother. There is no history of Migraines.  ROS:   Please see the history of present illness.    Review of Systems  Constitution: Positive for diaphoresis.  Cardiovascular: Positive for irregular heartbeat.  Musculoskeletal: Positive for back pain and myalgias.  Neurological: Positive for headaches.   All other systems reviewed and are negative.   EKGs/Labs/Other Test Reviewed:    EKG:   01/02/18 SR rate 63 nonspecific ST changes 05/04/19 SR rate 69 nonspecific lateral T wave changes   Recent Labs: 04/18/2019: ALT 13; BUN 12; Creatinine 0.76; Hemoglobin 14.5; Platelet Count 338; Potassium 3.6; Sodium 142   Recent Lipid  Panel Lab Results  Component Value Date/Time   CHOL 145 06/21/2012 04:05 PM   TRIG 36.0 06/21/2012 04:05 PM   HDL 64.60 06/21/2012 04:05 PM   CHOLHDL 2 06/21/2012 04:05 PM   LDLCALC 73 06/21/2012 04:05 PM   LDLDIRECT 111.7 07/08/2008 08:39 AM    Physical Exam:    VS:  BP 132/68   Pulse 69   Ht 5\' 6"  (1.676 m)   Wt 222 lb 1.9 oz (100.8 kg)   SpO2 98%   BMI 35.85 kg/m     Wt Readings from Last 3 Encounters:  05/04/19 222 lb 1.9 oz (100.8 kg)  04/18/19 217 lb 9.6 oz (98.7 kg)  04/03/19 220 lb (99.8 kg)     Affect appropriate Overweight white female  HEENT: normal Neck supple with no adenopathy JVP normal no bruits no thyromegaly Lungs clear with no wheezing and good diaphragmatic motion Heart:  S1/S2  1/6/ SEM  murmur, no rub, gallop or click PMI normal [name] sternotomy  Abdomen: benighn, BS positve, no tenderness, no AAA no bruit.  No HSM or HJR Distal pulses intact with no bruits No edema Neuro non-focal Skin warm and dry No  muscular weakness   ASSESSMENT & PLAN:    Paroxysmal atrial fibrillation (HCC) Continue flecainide, beta blocker seems improved   History of atrial myxoma S/p resection.  Transesophageal echocardiogram in 04/2017 demonstrated intact pericardial septal patch and no residual myxoma.  Chronic heart failure with preserved ejection fraction (HCC) Overall, volume status stable.  Lasix as needed  .    Headaches:  F/u with neuro/primary [name] LP and cervical spine films not very remarkable      Dispo:  F/U 6 months   Melissa [name]

## 2019-05-01 ENCOUNTER — Telehealth: Payer: Self-pay | Admitting: *Deleted

## 2019-05-01 NOTE — Telephone Encounter (Signed)
Rizatriptan 10 mg PA completed on CMM to request 9 tablets per month vs the plan limits of 4 tablets. Pending [employer] [facility] determination. [id number].

## 2019-05-02 ENCOUNTER — Other Ambulatory Visit: Payer: Self-pay

## 2019-05-02 ENCOUNTER — Inpatient Hospital Stay: Payer: 59

## 2019-05-02 VITALS — BP 110/70 | HR 58 | Temp 98.4°F | Resp 16

## 2019-05-02 DIAGNOSIS — D508 Other iron deficiency anemias: Secondary | ICD-10-CM

## 2019-05-02 DIAGNOSIS — D509 Iron deficiency anemia, unspecified: Secondary | ICD-10-CM | POA: Diagnosis not present

## 2019-05-02 MED ORDER — SODIUM CHLORIDE 0.9 % IV SOLN
200.0000 mg | Freq: Once | INTRAVENOUS | Status: AC
  Administered 2019-05-02: 200 mg via INTRAVENOUS
  Filled 2019-05-02: qty 200

## 2019-05-02 MED ORDER — SODIUM CHLORIDE 0.9 % IV SOLN
Freq: Once | INTRAVENOUS | Status: AC
  Filled 2019-05-02: qty 250

## 2019-05-02 NOTE — Telephone Encounter (Signed)
Sent pt a Pharmacist, community message with options.

## 2019-05-02 NOTE — Telephone Encounter (Signed)
She always have the option to pay ut of pocket looking at goodrx thanks

## 2019-05-02 NOTE — Telephone Encounter (Signed)
Per Cover My Meds, plan has denied our request for a higher quantity of Rizatriptan ODT.  Per denial letter: The requested medication and/or diagnosis are not a covered benefit and excluded from coverage in accordance with the terms and conditions of your plan benefit. Therefore, the request has been administratively denied. [employer] cannot perform this prior authorization request because prior authorization is not required for the requested drug and medication quantities above the benefit limit are excluded under the plan.  If we should choose to appeal, Expedited / Urgent Fax: [phone].

## 2019-05-02 NOTE — Patient Instructions (Signed)

## 2019-05-04 ENCOUNTER — Ambulatory Visit: Payer: 59 | Admitting: Cardiovascular Disease

## 2019-05-04 ENCOUNTER — Other Ambulatory Visit: Payer: Self-pay

## 2019-05-04 ENCOUNTER — Encounter: Payer: Self-pay | Admitting: Cardiovascular Disease

## 2019-05-04 VITALS — BP 132/68 | HR 69 | Ht 66.0 in | Wt 222.1 lb

## 2019-05-04 DIAGNOSIS — D219 Benign neoplasm of connective and other soft tissue, unspecified: Secondary | ICD-10-CM

## 2019-05-04 DIAGNOSIS — I480 Paroxysmal atrial fibrillation: Secondary | ICD-10-CM

## 2019-05-04 NOTE — Patient Instructions (Signed)
Medication Instructions:   *If you need a refill on your cardiac medications before your next appointment, please call your pharmacy*  Lab Work:  If you have labs (blood work) drawn today and your tests are completely normal, you will receive your results only by: Marland Kitchen MyChart Message (if you have MyChart) OR . A paper copy in the mail If you have any lab test that is abnormal or we need to change your treatment, we will call you to review the results.  Testing/Procedures:   Follow-Up: At [facility], you and your health needs are our priority.  As part of our continuing mission to provide you with exceptional heart care, we have created designated Provider Care Teams.  These Care Teams include your primary Cardiologist (physician) and Advanced Practice Providers (APPs -  Physician Assistants and Nurse Practitioners) who all work together to provide you with the care you need, when you need it.  Your next appointment:   1 year(s)  The format for your next appointment:   In Person  Provider:   You may see Jenkins Rouge, MD or one of the following Advanced Practice Providers on your designated Care Team:    Truitt Merle, NP  Cecilie Kicks, NP  Kathyrn Drown, NP

## 2019-05-09 ENCOUNTER — Other Ambulatory Visit: Payer: Self-pay

## 2019-05-09 ENCOUNTER — Inpatient Hospital Stay: Payer: 59

## 2019-05-09 VITALS — BP 116/67 | HR 75 | Temp 98.0°F | Resp 16

## 2019-05-09 DIAGNOSIS — D509 Iron deficiency anemia, unspecified: Secondary | ICD-10-CM | POA: Diagnosis not present

## 2019-05-09 DIAGNOSIS — D508 Other iron deficiency anemias: Secondary | ICD-10-CM

## 2019-05-09 MED ORDER — SODIUM CHLORIDE 0.9 % IV SOLN
200.0000 mg | Freq: Once | INTRAVENOUS | Status: AC
  Administered 2019-05-09: 200 mg via INTRAVENOUS
  Filled 2019-05-09: qty 200

## 2019-05-09 MED ORDER — SODIUM CHLORIDE 0.9 % IV SOLN
Freq: Once | INTRAVENOUS | Status: AC
  Filled 2019-05-09: qty 250

## 2019-05-09 NOTE — Patient Instructions (Signed)

## 2019-05-10 ENCOUNTER — Other Ambulatory Visit: Payer: Self-pay | Admitting: Neurology

## 2019-05-10 MED ORDER — METHYLPREDNISOLONE 4 MG PO TBPK: ORAL_TABLET | ORAL | 1 refills | Status: DC

## 2019-05-14 ENCOUNTER — Encounter: Payer: Self-pay | Admitting: Physical Medicine and Rehabilitation

## 2019-05-15 NOTE — Telephone Encounter (Signed)
Pt messaged Melissa [name] in mychart stating her insurance medical benefit will allow a PA for Vypepti and it may be covered. I called her insurance and they require Melissa [name] to provide codes for the PA. Dr. Jaynee Eagles is aware and ok with Melissa [name] attempting a PA under pt's medical benefit. Given the fact that our office does not infuse this and does not have the codes, I have contacted pharmacist Jonni Sanger @ [facility] and LVM asking for call back to discuss the process.

## 2019-05-17 NOTE — Telephone Encounter (Signed)
I called Jonni Sanger [phone]), pharmacy staff, at [place] LVM asking for call back or if he could have someone else call back from the department that is aware of codes for a PA that we can use. Left office number in message for call back.

## 2019-05-22 ENCOUNTER — Other Ambulatory Visit: Payer: Self-pay | Admitting: Neurology

## 2019-05-22 MED ORDER — ACETAZOLAMIDE 250 MG PO TABS: 500.0000 mg | ORAL_TABLET | Freq: Two times a day (BID) | ORAL | 6 refills | Status: DC

## 2019-05-22 NOTE — Telephone Encounter (Signed)
Orders written for Depacon  1 gram IV x 1 and Toradol 30 mg IV x 1 per Dr. Jaynee Eagles. Orders signed by Dr. Jaynee Eagles and given to infusion nurses.

## 2019-05-22 NOTE — Progress Notes (Signed)
Melissa [name] - 54 y.o. female MRN [id number]  Date of birth: [birth year]  Office Visit Note: Visit Date: 03/29/2019 PCP: Maurice Small, MD Referred by: Maurice Small, MD  Subjective: Chief Complaint  Patient presents with  . Head - Pain   HPI:  Melissa [name] is a 54 y.o. female who comes in today For repeat second diagnostic C2-3 facet joint block.  Patient reports previous symptomatic relief of her occipital headache and upper cervical pain after injection within the day after the injection she had a severe headache but then ultimately had relief for several weeks.  She does report a lot of meaningful relief with the injection itself.  I think this is diagnostic and hopefully showing that the upper cervical spine is a source of her upper cervical pain in particular lower occipital headache type.  She continues to follow with Dr. Sarina Ill.  She has had no new issues since have seen her.  She has a history of atypical migraines and occipital cervicogenic headache and neck pain.  We will repeat the injection today if she does well with that I would look at radiofrequency ablation.  She has had all manner of conservative care with medication management and therapy.  ROS Otherwise per HPI.  Assessment & Plan: Visit Diagnoses:  1. Cervical spondylosis without myelopathy   2. Cervicogenic headache     Plan: No additional findings.   Meds & Orders:  Meds ordered this encounter  Medications  . methylPREDNISolone acetate (DEPO-MEDROL) injection 40 mg    Orders Placed This Encounter  Procedures  . Facet Injection  . XR C-ARM NO REPORT    Follow-up: Return for Review Pain Diary.   Procedures: No procedures performed  Diagnostic Cervical Facet Joint Nerve Block with Fluoroscopic Guidance  Patient: Dois Gentry      Date of Birth: 11-28-1965 MRN: [id number] PCP: Maurice Small, MD      Visit Date: 03/29/2019   Universal Protocol:    Date/Time: 05/22/2110:29 PM  Consent Given  By: the patient  Position: PRONE  Additional Comments: Vital signs were monitored before and after the procedure. Patient was prepped and draped in the usual sterile fashion. The correct patient, procedure, and site was verified.   Injection Procedure Details:  Procedure Site One Meds Administered:  Meds ordered this encounter  Medications  . methylPREDNISolone acetate (DEPO-MEDROL) injection 40 mg     Laterality: Bilateral  Location/Site:  C2-3  Needle size: 25 G  Needle type: Spinal  Needle Placement: Articular Pillar  Findings:  -Contrast Used: 0.5 mL iohexol 180 mg iodine/mL   -Comments: Excellent flow of contrast through the articular pillars without intravascular flow  Procedure Details: The fluoroscope beam was positioned to square off the endplates of the desired vertebral level to achieve a true AP position. The beam was then moved in a small "counter" oblique to the contralateral side with a small amount of caudal tilt to achieve a trajectory alignment with the desired nerves.  For each target described below the skin was anesthetized with 1 ml of 1% Lidocaine without epinephrine.   To block the facet joint nerve to C2, the needle was fluoroscopically positioned over the inferior lateral portion of the C2/3 facet joint nerve where the third occipital nerve (TON) lies.  To block the facet joint nerves from C3 through C7, the lateral masses of these respective levels were localized under fluoroscopic visualization.  A spinal needle was inserted down to the "waist" at the above  mentioned cervical levels.  The  needle was then "walked off" until it rested just lateral to the trough of the lateral mass of the medial branch nerve, which innervates the cervical facet joint.  To block the C8 facet joint nerve, the needle was fluoroscopically introduced onto the Tl transverse process at its most medial superior end.  After contact with periosteum and negative aspirate for  blood and CSF, correct placement without intravascular or epidural spread was confirmed by Bi-planar images and  injecting 0.5 ml. of Omnipaque-240.  A spot radiograph was obtained of this image.  Next, a 0.5 ml. volume of 1% Lidocaine without Epinephrine was then injected.  Prior to the procedure, the patient was given a Pain Diary which was completed for baseline measurements.  After the procedure, the patient rated their pain every 30 minutes and will continue rating at this frequency for a total of 5 hours.  The patient has been asked to complete the Diary and return to [place] by mail, fax or hand delivered as soon as possible.   Additional Comments:  The patient tolerated the procedure well Dressing: Band-Aid    Post-procedure details: Patient was observed during the procedure. Post-procedure instructions were reviewed.  Patient left the clinic in stable condition.       Clinical History: CLINICAL DATA: Chronic headache. Recent neck pain. Symptoms particularly left-sided.  EXAM: CERVICAL SPINE - 2-3 VIEW  COMPARISON: None.  FINDINGS: No malalignment. Mild straightening of the normal cervical lordosis. Mild disc space narrowing at C4-5 and C5-6. No advanced degenerative spondylosis. No sign of facet arthropathy. No evidence of craniocervical degenerative change.  IMPRESSION: Mild disc space narrowing at C4-5 and C5-6. No advanced finding that one would expect would be a definite cause of neck pain or cervicogenic pain syndromes.   Electronically Signed By: Nelson Chimes M.D. On: 01/01/2019 17:20 --------------  Interface, Rad Results In - 09/05/2015  8:23 AM EDT MRI BRAIN WITH AND WITHOUT CONTRAST (NEUROLOGY FLAIR PROTOCOL), 09/04/2015 6:37 PM   INDICATION:  severe headacheR51 Severe headache   COMPARISON: None.   TECHNIQUE: Multiplanar, multi-sequence MR imaging of the entire brain was performed before and after intravenous administration gadolinium-based  contrast.   FINDINGS:  Calvarium/skull base: No focal marrow replacing lesion suggestive of neoplasm. Patchy region of intrinsic T1 hyperintensity within the diploic space of the left frontal calvarium may have partial enhancement on postcontrast imaging, however the intrinsic T1 hyperintensity favors a benign incidental etiology such as a hemangioma. Orbits: Grossly unremarkable. Paranasal sinuses: Imaged portions clear. Brain:     No substantial white matter disease for patient's age. There is a single focus of T2/FLAIR hyperintensity in the left medial occipital white matter (series 7, image 9) that may represent the sequela prior ischemia, inflammation or demyelination. No evidence of acute ischemia. No mass effect, hemorrhage, or hydrocephalus. No abnormal enhancement to suggest neoplasm, abscess, or mass lesion. Grossly normal flow-related signal in the major intracranial arteries and dural sinuses. Additional comments: None.   CONCLUSION:  Unremarkable pre and postcontrast MRI of the brain.     Objective:  VS:  HT:    WT:   BMI:     BP:112/70  HR:85bpm  TEMP: ( )  RESP:  Physical Exam  Ortho Exam Imaging: No results found.

## 2019-05-22 NOTE — Procedures (Signed)
Diagnostic Cervical Facet Joint Nerve Block with Fluoroscopic Guidance  Patient: Melissa [name]      Date of Birth: May 12, 1965 MRN: [id number] PCP: Maurice Small, MD      Visit Date: 03/29/2019   Universal Protocol:    Date/Time: 05/22/2110:29 PM  Consent Given By: the patient  Position: PRONE  Additional Comments: Vital signs were monitored before and after the procedure. Patient was prepped and draped in the usual sterile fashion. The correct patient, procedure, and site was verified.   Injection Procedure Details:  Procedure Site One Meds Administered:  Meds ordered this encounter  Medications  . methylPREDNISolone acetate (DEPO-MEDROL) injection 40 mg     Laterality: Bilateral  Location/Site:  C2-3  Needle size: 25 G  Needle type: Spinal  Needle Placement: Articular Pillar  Findings:  -Contrast Used: 0.5 mL iohexol 180 mg iodine/mL   -Comments: Excellent flow of contrast through the articular pillars without intravascular flow  Procedure Details: The fluoroscope beam was positioned to square off the endplates of the desired vertebral level to achieve a true AP position. The beam was then moved in a small "counter" oblique to the contralateral side with a small amount of caudal tilt to achieve a trajectory alignment with the desired nerves.  For each target described below the skin was anesthetized with 1 ml of 1% Lidocaine without epinephrine.   To block the facet joint nerve to C2, the needle was fluoroscopically positioned over the inferior lateral portion of the C2/3 facet joint nerve where the third occipital nerve (TON) lies.  To block the facet joint nerves from C3 through C7, the lateral masses of these respective levels were localized under fluoroscopic visualization.  A spinal needle was inserted down to the "waist" at the above mentioned cervical levels.  The  needle was then "walked off" until it rested just lateral to the trough of the lateral mass of  the medial branch nerve, which innervates the cervical facet joint.  To block the C8 facet joint nerve, the needle was fluoroscopically introduced onto the Tl transverse process at its most medial superior end.  After contact with periosteum and negative aspirate for blood and CSF, correct placement without intravascular or epidural spread was confirmed by Bi-planar images and  injecting 0.5 ml. of Omnipaque-240.  A spot radiograph was obtained of this image.  Next, a 0.5 ml. volume of 1% Lidocaine without Epinephrine was then injected.  Prior to the procedure, the patient was given a Pain Diary which was completed for baseline measurements.  After the procedure, the patient rated their pain every 30 minutes and will continue rating at this frequency for a total of 5 hours.  The patient has been asked to complete the Diary and return to [place] by mail, fax or hand delivered as soon as possible.   Additional Comments:  The patient tolerated the procedure well Dressing: Band-Aid    Post-procedure details: Patient was observed during the procedure. Post-procedure instructions were reviewed.  Patient left the clinic in stable condition.

## 2019-05-30 ENCOUNTER — Encounter: Payer: Self-pay | Admitting: Physical Medicine and Rehabilitation

## 2019-06-02 ENCOUNTER — Other Ambulatory Visit: Payer: Self-pay | Admitting: Neurology

## 2019-06-02 DIAGNOSIS — R519 Headache, unspecified: Secondary | ICD-10-CM

## 2019-06-02 DIAGNOSIS — G8929 Other chronic pain: Secondary | ICD-10-CM

## 2019-06-07 ENCOUNTER — Encounter: Payer: Self-pay | Admitting: Physical Medicine and Rehabilitation

## 2019-06-11 ENCOUNTER — Telehealth: Payer: Self-pay | Admitting: *Deleted

## 2019-06-11 ENCOUNTER — Telehealth: Payer: Self-pay | Admitting: Physical Medicine and Rehabilitation

## 2019-06-11 NOTE — Telephone Encounter (Signed)
[id number] Destruction by neurolytic agent, paraver more  Notification/Prior Authorization not required if procedure performed in Office; otherwise may be required for this service.

## 2019-06-15 ENCOUNTER — Ambulatory Visit: Payer: 59 | Attending: Internal Medicine

## 2019-06-15 DIAGNOSIS — Z23 Encounter for immunization: Secondary | ICD-10-CM

## 2019-06-15 NOTE — Progress Notes (Signed)
   Covid-19 Vaccination Clinic  Name:  Melissa [name]    MRN: [id number] DOB: October 20, 1965  06/15/2019  [name] was observed post Covid-19 immunization for 15 minutes without incident. She was provided with Vaccine Information Sheet and instruction to access the V-Safe system.   [name] was instructed to call 911 with any severe reactions post vaccine: Marland Kitchen Difficulty breathing  . Swelling of face and throat  . A fast heartbeat  . A bad rash all over body  . Dizziness and weakness   Immunizations Administered    Name Date Dose VIS Date Route   Pfizer COVID-19 Vaccine 06/15/2019  1:25 PM 0.3 mL 03/02/2019 Intramuscular   Manufacturer: [place]   Lot: [id number]   [place]: [id number]

## 2019-06-20 ENCOUNTER — Other Ambulatory Visit: Payer: Self-pay | Admitting: Cardiovascular Disease

## 2019-06-20 ENCOUNTER — Other Ambulatory Visit: Payer: Self-pay | Admitting: Physician Assistant

## 2019-06-20 DIAGNOSIS — I480 Paroxysmal atrial fibrillation: Secondary | ICD-10-CM

## 2019-06-27 ENCOUNTER — Ambulatory Visit: Payer: 59 | Admitting: Neurology

## 2019-07-02 ENCOUNTER — Ambulatory Visit: Payer: 59 | Admitting: Neurology

## 2019-07-10 ENCOUNTER — Ambulatory Visit: Payer: 59 | Attending: Internal Medicine

## 2019-07-10 DIAGNOSIS — Z23 Encounter for immunization: Secondary | ICD-10-CM

## 2019-07-10 IMAGING — CR DG CHEST 2V
2 series · 2 of 2 positions shown · non-contrast
Comparison: 06/10/2015

CLINICAL DATA: Mass in the atrium

EXAM:
CHEST  2 VIEW

[chest pa]
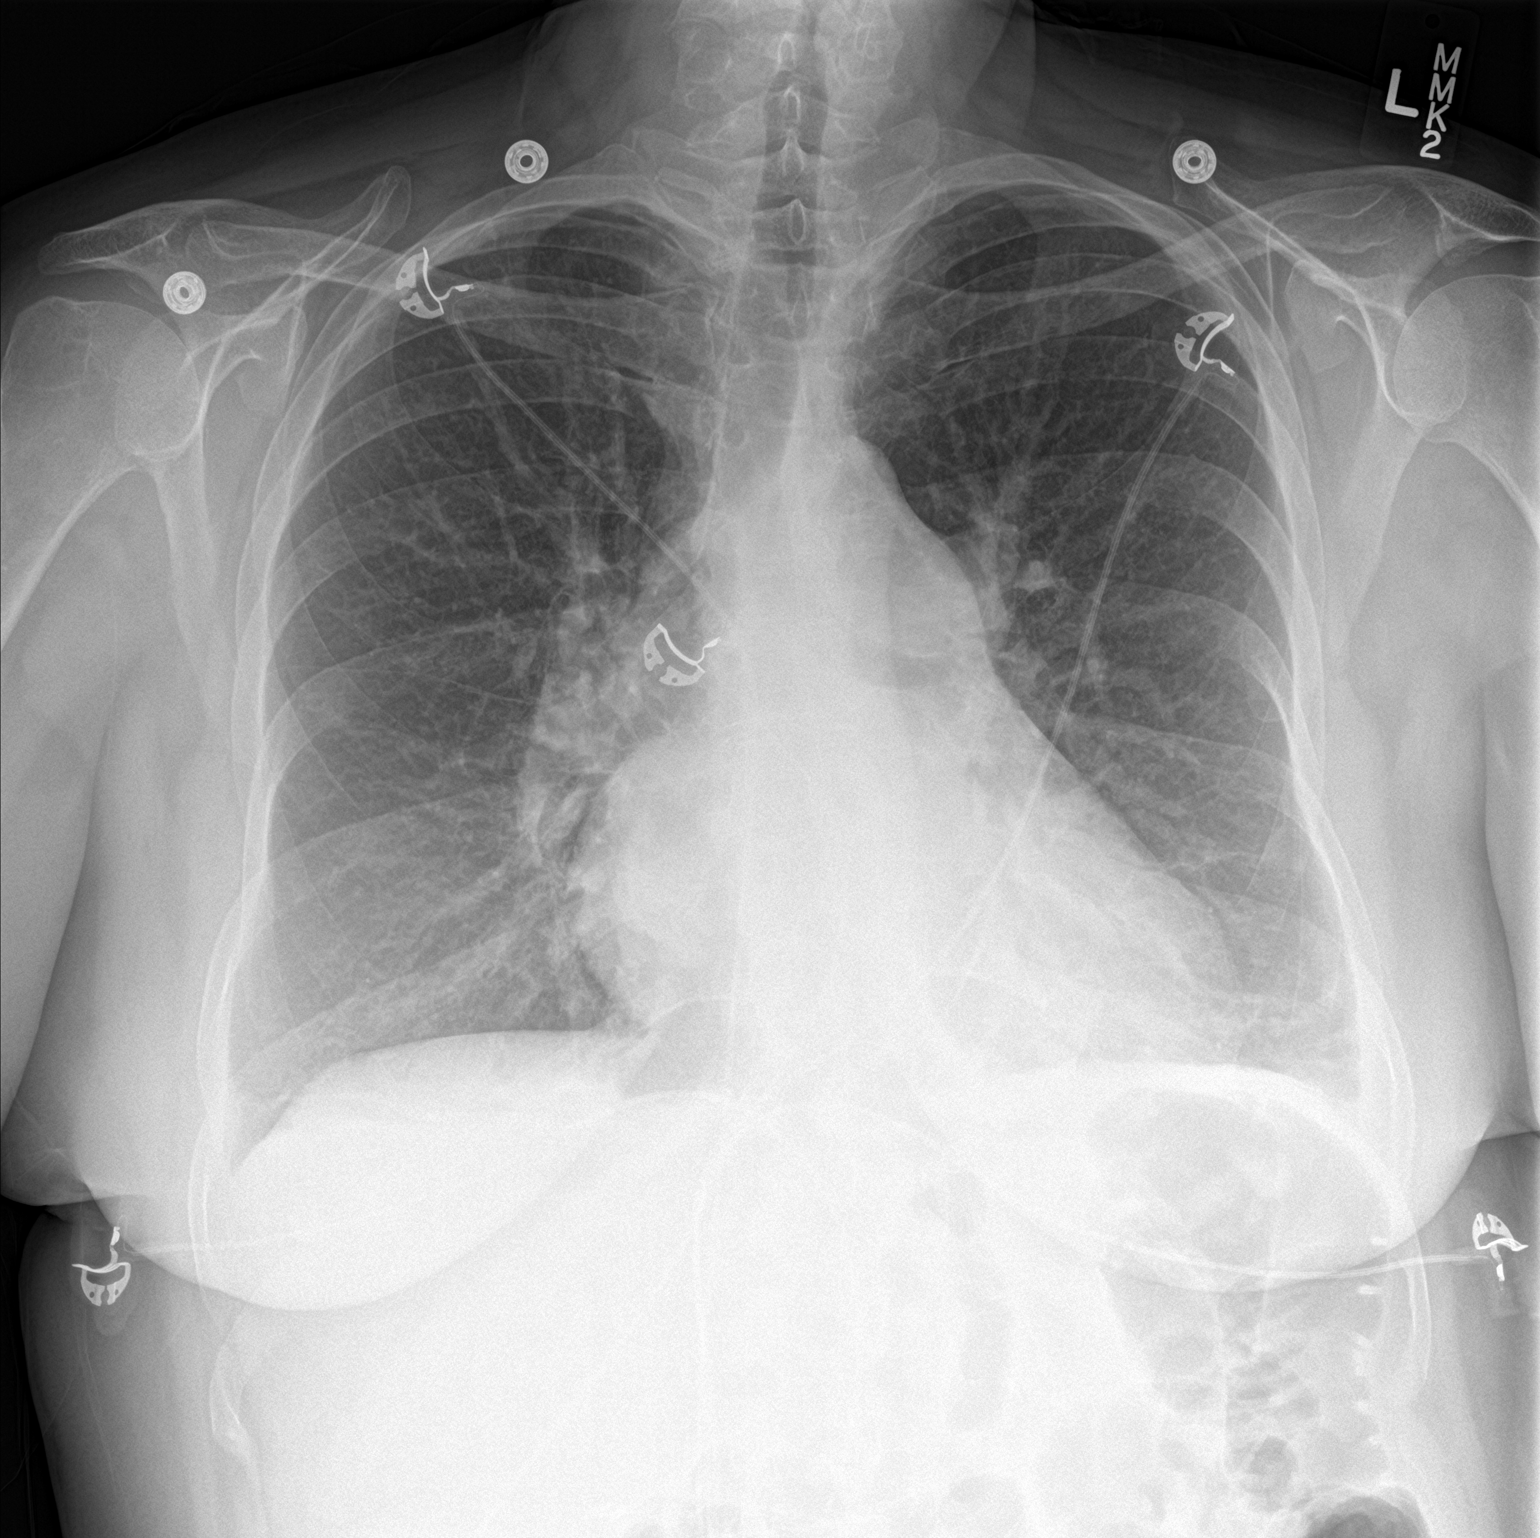

[chest lat]
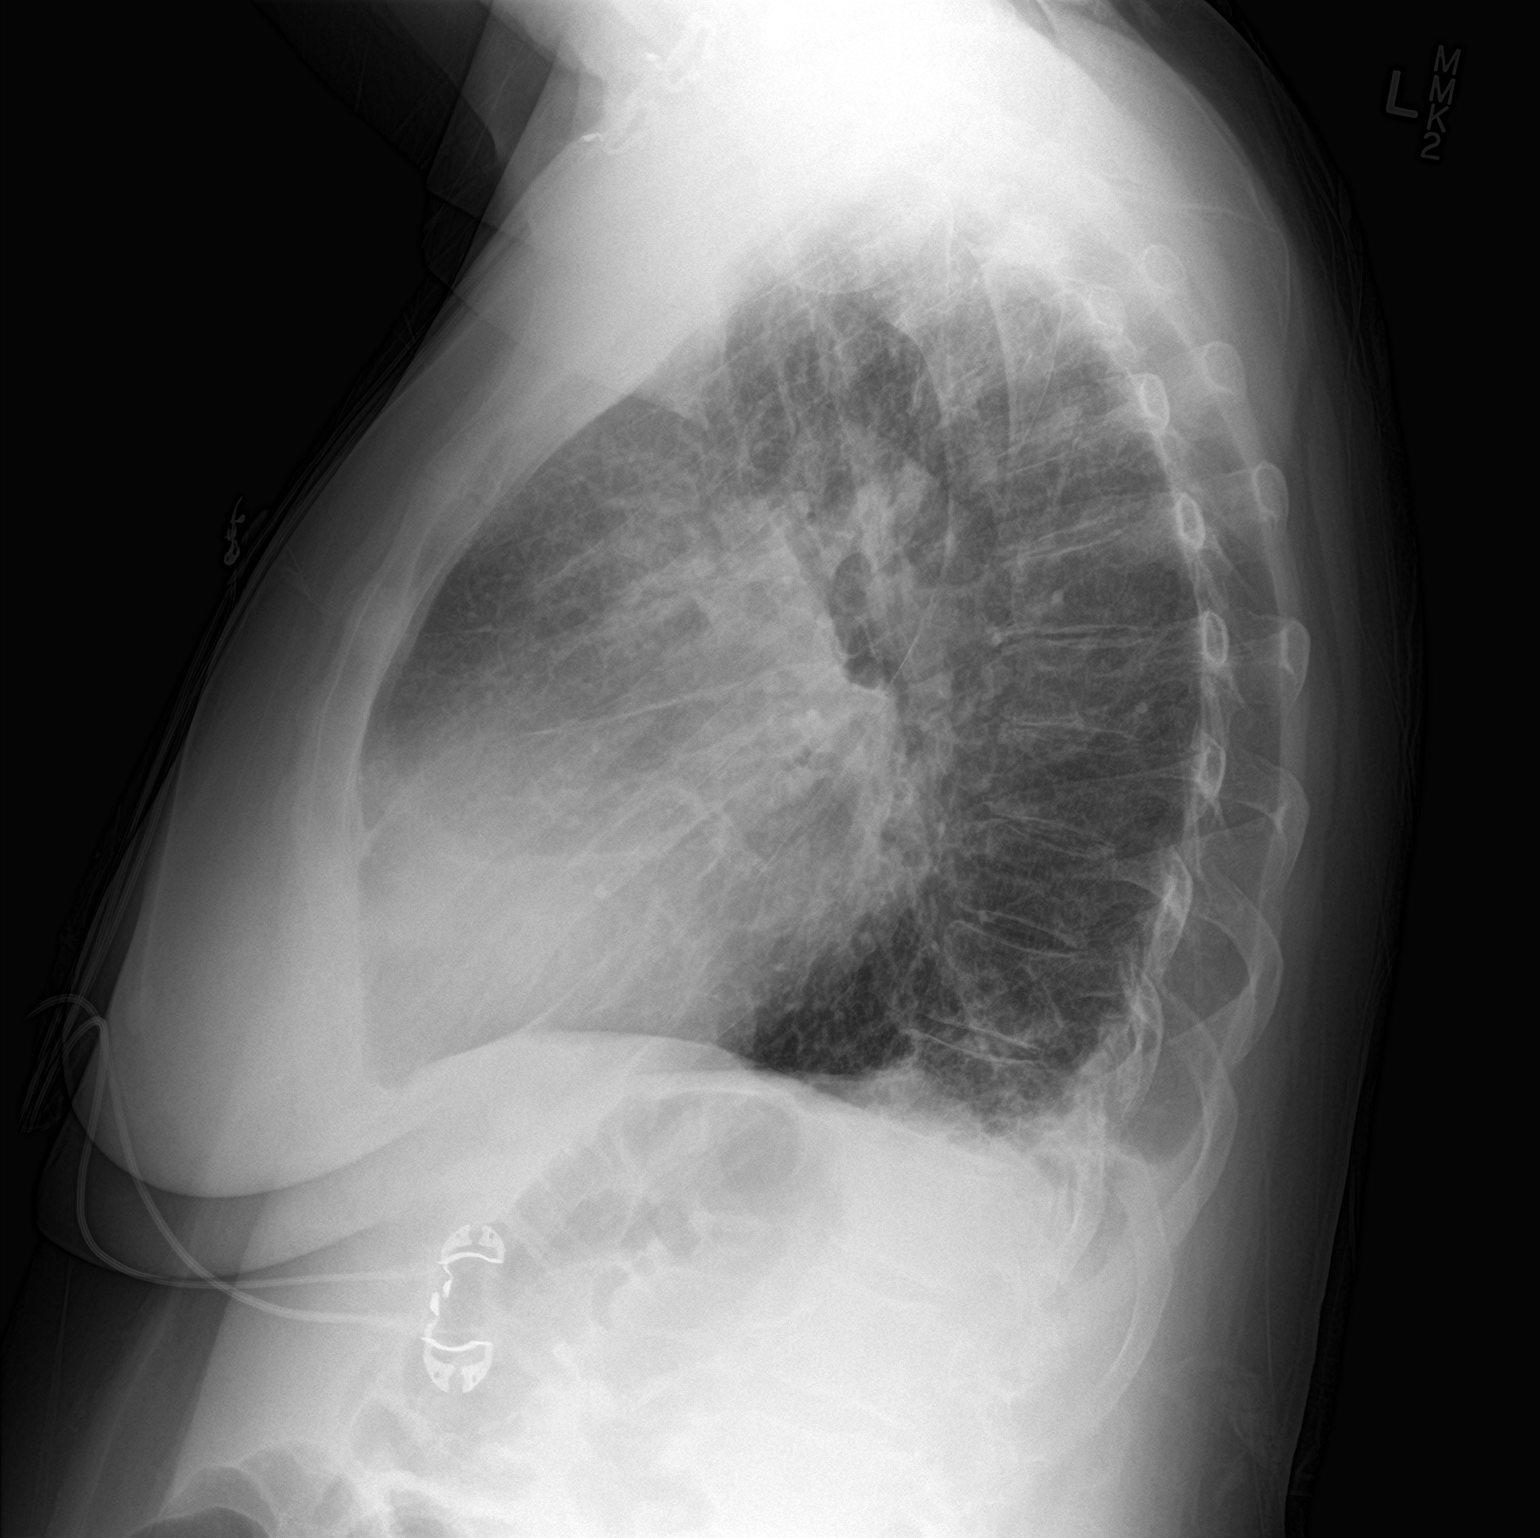

[2 of 2 positions shown; findings below may reference images not displayed]

FINDINGS: Small bilateral effusions. No focal consolidation. Mild
cardiomegaly. Increased cardiac opacity with slight splaying of the
carina. No pneumothorax
IMPRESSION: 1. Small bilateral effusions
2. Mild cardiomegaly with left atrial enlargement.

## 2019-07-10 NOTE — Progress Notes (Signed)
   Covid-19 Vaccination Clinic  Name:  Melissa [name]    MRN: [id number] DOB: December 01, 1965  07/10/2019  Melissa [name] was observed post Covid-19 immunization for 15 minutes without incident. She was provided with Vaccine Information Sheet and instruction to access the V-Safe system.   Melissa [name] was instructed to call 911 with any severe reactions post vaccine: Marland Kitchen Difficulty breathing  . Swelling of face and throat  . A fast heartbeat  . A bad rash all over body  . Dizziness and weakness   Immunizations Administered    Name Date Dose VIS Date Route   Pfizer COVID-19 Vaccine 07/10/2019  8:55 AM 0.3 mL 05/16/2018 Intramuscular   Manufacturer: [place]   Lot: [id number]   [place]: [id number]

## 2019-07-16 ENCOUNTER — Encounter: Payer: Self-pay | Admitting: Physical Medicine and Rehabilitation

## 2019-07-16 ENCOUNTER — Ambulatory Visit: Payer: 59 | Admitting: Physical Medicine and Rehabilitation

## 2019-07-16 ENCOUNTER — Other Ambulatory Visit: Payer: Self-pay

## 2019-07-16 ENCOUNTER — Ambulatory Visit: Payer: Self-pay

## 2019-07-16 VITALS — BP 148/94 | HR 73

## 2019-07-16 DIAGNOSIS — G4486 Cervicogenic headache: Secondary | ICD-10-CM

## 2019-07-16 DIAGNOSIS — M47812 Spondylosis without myelopathy or radiculopathy, cervical region: Secondary | ICD-10-CM | POA: Diagnosis not present

## 2019-07-16 MED ORDER — METHYLPREDNISOLONE ACETATE 80 MG/ML IJ SUSP: 40.0000 mg | Freq: Once | INTRAMUSCULAR | Status: DC

## 2019-07-16 NOTE — Progress Notes (Signed)
 .  Numeric Pain Rating Scale and Functional Assessment Average Pain 6   In the last MONTH (on 0-10 scale) has pain interfered with the following?  1. General activity like being  able to carry out your everyday physical activities such as walking, climbing stairs, carrying groceries, or moving a chair?  Rating(6)   +Driver, +BT(Xarelto, ok for inj), -Dye Allergies.

## 2019-07-17 NOTE — Progress Notes (Signed)
Melissa [name] - 54 y.o. female MRN [id number]  Date of birth: 27-Dec-1965  Office Visit Note: Visit Date: 07/16/2019 PCP: Maurice Small, MD Referred by: Maurice Small, MD  Subjective: Chief Complaint  Patient presents with  . Neck - Pain  . Head - Pain   HPI: Melissa [name] is a 54 y.o. female who comes in today For planned bilateral C2-3 facet joint denervation or radiofrequency ablation.  This would be the ablation of the C2 and C3 medial branches as well as a local third occipital nerve branch.  Patient is having upper cervical pain worse with rotation of the cervical spine and a history of atypical migraine headache which has been intractable.  Please see our notes for further details and justification including double diagnostic medial branch blocks as well as notes of physical therapy and medication management.  She has been followed by neurology for her neck and headache pain as well.  She has no new symptoms today.  ROS Otherwise per HPI.  Assessment & Plan: Visit Diagnoses:  1. Cervical spondylosis without myelopathy   2. Cervicogenic headache     Plan: No additional findings.   Meds & Orders:  Meds ordered this encounter  Medications  . methylPREDNISolone acetate (DEPO-MEDROL) injection 40 mg    Orders Placed This Encounter  Procedures  . Radiofrequency,Lumbar  . XR C-ARM NO REPORT    Follow-up: No follow-ups on file.   Procedures: No procedures performed  Cervical Facet Nerve Denervation  Patient: Melissa [name]      Date of Birth: 1966/01/29 MRN: [id number] PCP: Maurice Small, MD      Visit Date: 07/16/2019   Universal Protocol:    Date/Time: 04/27/216:16 AM  Consent Given By: the patient  Position: PRONE  Additional Comments: Vital signs were monitored before and after the procedure. Patient was prepped and draped in the usual sterile fashion. The correct patient, procedure, and site was verified.   Injection Procedure Details:  Procedure Site  One Meds Administered:  Meds ordered this encounter  Medications  . methylPREDNISolone acetate (DEPO-MEDROL) injection 40 mg     Laterality: Bilateral  Location/Site:  C2-3  Needle size: 18 G  Needle type: RF cannula, 5 mm active tip  Findings:  -Comments:   Procedure Details: The fluoroscope beam was positioned to square off the endplates of the desired vertebral level to achieve a true AP position. The beam was then moved in a small "counter" oblique to the contralateral side with a small amount of caudal tilt to achieve a trajectory alignment with the desired nerves.  For each target described below the skin was anesthetized with 1 ml of 1% Lidocaine without epinephrine.  To denervate the facet joint nerve to C2 (third occipital nerve), the cannula was advanced under fluoroscopic guidance and positioned over the inferior lateral portion of the C2/3 facet joint nerve where the third occipital nerve lies.  A minimum of three lesions were made along the location of the nerve.   For all of these levels, AP and lateral images were used to confirm location.  The radiofrequency probe was inserted into the cannula and stimulation was carried out at both sensory and motor levels to make sure there was expected stimulation without a radicular pattern. Subsequently, this was removed and then 0.5 to 1 ml. of 1% Lidocaine was injected. The radiofrequency probe was re-inserted and denervation of the facet nerves (medial branches of the dorsal rami innervating the facet joints) was carried out at  80-85 degrees Celsius for 60 seconds. Then the cannulas were repositioned to get one additional lesion at each level (total of two) for better efficacy. The above procedure was repeated for each facet joint nerve mentioned above. Radiographs were obtained at each level (unless otherwise noted) to verify probe placement during the neurotomy.  Additional Comments:  The patient tolerated the procedure  well Dressing: Band-Aid    Post-procedure details: Patient was observed during the procedure. Post-procedure instructions were reviewed. Patient left the clinic in stable condition.       Clinical History: CLINICAL DATA: Chronic headache. Recent neck pain. Symptoms particularly left-sided.  EXAM: CERVICAL SPINE - 2-3 VIEW  COMPARISON: None.  FINDINGS: No malalignment. Mild straightening of the normal cervical lordosis. Mild disc space narrowing at C4-5 and C5-6. No advanced degenerative spondylosis. No sign of facet arthropathy. No evidence of craniocervical degenerative change.  IMPRESSION: Mild disc space narrowing at C4-5 and C5-6. No advanced finding that one would expect would be a definite cause of neck pain or cervicogenic pain syndromes.   Electronically Signed By: Nelson Chimes M.D. On: 01/01/2019 17:20 --------------  Interface, Rad Results In - 09/05/2015  8:23 AM EDT MRI BRAIN WITH AND WITHOUT CONTRAST (NEUROLOGY FLAIR PROTOCOL), 09/04/2015 6:37 PM   INDICATION:  severe headacheR51 Severe headache   COMPARISON: None.   TECHNIQUE: Multiplanar, multi-sequence MR imaging of the entire brain was performed before and after intravenous administration gadolinium-based contrast.   FINDINGS:  Calvarium/skull base: No focal marrow replacing lesion suggestive of neoplasm. Patchy region of intrinsic T1 hyperintensity within the diploic space of the left frontal calvarium may have partial enhancement on postcontrast imaging, however the intrinsic T1 hyperintensity favors a benign incidental etiology such as a hemangioma. Orbits: Grossly unremarkable. Paranasal sinuses: Imaged portions clear. Brain:     No substantial white matter disease for patient's age. There is a single focus of T2/FLAIR hyperintensity in the left medial occipital white matter (series 7, image 9) that may represent the sequela prior ischemia, inflammation or demyelination. No evidence of  acute ischemia. No mass effect, hemorrhage, or hydrocephalus. No abnormal enhancement to suggest neoplasm, abscess, or mass lesion. Grossly normal flow-related signal in the major intracranial arteries and dural sinuses. Additional comments: None.   CONCLUSION:  Unremarkable pre and postcontrast MRI of the brain.   She reports that she quit smoking about 24 years ago. Her smoking use included cigarettes. She has a 2.50 pack-year smoking history. She has never used smokeless tobacco. No results for input(s): HGBA1C, LABURIC in the last 8760 hours.  Objective:  VS:  HT:    WT:   BMI:     BP:(!) 148/94  HR:73bpm  TEMP: ( )  RESP:  Physical Exam Musculoskeletal:     Cervical back: Neck supple. Tenderness present. No rigidity.  Lymphadenopathy:     Cervical: No cervical adenopathy.  Neurological:     General: No focal deficit present.     Mental Status: She is oriented to person, place, and time.     Cranial Nerves: No cranial nerve deficit.     Sensory: No sensory deficit.     Motor: No weakness.     Ortho Exam  Imaging: XR C-ARM NO REPORT  Result Date: 07/16/2019 Please see Notes tab for imaging impression.   Past Medical/Family/Surgical/Social History: Medications & Allergies reviewed per EMR, new medications updated. Patient Active Problem List   Diagnosis Date Noted  . Chronic intractable headache 04/03/2019  . Genetic testing 02/01/2018  . Family history  of breast cancer   . Family history of thyroid cancer   . Family history of uterine cancer   . Gastroesophageal reflux disease with esophagitis 09/07/2017  . Iron deficiency anemia 07/05/2017  . Persistent atrial fibrillation ([place]) 02/22/2017  . Chronic diastolic heart failure ([place]) 02/22/2017  . Migraine   . Depression   . Arthritis   . Allergy   . ADD (attention deficit disorder)   . Paroxysmal atrial fibrillation ([place]) 01/31/2017  . Pulmonary hypertension, unspecified ([place]) 01/14/2017  . History of atrial  myxoma   . Atrial mass 12/28/2016  . Acute diastolic heart failure ([place])   . Migraines 12/24/2016  . Left atrial mass 12/24/2016  . (HFpEF) heart failure with preserved ejection fraction ([place]) 12/24/2016  . Asthma without status asthmaticus 12/20/2016  . 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/2016  . Weight gain 10/21/2016  . BMI 38.0-38.9,adult 09/24/2016  . Dyspnea on exertion 09/24/2016  . Intractable persistent migraine aura without cerebral infarction and without status migrainosus 09/24/2016  . Stress-related physiological response affecting medical condition 09/24/2016  . Fibromyalgia 08/28/2015  . Obesity (BMI 30-39.9) 08/28/2015  . Status migrainosus 07/11/2015  . Vitamin D deficiency 03/27/2015  . Migraine without aura, intractable, without status migrainosus 03/13/2015  . H/O gastric bypass 10/16/2013  . Heartburn 10/16/2013  . Osteoporosis 10/16/2013  . Hair loss 08/28/2012  . Disordered sleep 08/28/2012  . S/P gastric bypass 06/21/2012  . Insomnia 06/21/2012  . Postoperative anemia 01/12/2012  . MENOPAUSE-RELATED VASOMOTOR SYMPTOMS, HOT FLASHES 12/07/2007  . RLS (restless legs syndrome) 10/26/2007  . Allergic rhinitis 12/23/2006  . HEADACHE 12/23/2006  . ALLERGIC RHINITIS, SEASONAL 11/04/2006  . SYMPTOM, DYSFUNCTION, SLEEP STAGE 11/04/2006   Past Medical History:  Diagnosis Date  . (HFpEF) heart failure with preserved ejection fraction ([place])   . ADD (attention deficit disorder)   . Allergy   . Arthritis   . Asthma   . 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 Depression   . Family history of breast cancer   . Family history of thyroid cancer   . Family history of uterine cancer   . Migraine   . Osteoporosis   . Pulmonary hypertension ([place])   . RLS (restless legs syndrome)    Family History  Problem Relation Age of Onset  . Thyroid cancer Mother   . Endometrial cancer Sister 47  . Breast cancer Maternal Aunt        dx > 50  . Breast cancer Maternal Grandmother        dx >50  .  Breast cancer Maternal Aunt        dx  >50  . Depression Other        FMHx  . Stroke Paternal Grandfather   . Migraines Neg Hx    Past Surgical History:  Procedure Laterality Date  . ABDOMINAL HYSTERECTOMY    . CARDIOVERSION N/A 01/10/2017   Procedure: CARDIOVERSION;  Surgeon: Dorothy Spark, MD;  Location: [facility] ENDOSCOPY;  Service: Cardiovascular;  Laterality: N/A;  . CHOLECYSTECTOMY    . EXCISION OF ATRIAL MYXOMA Left 12/28/2016   Procedure: EXCISION OF LEFT ATRIAL MYXOMA;  Surgeon: Ivin Poot, MD;  Location: [facility];  Service: Open Heart Surgery;  Laterality: Left;  . FOOT SURGERY    . GASTRIC BYPASS    . INTRAOPERATIVE TRANSESOPHAGEAL ECHOCARDIOGRAM N/A 12/28/2016   Procedure: INTRAOPERATIVE TRANSESOPHAGEAL ECHOCARDIOGRAM;  Surgeon: Ivin Poot, MD;  Location: [facility];  Service: Open Heart Surgery;  Laterality: N/A;  . TEE WITHOUT CARDIOVERSION N/A  01/10/2017   Procedure: TRANSESOPHAGEAL ECHOCARDIOGRAM (TEE);  Surgeon: Dorothy Spark, MD;  Location: [facility] ENDOSCOPY;  Service: Cardiovascular;  Laterality: N/A;  . TEE WITHOUT CARDIOVERSION N/A 05/17/2017   Procedure: TRANSESOPHAGEAL ECHOCARDIOGRAM (TEE);  Surgeon: Josue Hector, MD;  Location[facility] ENDOSCOPY;  Service: Cardiovascular;  Laterality: N/A;  . TOTAL KNEE ARTHROPLASTY    . TUBAL LIGATION     Social History   Occupational History  . Not on file  Tobacco Use  . Smoking status: Former Smoker    Packs/day: 0.25    Years: 10.00    Pack years: 2.50    Types: Cigarettes    Quit date: 1997    Years since quitting: 24.3  . Smokeless tobacco: Never Used  Substance and Sexual Activity  . Alcohol use: Yes    Alcohol/week: 3.0 - 5.0 standard drinks    Types: 3 - 5 Glasses of wine per week  . Drug use: No  . Sexual activity: Not on file

## 2019-07-17 NOTE — Procedures (Signed)
Cervical Facet Nerve Denervation  Patient: Melissa [name]      Date of Birth: Dec 06, 1965 MRN: [id number] PCP: Maurice Small, MD      Visit Date: 07/16/2019   Universal Protocol:    Date/Time: 04/27/216:16 AM  Consent Given By: the patient  Position: PRONE  Additional Comments: Vital signs were monitored before and after the procedure. Patient was prepped and draped in the usual sterile fashion. The correct patient, procedure, and site was verified.   Injection Procedure Details:  Procedure Site One Meds Administered:  Meds ordered this encounter  Medications  . methylPREDNISolone acetate (DEPO-MEDROL) injection 40 mg     Laterality: Bilateral  Location/Site:  C2-3  Needle size: 18 G  Needle type: RF cannula, 5 mm active tip  Findings:  -Comments:   Procedure Details: The fluoroscope beam was positioned to square off the endplates of the desired vertebral level to achieve a true AP position. The beam was then moved in a small "counter" oblique to the contralateral side with a small amount of caudal tilt to achieve a trajectory alignment with the desired nerves.  For each target described below the skin was anesthetized with 1 ml of 1% Lidocaine without epinephrine.  To denervate the facet joint nerve to C2 (third occipital nerve), the cannula was advanced under fluoroscopic guidance and positioned over the inferior lateral portion of the C2/3 facet joint nerve where the third occipital nerve lies.  A minimum of three lesions were made along the location of the nerve.   For all of these levels, AP and lateral images were used to confirm location.  The radiofrequency probe was inserted into the cannula and stimulation was carried out at both sensory and motor levels to make sure there was expected stimulation without a radicular pattern. Subsequently, this was removed and then 0.5 to 1 ml. of 1% Lidocaine was injected. The radiofrequency probe was re-inserted and denervation  of the facet nerves (medial branches of the dorsal rami innervating the facet joints) was carried out at 80-85 degrees Celsius for 60 seconds. Then the cannulas were repositioned to get one additional lesion at each level (total of two) for better efficacy. The above procedure was repeated for each facet joint nerve mentioned above. Radiographs were obtained at each level (unless otherwise noted) to verify probe placement during the neurotomy.  Additional Comments:  The patient tolerated the procedure well Dressing: Band-Aid    Post-procedure details: Patient was observed during the procedure. Post-procedure instructions were reviewed. Patient left the clinic in stable condition.

## 2019-07-24 ENCOUNTER — Telehealth: Payer: Self-pay | Admitting: Physical Medicine and Rehabilitation

## 2019-07-24 ENCOUNTER — Encounter: Payer: 59 | Admitting: Physical Medicine and Rehabilitation

## 2019-07-24 NOTE — Telephone Encounter (Signed)
Ice, gentle ROM, watchful waiting.   If extreme pain with tingling numbness down the arm with focal weakness then ED.

## 2019-07-24 NOTE — Telephone Encounter (Signed)
Patient states that the upper right side of her neck and the area at the base of her skull on the right feels very tender and similar to a bruise. She feels like this is higher that where her cervical RFA on 4/26 was. Please advise.

## 2019-07-25 ENCOUNTER — Encounter: Payer: Self-pay | Admitting: Physical Medicine and Rehabilitation

## 2019-07-25 NOTE — Telephone Encounter (Signed)
Reply sent via Mychart.

## 2019-08-08 NOTE — Telephone Encounter (Signed)
Done

## 2019-08-16 ENCOUNTER — Inpatient Hospital Stay: Payer: 59

## 2019-08-24 IMAGING — CR DG CHEST 2V
2 series · 2 of 2 positions shown · non-contrast
Comparison: 01/08/2017.

CLINICAL DATA: Atrial neck stomach position.

EXAM:
CHEST  2 VIEW

[w chest pa]
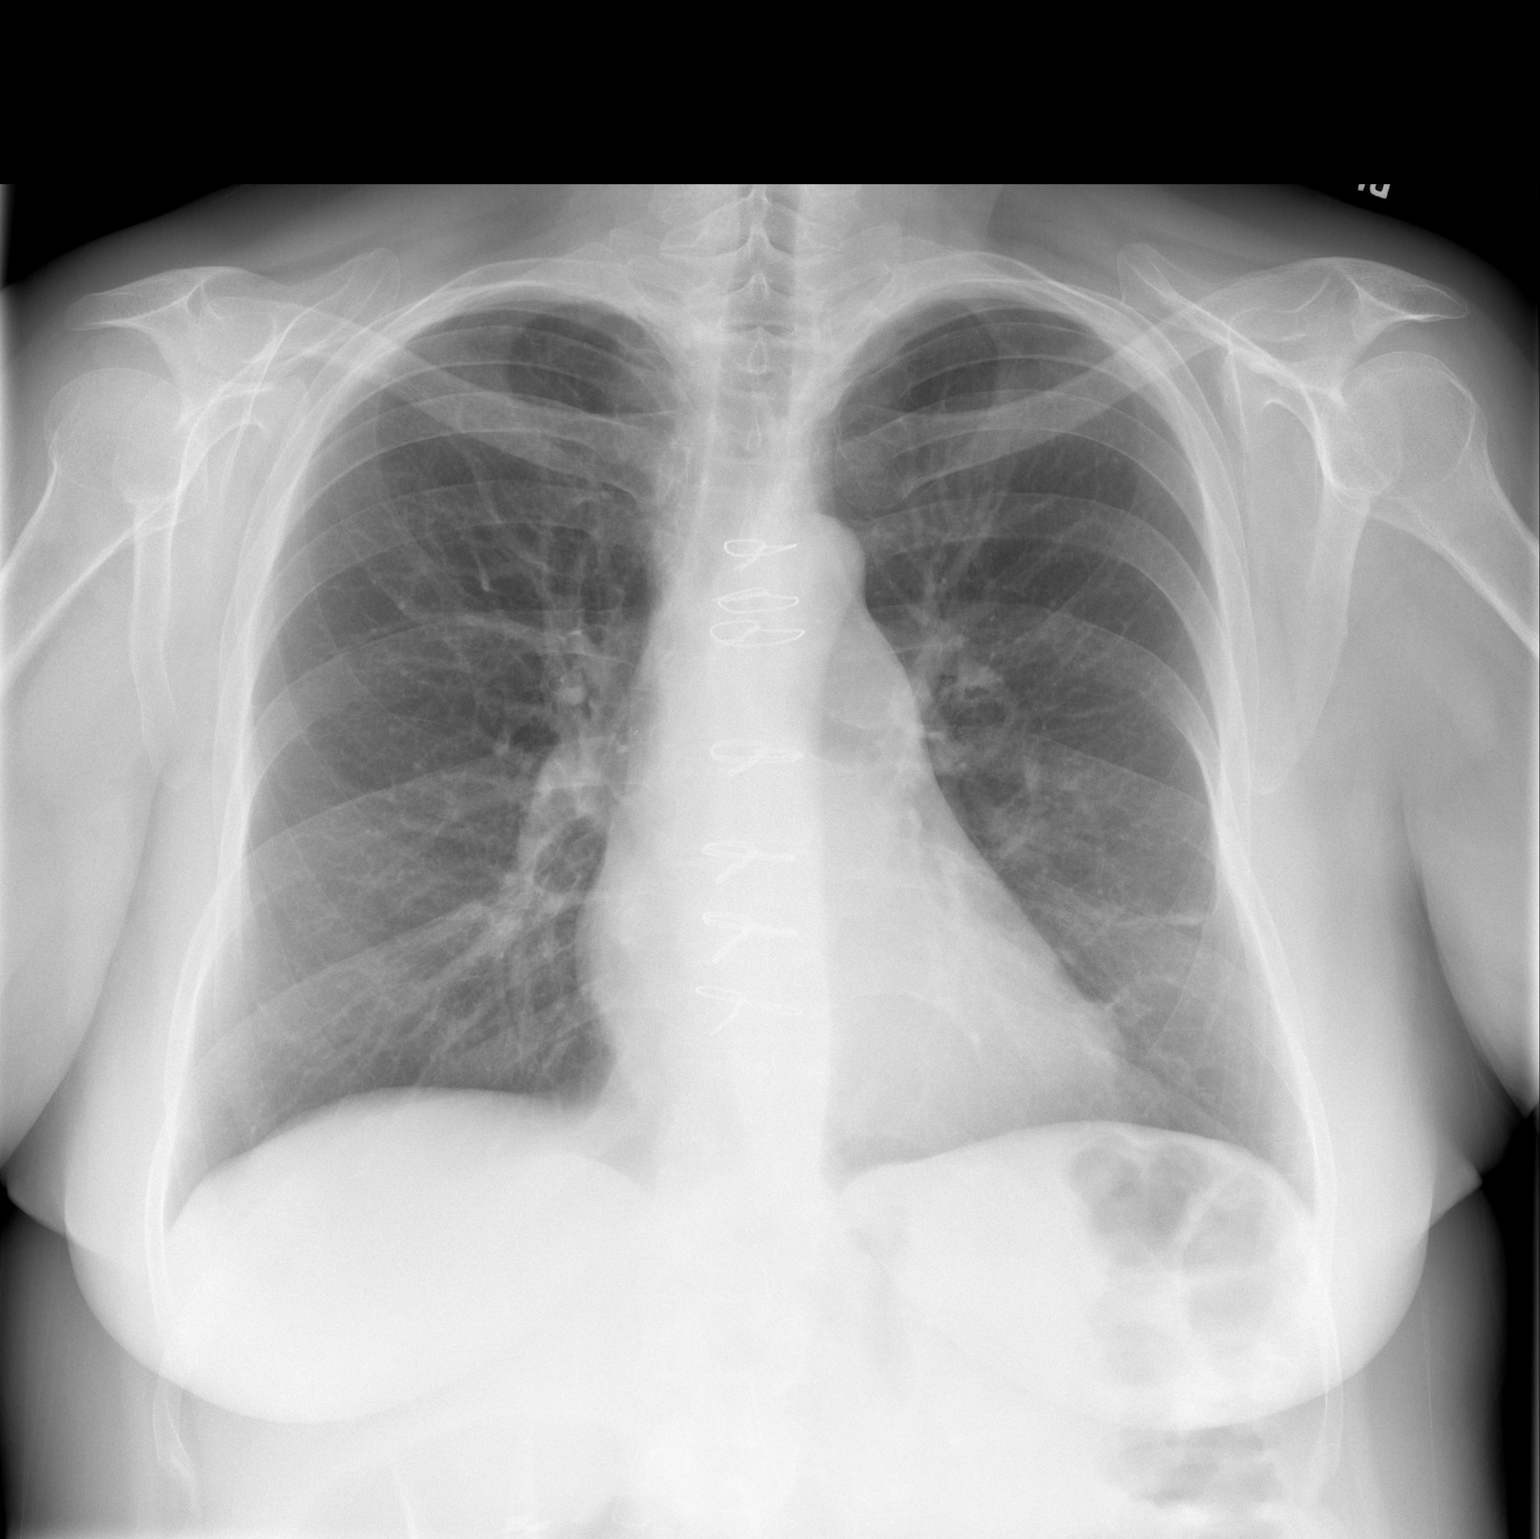

[w chest lat]
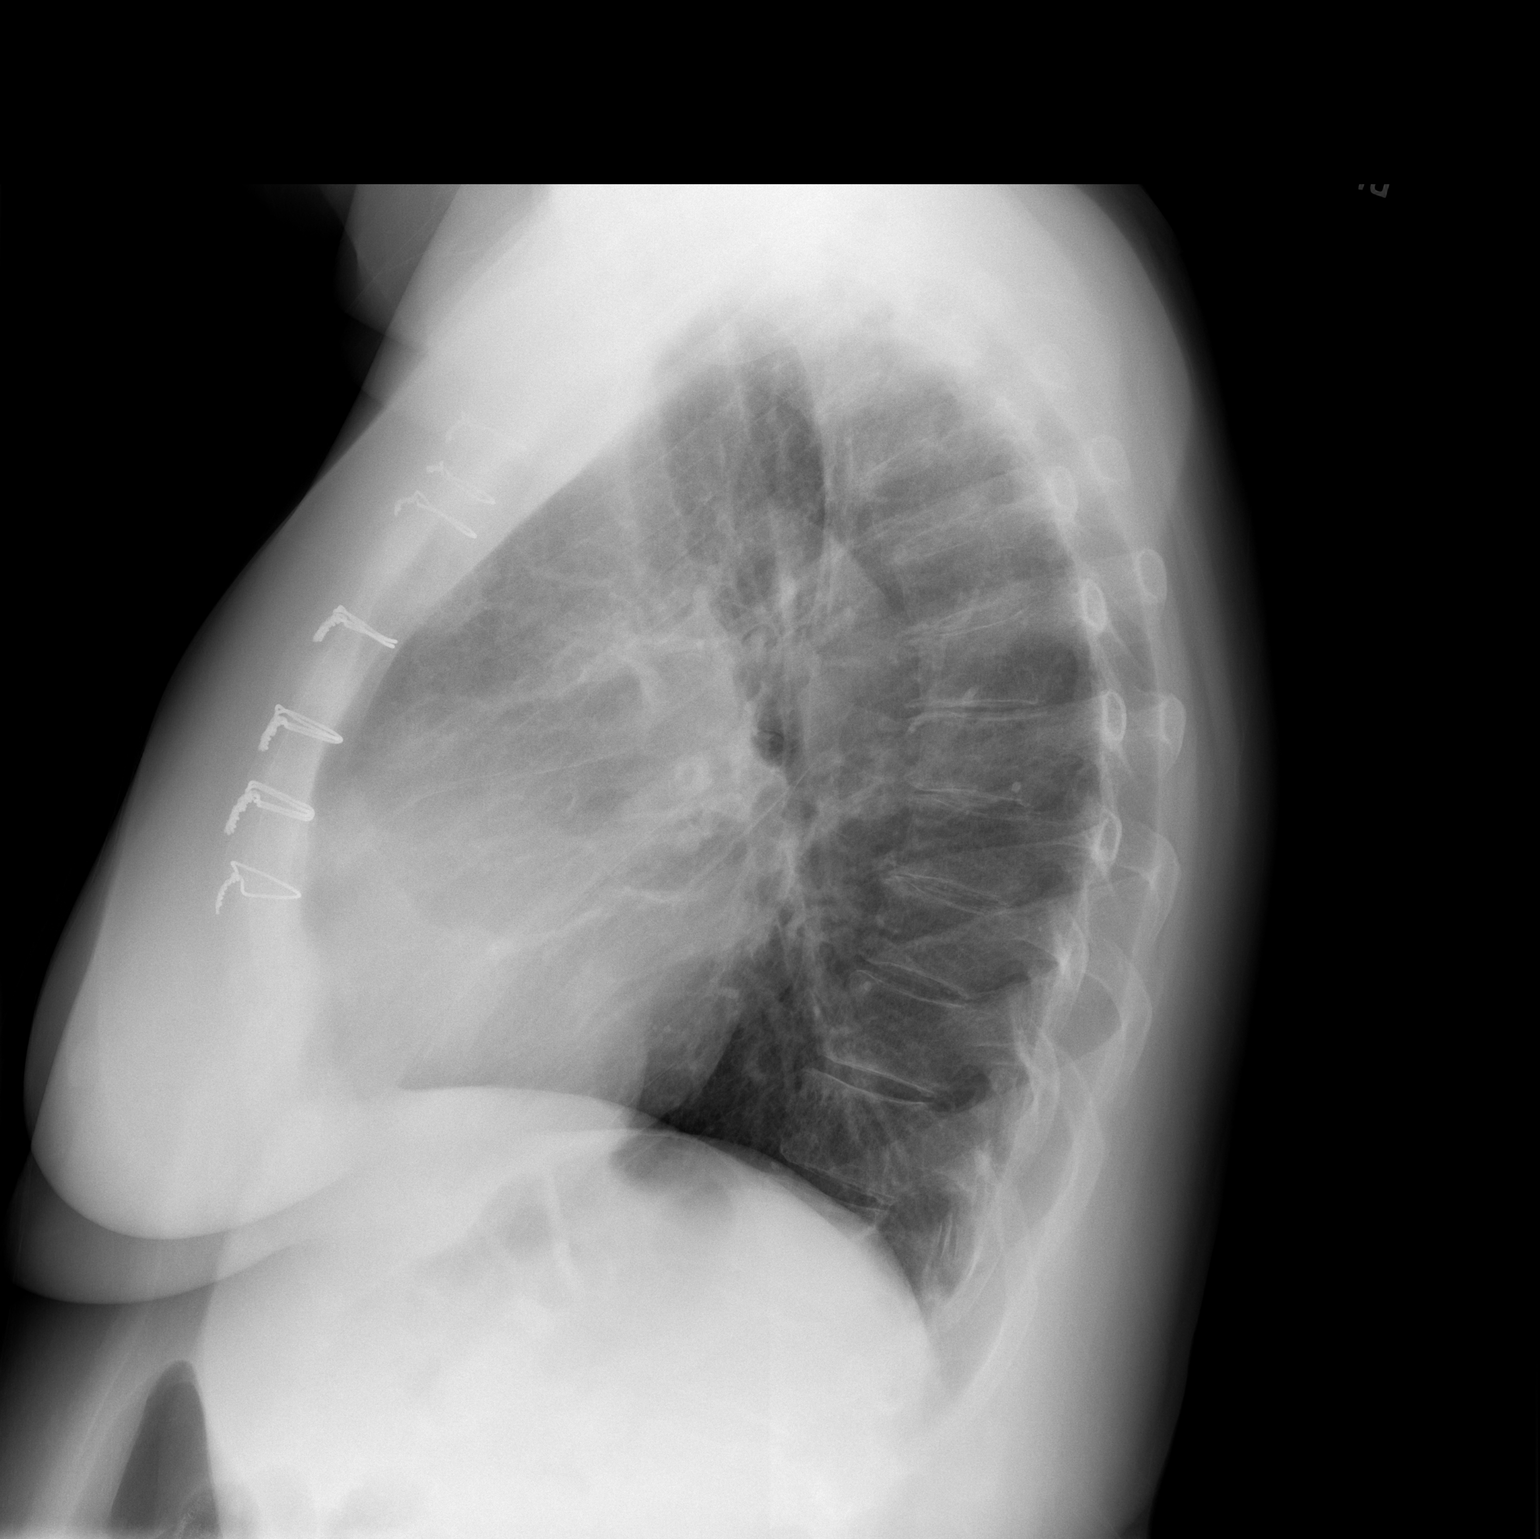

[2 of 2 positions shown; findings below may reference images not displayed]

FINDINGS: Trachea is midline. Heart size normal. There may be minimal scarring
in the lingula. Lungs are otherwise clear. No pleural fluid.
IMPRESSION: No acute findings.

## 2019-08-30 ENCOUNTER — Inpatient Hospital Stay: Payer: 59

## 2019-09-28 ENCOUNTER — Other Ambulatory Visit: Payer: Self-pay | Admitting: Family Medicine

## 2019-09-28 DIAGNOSIS — R102 Pelvic and perineal pain: Secondary | ICD-10-CM

## 2019-10-11 ENCOUNTER — Ambulatory Visit
Admission: RE | Admit: 2019-10-11 | Discharge: 2019-10-11 | Disposition: A | Discharge status: Home / Self Care | Payer: 59 | Source: Ambulatory Visit | Attending: Family Medicine | Admitting: Family Medicine

## 2019-10-11 DIAGNOSIS — R102 Pelvic and perineal pain: Secondary | ICD-10-CM

## 2019-10-12 ENCOUNTER — Telehealth: Payer: Self-pay | Admitting: Nurse Practitioner

## 2019-10-12 NOTE — Telephone Encounter (Signed)
Rescheduled appointments per 7/22 provider message. Left message on patient voicemail with updated appointment date and time.  

## 2019-12-25 ENCOUNTER — Other Ambulatory Visit: Payer: Self-pay | Admitting: Neurology

## 2019-12-25 DIAGNOSIS — M542 Cervicalgia: Secondary | ICD-10-CM

## 2020-01-13 ENCOUNTER — Ambulatory Visit
Admission: RE | Admit: 2020-01-13 | Discharge: 2020-01-13 | Disposition: A | Discharge status: Home / Self Care | Payer: 59 | Source: Ambulatory Visit | Attending: Neurology | Admitting: Neurology

## 2020-01-13 ENCOUNTER — Other Ambulatory Visit: Payer: Self-pay

## 2020-01-13 DIAGNOSIS — M542 Cervicalgia: Secondary | ICD-10-CM

## 2020-02-06 ENCOUNTER — Other Ambulatory Visit: Payer: Self-pay

## 2020-02-06 ENCOUNTER — Encounter (HOSPITAL_COMMUNITY): Payer: Self-pay

## 2020-02-06 ENCOUNTER — Emergency Department (HOSPITAL_COMMUNITY)
Admission: EM | Admit: 2020-02-06 | Discharge: 2020-02-06 | Disposition: A | Discharge status: Home / Self Care | Payer: 59 | Attending: Emergency Medicine | Admitting: Emergency Medicine

## 2020-02-06 DIAGNOSIS — Z96659 Presence of unspecified artificial knee joint: Secondary | ICD-10-CM | POA: Insufficient documentation

## 2020-02-06 DIAGNOSIS — Z87891 Personal history of nicotine dependence: Secondary | ICD-10-CM | POA: Insufficient documentation

## 2020-02-06 DIAGNOSIS — I5033 Acute on chronic diastolic (congestive) heart failure: Secondary | ICD-10-CM | POA: Diagnosis not present

## 2020-02-06 DIAGNOSIS — Z7901 Long term (current) use of anticoagulants: Secondary | ICD-10-CM | POA: Insufficient documentation

## 2020-02-06 DIAGNOSIS — I110 Hypertensive heart disease with heart failure: Secondary | ICD-10-CM | POA: Insufficient documentation

## 2020-02-06 DIAGNOSIS — J45909 Unspecified asthma, uncomplicated: Secondary | ICD-10-CM | POA: Insufficient documentation

## 2020-02-06 DIAGNOSIS — G43909 Migraine, unspecified, not intractable, without status migrainosus: Secondary | ICD-10-CM

## 2020-02-06 DIAGNOSIS — Z79899 Other long term (current) drug therapy: Secondary | ICD-10-CM | POA: Diagnosis not present

## 2020-02-06 MED ORDER — DIPHENHYDRAMINE HCL 50 MG/ML IJ SOLN
12.5000 mg | Freq: Once | INTRAMUSCULAR | Status: AC
  Administered 2020-02-06: 12.5 mg via INTRAVENOUS
  Filled 2020-02-06: qty 1

## 2020-02-06 MED ORDER — KETOROLAC TROMETHAMINE 30 MG/ML IJ SOLN
30.0000 mg | Freq: Once | INTRAMUSCULAR | Status: AC
  Administered 2020-02-06: 30 mg via INTRAVENOUS
  Filled 2020-02-06: qty 1

## 2020-02-06 MED ORDER — SODIUM CHLORIDE 0.9 % IV BOLUS
500.0000 mL | Freq: Once | INTRAVENOUS | Status: AC
  Administered 2020-02-06: 500 mL via INTRAVENOUS

## 2020-02-06 MED ORDER — METOCLOPRAMIDE HCL 5 MG/ML IJ SOLN
10.0000 mg | Freq: Once | INTRAMUSCULAR | Status: AC
  Administered 2020-02-06: 10 mg via INTRAVENOUS
  Filled 2020-02-06: qty 2

## 2020-02-06 NOTE — ED Provider Notes (Signed)
[facility] DEPT Provider Note   CSN: [id number] Arrival date & time: 02/06/20  1034     History Chief Complaint  Patient presents with  . Migraine    Melissa [name] is a 54 y.o. female. She has a history of daily migraines. They are usually responsive to her home regiment. She is here with complaint of a headache for the last 3 days that is been unrelenting. Started over her right eye but now is her entire head. Throbbing with sometimes a sharp component. Photophobia and phonophobia but no nausea or vomiting. No numbness or weakness. No blurry vision double vision. States she usually gets a headache like this once or twice a year and has to come to the ER. No trauma no fever  The history is provided by the patient.  Migraine This is a recurrent problem. The current episode started more than 2 days ago. The problem occurs constantly. The problem has not changed since onset.Associated symptoms include headaches. Pertinent negatives include no chest pain, no abdominal pain and no shortness of breath. Exacerbated by: light, noise. Nothing relieves the symptoms. She has tried rest for the symptoms. The treatment provided no relief.       Past Medical History:  Diagnosis Date  . (HFpEF) heart failure with preserved ejection fraction ([place])   . ADD (attention deficit disorder)   . Allergy   . Arthritis   . Asthma   . 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 Depression   . Family history of breast cancer   . Family history of thyroid cancer   . Family history of uterine cancer   . Migraine   . Osteoporosis   . Pulmonary hypertension ([place])   . RLS (restless legs syndrome)     Patient Active Problem List   Diagnosis Date Noted  . Chronic intractable headache 04/03/2019  . Genetic testing 02/01/2018  . Family history of breast cancer   . Family history of thyroid cancer   . Family history of uterine cancer   . Gastroesophageal reflux disease with esophagitis  09/07/2017  . Iron deficiency anemia 07/05/2017  . Persistent atrial fibrillation ([place]) 02/22/2017  . Chronic diastolic heart failure ([place]) 02/22/2017  . Migraine   . Depression   . Arthritis   . Allergy   . ADD (attention deficit disorder)   . Paroxysmal atrial fibrillation ([place]) 01/31/2017  . Pulmonary hypertension, unspecified ([place]) 01/14/2017  . History of atrial myxoma   . Atrial mass 12/28/2016  . Acute diastolic heart failure ([place])   . Migraines 12/24/2016  . Left atrial mass 12/24/2016  . (HFpEF) heart failure with preserved ejection fraction ([place]) 12/24/2016  . Asthma without status asthmaticus 12/20/2016  . 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/2016  . Weight gain 10/21/2016  . BMI 38.0-38.9,adult 09/24/2016  . Dyspnea on exertion 09/24/2016  . Intractable persistent migraine aura without cerebral infarction and without status migrainosus 09/24/2016  . Stress-related physiological response affecting medical condition 09/24/2016  . Fibromyalgia 08/28/2015  . Obesity (BMI 30-39.9) 08/28/2015  . Status migrainosus 07/11/2015  . Vitamin D deficiency 03/27/2015  . Migraine without aura, intractable, without status migrainosus 03/13/2015  . H/O gastric bypass 10/16/2013  . Heartburn 10/16/2013  . Osteoporosis 10/16/2013  . Hair loss 08/28/2012  . Disordered sleep 08/28/2012  . S/P gastric bypass 06/21/2012  . Insomnia 06/21/2012  . Postoperative anemia 01/12/2012  . MENOPAUSE-RELATED VASOMOTOR SYMPTOMS, HOT FLASHES 12/07/2007  . RLS (restless legs syndrome) 10/26/2007  . Allergic rhinitis 12/23/2006  . HEADACHE  12/23/2006  . ALLERGIC RHINITIS, SEASONAL 11/04/2006  . SYMPTOM, DYSFUNCTION, SLEEP STAGE 11/04/2006    Past Surgical History:  Procedure Laterality Date  . ABDOMINAL HYSTERECTOMY    . CARDIOVERSION N/A 01/10/2017   Procedure: CARDIOVERSION;  Surgeon: Dorothy Spark, MD;  Location: [facility] ENDOSCOPY;  Service: Cardiovascular;  Laterality: N/A;  . CHOLECYSTECTOMY    .  EXCISION OF ATRIAL MYXOMA Left 12/28/2016   Procedure: EXCISION OF LEFT ATRIAL MYXOMA;  Surgeon: Ivin Poot, MD;  Location: [employer] [facility];  Service: Open Heart Surgery;  Laterality: Left;  . FOOT SURGERY    . GASTRIC BYPASS    . INTRAOPERATIVE TRANSESOPHAGEAL ECHOCARDIOGRAM N/A 12/28/2016   Procedure: INTRAOPERATIVE TRANSESOPHAGEAL ECHOCARDIOGRAM;  Surgeon: Ivin Poot, MD;  Location: [place];  Service: Open Heart Surgery;  Laterality: N/A;  . TEE WITHOUT CARDIOVERSION N/A 01/10/2017   Procedure: TRANSESOPHAGEAL ECHOCARDIOGRAM (TEE);  Surgeon: Dorothy Spark, MD;  Location: [facility] ENDOSCOPY;  Service: Cardiovascular;  Laterality: N/A;  . TEE WITHOUT CARDIOVERSION N/A 05/17/2017   Procedure: TRANSESOPHAGEAL ECHOCARDIOGRAM (TEE);  Surgeon: Josue Hector, MD;  Location[facility] END[facility]COPY;  Service: Cardiovascular;  Laterality: N/A;  . TOTAL KNEE ARTHROPLASTY    . TUBAL LIGATION       OB History   No obstetric history on file.     Family History  Problem Relation Age of Onset  . Thyroid cancer Mother   . Endometrial cancer Sister 21  . Breast cancer Maternal Aunt        dx > 50  . Breast cancer Maternal Grandmother        dx >50  . Breast cancer Maternal Aunt        dx  >50  . Depression Other        FMHx  . Stroke Paternal Grandfather   . Migraines Neg Hx     Social History   Tobacco Use  . Smoking status: Former Smoker    Packs/day: 0.25    Years: 10.00    Pack years: 2.50    Types: Cigarettes    Quit date: 1997    Years since quitting: 24.8  . Smokeless tobacco: Never Used  Vaping Use  . Vaping Use: Never used  Substance Use Topics  . Alcohol use: Yes    Alcohol/week: 3.0 - 5.0 standard drinks    Types: 3 - 5 Glasses of wine per week  . Drug use: No    Home Medications Prior to Admission medications   Medication Sig Start Date End Date Taking? Authorizing Provider  acetaZOLAMIDE (DIAMOX) 250 MG tablet Take 2 tablets (500 mg total) by mouth 2 (two) times daily.  05/22/19   Melvenia Beam, MD  almotriptan (AXERT) 12.5 MG tablet SMARTSIG:1 Tablet(s) By Mouth 1 to 2 Times Daily 03/13/19   [provider]  amitriptyline (ELAVIL) 25 MG tablet Take 1-2 tablets (25-50 mg total) by mouth at bedtime. 04/03/19   Melvenia Beam, MD  Cholecalciferol (VITAMIN D3) 50000 units CAPS Take 1 capsule by mouth once a week.    [provider]  diltiazem (CARDIZEM) 30 MG tablet Take 1 tablet (30 mg total) every 6 (six) hours as needed by mouth. For fast heart rate. Continue long acting diltiazem, total of no more than 360 mg per day 01/29/17 01/29/18  Buford Dresser, MD  flecainide (TAMBOCOR) 50 MG tablet Take 1 tablet by mouth twice daily. 06/20/19   Josue Hector, MD  furosemide (LASIX) 40 MG tablet As needed for weight  gain 04/28/17   End, Harrell Gave, MD  HYDROcodone-acetaminophen (NORCO) 10-325 MG tablet Take 1 tablet by mouth 2 (two) times daily as needed. 03/31/19   [provider]  methylPREDNISolone (MEDROL DOSEPAK) 4 MG TBPK tablet follow package directions 05/10/19   Melvenia Beam, MD  metoprolol tartrate (LOPRESSOR) 25 MG tablet Take 1 and 1/2 tablets by mouth twice daily. 06/20/19   Josue Hector, MD  Multiple Vitamin (MULTIVITAMIN) tablet Take 1 tablet by mouth 2 (two) times daily.     [provider]  potassium chloride SA (K-DUR,KLOR-CON) 20 MEQ tablet As needed for weight gain with Lasix 04/28/17   End, Harrell Gave, MD  pramipexole (MIRAPEX) 1 MG tablet Take 1 mg by mouth 2 (two) times daily.     [provider]  promethazine (PHENERGAN) 12.5 MG tablet Take 1-2 tablets by mouth 3 (three) times daily as needed for nausea/vomiting. 12/17/16   [provider]  rizatriptan (MAXALT-MLT) 10 MG disintegrating tablet Take 1 tablet (10 mg total) by mouth as needed for migraine. May repeat in 2 hours if needed 04/17/19   Melvenia Beam, MD  tiZANidine (ZANAFLEX) 2 MG tablet Take 2 mg by mouth every 6 (six) hours  as needed (headache).  10/11/15   [provider]  XARELTO 20 MG TABS tablet Take 1 tablet by mouth daily. 06/20/19   Josue Hector, MD    Allergies    Other  Review of Systems   Review of Systems  Constitutional: Negative for fever.  HENT: Negative for sore throat.   Eyes: Negative for visual disturbance.  Respiratory: Negative for shortness of breath.   Cardiovascular: Negative for chest pain.  Gastrointestinal: Negative for abdominal pain.  Genitourinary: Negative for dysuria.  Musculoskeletal: Negative for neck pain.  Skin: Negative for rash.  Neurological: Positive for headaches.    Physical Exam Updated Vital Signs BP (!) 142/88 (BP Location: Left Arm)   Pulse 65   Temp 98.5 F (36.9 C) (Oral)   Resp 16   SpO2 98%   Physical Exam Vitals and nursing note reviewed.  Constitutional:      General: She is not in acute distress.    Appearance: Normal appearance. She is well-developed.  HENT:     Head: Normocephalic and atraumatic.  Eyes:     Conjunctiva/sclera: Conjunctivae normal.  Cardiovascular:     Rate and Rhythm: Normal rate and regular rhythm.     Heart sounds: No murmur heard.   Pulmonary:     Effort: Pulmonary effort is normal. No respiratory distress.     Breath sounds: Normal breath sounds.  Abdominal:     Palpations: Abdomen is soft.     Tenderness: There is no abdominal tenderness.  Musculoskeletal:        General: No deformity. Normal range of motion.     Cervical back: Neck supple.  Skin:    General: Skin is warm and dry.     Capillary Refill: Capillary refill takes less than 2 seconds.  Neurological:     General: No focal deficit present.     Mental Status: She is alert.     ED Results / Procedures / Treatments   Labs (all labs ordered are listed, but only abnormal results are displayed) Labs Reviewed - No data to display  EKG None  Radiology No results found.  Procedures Procedures (including critical care  time)  Medications Ordered in ED Medications  metoCLOPramide (REGLAN) injection 10 mg (has no administration in time range)  ketorolac (TORADOL) 30 MG/ML injection 30 mg (has no administration in time range)  sodium chloride 0.9 % bolus 500 mL (has no administration in time range)  diphenhydrAMINE (BENADRYL) injection 12.5 mg (has no administration in time range)    ED Course  I have reviewed the triage vital signs and the nursing notes.  Pertinent labs & imaging results that were available during my care of the patient were reviewed by me and considered in my medical decision making (see chart for details).  Clinical Course as of Feb 05 1901  Wed Feb 06, 2020  1244 Reevaluated patient after medications.  She said she has moderate relief.  She is comfortable going home.  Return instructions discussed.   [MB]    Clinical Course User Index [MB] Hayden Rasmussen, MD   MDM Rules/Calculators/A&P                         54 year old female with history of daily migraines here with worsening of her migraine headache not responsive to her typical treatment.  Differential includes migraine, tension headache, less likely subarachnoid, tumor.  She received a migraine cocktail with improvement in her symptoms.  Recommended close follow-up with her PCP and return instructions discussed.  Final Clinical Impression(s) / ED Diagnoses Final diagnoses:  Migraine without status migrainosus, not intractable, unspecified migraine type    Rx / DC Orders ED Discharge Orders    None       Hayden Rasmussen, MD 02/06/20 [phone]

## 2020-02-06 NOTE — Discharge Instructions (Addendum)
You were seen in the emergency department for a migraine headache.  Your symptoms improved with some medication.  Please drink plenty of fluids and get rest.  Follow-up with your primary care doctor.  Return to the emergency department for any worsening or concerning symptoms.

## 2020-02-06 NOTE — ED Triage Notes (Signed)
Pt reports she has had a migraine since Monday. Pt denies N/V. Pt reports hx of migraines and states that has been taking all of her preventative and PRN medications without relief.

## 2020-02-20 ENCOUNTER — Telehealth: Payer: Self-pay

## 2020-02-20 NOTE — Telephone Encounter (Signed)
Patient called she is requesting Dr.Newton to look over her MRI that was done back in October, MRI of her neck. She would like a call back from [place]. [phone]

## 2020-02-20 NOTE — Telephone Encounter (Signed)
Please advise 

## 2020-02-21 NOTE — Telephone Encounter (Signed)
I looked at her MRI myself and agree with the radiologist report that she has very mild findings for her age and her cervical spine with very mild degenerative disc height loss at C4-5 and C5-6 without foraminal narrowing nerve compression or stenosis.  No herniated disc.  A lot of her neck pain I believe is myofascial in nature and we talked about that in the past.  Appropriate treatment is physical therapy strengthening and manual type treatment.  I do not really have any injections that I would recommend from a spine standpoint.

## 2020-02-21 NOTE — Telephone Encounter (Signed)
Called patient to advise  °

## 2020-02-22 ENCOUNTER — Ambulatory Visit: Payer: 59

## 2020-04-08 ENCOUNTER — Encounter: Payer: Self-pay | Admitting: Nurse Practitioner

## 2020-04-10 ENCOUNTER — Inpatient Hospital Stay: Payer: 59

## 2020-04-10 ENCOUNTER — Inpatient Hospital Stay: Payer: 59 | Admitting: Nurse Practitioner

## 2020-04-14 ENCOUNTER — Other Ambulatory Visit: Payer: Self-pay

## 2020-04-14 ENCOUNTER — Emergency Department (HOSPITAL_COMMUNITY): Payer: 59

## 2020-04-14 DIAGNOSIS — Z7901 Long term (current) use of anticoagulants: Secondary | ICD-10-CM | POA: Diagnosis not present

## 2020-04-14 DIAGNOSIS — I5032 Chronic diastolic (congestive) heart failure: Secondary | ICD-10-CM | POA: Diagnosis not present

## 2020-04-14 DIAGNOSIS — I110 Hypertensive heart disease with heart failure: Secondary | ICD-10-CM | POA: Insufficient documentation

## 2020-04-14 DIAGNOSIS — M79621 Pain in right upper arm: Secondary | ICD-10-CM | POA: Insufficient documentation

## 2020-04-14 DIAGNOSIS — J45909 Unspecified asthma, uncomplicated: Secondary | ICD-10-CM | POA: Insufficient documentation

## 2020-04-14 DIAGNOSIS — Z96659 Presence of unspecified artificial knee joint: Secondary | ICD-10-CM | POA: Diagnosis not present

## 2020-04-14 DIAGNOSIS — Z79899 Other long term (current) drug therapy: Secondary | ICD-10-CM | POA: Diagnosis not present

## 2020-04-14 DIAGNOSIS — R109 Unspecified abdominal pain: Secondary | ICD-10-CM | POA: Diagnosis present

## 2020-04-14 DIAGNOSIS — Z87891 Personal history of nicotine dependence: Secondary | ICD-10-CM | POA: Diagnosis not present

## 2020-04-14 LAB — CBC WITH DIFFERENTIAL/PLATELET
Abs Immature Granulocytes: 0.02 10*3/uL (ref 0.00–0.07)
Basophils Absolute: 0.1 10*3/uL (ref 0.0–0.1)
Basophils Relative: 1 %
Eosinophils Absolute: 0.2 10*3/uL (ref 0.0–0.5)
Eosinophils Relative: 3 %
HCT: 40.8 % (ref 36.0–46.0)
Hemoglobin: 13.7 g/dL (ref 12.0–15.0)
Immature Granulocytes: 0 %
Lymphocytes Relative: 34 %
Lymphs Abs: 2.6 10*3/uL (ref 0.7–4.0)
MCH: 30.2 pg (ref 26.0–34.0)
MCHC: 33.6 g/dL (ref 30.0–36.0)
MCV: 89.9 fL (ref 80.0–100.0)
Monocytes Absolute: 0.5 10*3/uL (ref 0.1–1.0)
Monocytes Relative: 7 %
Neutro Abs: 4.2 10*3/uL (ref 1.7–7.7)
Neutrophils Relative %: 55 %
Platelets: 277 10*3/uL (ref 150–400)
RBC: 4.54 MIL/uL (ref 3.87–5.11)
RDW: 13.2 % (ref 11.5–15.5)
WBC: 7.6 10*3/uL (ref 4.0–10.5)
nRBC: 0 % (ref 0.0–0.2)

## 2020-04-14 NOTE — ED Triage Notes (Signed)
Patient reports to the ER for side/flank pain at the bra line. Patient reports she was told to come to the ER by her PCP for worsening pain. Patient states she had a urinalysis done today that was unremarkable.

## 2020-04-15 ENCOUNTER — Emergency Department (HOSPITAL_COMMUNITY): Payer: 59

## 2020-04-15 ENCOUNTER — Encounter (HOSPITAL_COMMUNITY): Payer: Self-pay | Admitting: Radiology

## 2020-04-15 ENCOUNTER — Emergency Department (HOSPITAL_COMMUNITY)
Admission: EM | Admit: 2020-04-15 | Discharge: 2020-04-15 | Disposition: A | Discharge status: Home / Self Care | Payer: 59 | Attending: Emergency Medicine | Admitting: Emergency Medicine

## 2020-04-15 DIAGNOSIS — M79621 Pain in right upper arm: Secondary | ICD-10-CM

## 2020-04-15 LAB — BASIC METABOLIC PANEL
Anion gap: 12 (ref 5–15)
BUN: 18 mg/dL (ref 6–20)
CO2: 21 mmol/L — ABNORMAL LOW (ref 22–32)
Calcium: 9.4 mg/dL (ref 8.9–10.3)
Chloride: 104 mmol/L (ref 98–111)
Creatinine, Ser: 0.53 mg/dL (ref 0.44–1.00)
GFR, Estimated: >60 mL/min (ref >60)
Glucose, Bld: 81 mg/dL (ref 70–99)
Potassium: 3.8 mmol/L (ref 3.5–5.1)
Sodium: 137 mmol/L (ref 135–145)

## 2020-04-15 LAB — D-DIMER, QUANTITATIVE: D-Dimer, Quant: 0.63 ug/mL-FEU — ABNORMAL HIGH (ref 0.00–0.50)

## 2020-04-15 MED ORDER — HYDROCODONE-ACETAMINOPHEN 5-325 MG PO TABS: 1.0000 | ORAL_TABLET | Freq: Four times a day (QID) | ORAL | 0 refills | Status: DC | PRN

## 2020-04-15 MED ORDER — KETOROLAC TROMETHAMINE 30 MG/ML IJ SOLN
30.0000 mg | Freq: Once | INTRAMUSCULAR | Status: AC
  Administered 2020-04-15: 30 mg via INTRAVENOUS
  Filled 2020-04-15: qty 1

## 2020-04-15 MED ORDER — LIDOCAINE 5 % EX OINT: 1.0000 "application " | TOPICAL_OINTMENT | Freq: Three times a day (TID) | CUTANEOUS | 0 refills | Status: DC | PRN

## 2020-04-15 MED ORDER — HYDROMORPHONE HCL 1 MG/ML IJ SOLN
1.0000 mg | Freq: Once | INTRAMUSCULAR | Status: AC
  Administered 2020-04-15: 1 mg via INTRAVENOUS
  Filled 2020-04-15: qty 1

## 2020-04-15 MED ORDER — IOHEXOL 350 MG/ML SOLN
100.0000 mL | Freq: Once | INTRAVENOUS | Status: AC | PRN
  Administered 2020-04-15: 100 mL via INTRAVENOUS

## 2020-04-15 NOTE — Discharge Instructions (Signed)
You were seen in the emerge department today with pain along your right side.  The lab work and CT scans performed today did not show any acute cause of your pain.  Please be on the look out for rash and call your primary care doctor if one develops.  I have called in some topical pain medication as well as a small amount of your home pain medication to get you through to the end of the month.  Please take only as prescribed.  Do not take with alcohol or drive a car while taking this medication as it can cause drowsiness.  Return to the emergency department any new or suddenly worsening symptoms.

## 2020-04-15 NOTE — ED Provider Notes (Signed)
[facility] DEPT Provider Note   CSN: [id number] Arrival date & time: 04/14/20  2307     History Chief Complaint  Patient presents with  . Flank Pain    Melissa [name] is a 55 y.o. female.  HPI Patient presents with concern of pain. Pain began without clear precipitant 3 days ago.  Since that time it has been persistent, though with waxing, waning severity.  When most prominent the pain is substantial, not improved with Vicodin, tizanidine. Is nonradiating, around the right inferior costal margin laterally, anterior and posterior.  No new dyspnea, other chest pain, no other abdominal pain, no nausea, vomiting, diarrhea, no urinary complaints. She does have history of multiple medical issues including myxoma removal from her heart, gastric bypass, cholecystectomy.    Past Medical History:  Diagnosis Date  . (HFpEF) heart failure with preserved ejection fraction ([place])   . ADD (attention deficit disorder)   . Allergy   . Arthritis   . Asthma   . 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 Depression   . Family history of breast cancer   . Family history of thyroid cancer   . Family history of uterine cancer   . Migraine   . Osteoporosis   . Pulmonary hypertension ([place])   . RLS (restless legs syndrome)     Patient Active Problem List   Diagnosis Date Noted  . Chronic intractable headache 04/03/2019  . Genetic testing 02/01/2018  . Family history of breast cancer   . Family history of thyroid cancer   . Family history of uterine cancer   . Gastroesophageal reflux disease with esophagitis 09/07/2017  . Iron deficiency anemia 07/05/2017  . Persistent atrial fibrillation ([place]) 02/22/2017  . Chronic diastolic heart failure ([place]) 02/22/2017  . Migraine   . Depression   . Arthritis   . Allergy   . ADD (attention deficit disorder)   . Paroxysmal atrial fibrillation ([place]) 01/31/2017  . Pulmonary hypertension, unspecified ([place]) 01/14/2017  . History of atrial  myxoma   . Atrial mass 12/28/2016  . Acute diastolic heart failure ([place])   . Migraines 12/24/2016  . Left atrial mass 12/24/2016  . (HFpEF) heart failure with preserved ejection fraction ([place]) 12/24/2016  . Asthma without status asthmaticus 12/20/2016  . 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/2016  . Weight gain 10/21/2016  . BMI 38.0-38.9,adult 09/24/2016  . Dyspnea on exertion 09/24/2016  . Intractable persistent migraine aura without cerebral infarction and without status migrainosus 09/24/2016  . Stress-related physiological response affecting medical condition 09/24/2016  . Fibromyalgia 08/28/2015  . Obesity (BMI 30-39.9) 08/28/2015  . Status migrainosus 07/11/2015  . Vitamin D deficiency 03/27/2015  . Migraine without aura, intractable, without status migrainosus 03/13/2015  . H/O gastric bypass 10/16/2013  . Heartburn 10/16/2013  . Osteoporosis 10/16/2013  . Hair loss 08/28/2012  . Disordered sleep 08/28/2012  . S/P gastric bypass 06/21/2012  . Insomnia 06/21/2012  . Postoperative anemia 01/12/2012  . MENOPAUSE-RELATED VASOMOTOR SYMPTOMS, HOT FLASHES 12/07/2007  . RLS (restless legs syndrome) 10/26/2007  . Allergic rhinitis 12/23/2006  . HEADACHE 12/23/2006  . ALLERGIC RHINITIS, SEASONAL 11/04/2006  . SYMPTOM, DYSFUNCTION, SLEEP STAGE 11/04/2006    Past Surgical History:  Procedure Laterality Date  . ABDOMINAL HYSTERECTOMY    . CARDIOVERSION N/A 01/10/2017   Procedure: CARDIOVERSION;  Surgeon: Dorothy Spark, MD;  [facility];  Service: Cardiovascular;  Laterality: N/A;  . CHOLECYSTECTOMY    . EXCISION OF ATRIAL MYXOMA Left 12/28/2016   Procedure: EXCISION OF LEFT ATRIAL  MYXOMA;  Surgeon: Ivin Poot, MD;  Location: [place];  Service: Open Heart Surgery;  Laterality: Left;  . FOOT SURGERY    . GASTRIC BYPASS    . INTRAOPERATIVE TRANSESOPHAGEAL ECHOCARDIOGRAM N/A 12/28/2016   Procedure: INTRAOPERATIVE TRANSESOPHAGEAL ECHOCARDIOGRAM;  Surgeon: Ivin Poot, MD;   Location: [place];  Service: Open Heart Surgery;  Laterality: N/A;  . TEE WITHOUT CARDIOVERSION N/A 01/10/2017   Procedure: TRANSESOPHAGEAL ECHOCARDIOGRAM (TEE);  Surgeon: Dorothy Spark, MD;  Location: [facility] ENDOSCOPY;  Service: Cardiovascular;  Laterality: N/A;  . TEE WITHOUT CARDIOVERSION N/A 05/17/2017   Procedure: TRANSESOPHAGEAL ECHOCARDIOGRAM (TEE);  Surgeon: Josue Hector, MD;  Location: [facility] ENDOSCOPY;  Service: Cardiovascular;  Laterality: N/A;  . TOTAL KNEE ARTHROPLASTY    . TUBAL LIGATION       OB History   No obstetric history on file.     Family History  Problem Relation Age of Onset  . Thyroid cancer Mother   . Endometrial cancer Sister 51  . Breast cancer Maternal Aunt        dx > 50  . Breast cancer Maternal Grandmother        dx >50  . Breast cancer Maternal Aunt        dx  >50  . Depression Other        FMHx  . Stroke Paternal Grandfather   . Migraines Neg Hx     Social History   Tobacco Use  . Smoking status: Former Smoker    Packs/day: 0.25    Years: 10.00    Pack years: 2.50    Types: Cigarettes    Quit date: 1997    Years since quitting: 25.0  . Smokeless tobacco: Never Used  Vaping Use  . Vaping Use: Never used  Substance Use Topics  . Alcohol use: Yes    Alcohol/week: 3.0 - 5.0 standard drinks    Types: 3 - 5 Glasses of wine per week  . Drug use: No    Home Medications Prior to Admission medications   Medication Sig Start Date End Date Taking? Authorizing Provider  AJOVY 225 MG/1.5ML SOAJ Inject 225 mg into the skin every 30 (thirty) days. 03/17/20  Yes [provider]  Cholecalciferol (VITAMIN D3) 125 MCG (5000 UT) CAPS Take 5,000 Units by mouth daily.   Yes [provider]  diltiazem (CARDIZEM) 30 MG tablet Take 1 tablet (30 mg total) every 6 (six) hours as needed by mouth. For fast heart rate. Continue long acting diltiazem, total of no more than 360 mg per day 01/29/17 01/29/18 Yes Buford Dresser, MD  FEMRING  0.05 MG/24HR RING Place 0.05 mg vaginally every 3 (three) months. 11/21/19  Yes [provider]  flecainide (TAMBOCOR) 50 MG tablet Take 1 tablet by mouth twice daily. Patient taking differently: Take 50 mg by mouth 2 (two) times daily. 06/20/19  Yes Josue Hector, MD  furosemide (LASIX) 40 MG tablet As needed for weight gain 04/28/17  Yes End, Harrell Gave, MD  HYDROcodone-acetaminophen (NORCO) 10-325 MG tablet Take 1 tablet by mouth 2 (two) times daily as needed for moderate pain. 03/31/19  Yes [provider]  metoprolol tartrate (LOPRESSOR) 25 MG tablet Take 1 and 1/2 tablets by mouth twice daily. Patient taking differently: Take 37.5 mg by mouth 2 (two) times daily. Take 1 and 1/2 tablets by mouth twice daily. 06/20/19  Yes Josue Hector, MD  Multiple Vitamin (MULTIVITAMIN) tablet Take 1 tablet by mouth 2 (two) times daily.  Yes [provider]  NURTEC 75 MG TBDP Take 75 mg by mouth every other day. 03/07/20  Yes [provider]  potassium chloride SA (K-DUR,KLOR-CON) 20 MEQ tablet As needed for weight gain with Lasix 04/28/17  Yes End, Harrell Gave, MD  pramipexole (MIRAPEX) 1 MG tablet Take 1 mg by mouth 2 (two) times daily.    Yes [provider]  promethazine (PHENERGAN) 12.5 MG tablet Take 12.5-25 mg by mouth 3 (three) times daily as needed for nausea/vomiting. 12/17/16  Yes [provider]  tiZANidine (ZANAFLEX) 2 MG tablet Take 2 mg by mouth every 6 (six) hours as needed (headache).  10/11/15  Yes [provider]  XARELTO 20 MG TABS tablet Take 1 tablet by mouth daily. Patient taking differently: Take 20 mg by mouth daily with supper. 06/20/19  Yes Josue Hector, MD  acetaZOLAMIDE (DIAMOX) 250 MG tablet Take 2 tablets (500 mg total) by mouth 2 (two) times daily. Patient not taking: Reported on 04/15/2020 05/22/19   Melvenia Beam, MD  amitriptyline (ELAVIL) 25 MG tablet Take 1-2 tablets (25-50 mg total) by mouth at bedtime. Patient  not taking: Reported on 04/15/2020 04/03/19   Melvenia Beam, MD  methylPREDNISolone (MEDROL DOSEPAK) 4 MG TBPK tablet follow package directions Patient not taking: Reported on 04/15/2020 05/10/19   Melvenia Beam, MD  rizatriptan (MAXALT-MLT) 10 MG disintegrating tablet Take 1 tablet (10 mg total) by mouth as needed for migraine. May repeat in 2 hours if needed Patient not taking: Reported on 04/15/2020 04/17/19   Melvenia Beam, MD    Allergies    Other  Review of Systems   Review of Systems  Constitutional:       Per HPI, otherwise negative  HENT:       Per HPI, otherwise negative  Respiratory:       Per HPI, otherwise negative  Cardiovascular:       Per HPI, otherwise negative  Gastrointestinal: Negative for vomiting.  Endocrine:       Negative aside from HPI  Genitourinary:       Neg aside from HPI   Musculoskeletal:       Per HPI, otherwise negative  Skin: Negative.   Neurological: Negative for syncope.    Physical Exam Updated Vital Signs BP (!) 146/80 (BP Location: Left Arm)   Pulse 78   Temp 99.1 F (37.3 C) (Oral)   Resp 16   SpO2 99%   Physical Exam Vitals and nursing note reviewed.  Constitutional:      General: She is not in acute distress.    Appearance: She is well-developed and well-nourished.  HENT:     Head: Normocephalic and atraumatic.  Eyes:     Extraocular Movements: EOM normal.     Conjunctiva/sclera: Conjunctivae normal.  Cardiovascular:     Rate and Rhythm: Normal rate and regular rhythm.  Pulmonary:     Effort: Pulmonary effort is normal. No respiratory distress.     Breath sounds: Normal breath sounds. No stridor.  Abdominal:     General: There is no distension.    Musculoskeletal:        General: No edema.  Skin:    General: Skin is warm and dry.     Findings: No rash.     Comments: No rash or appreciable new skin lesion in the area  Neurological:     Mental Status: She is alert and oriented to person, place, and time.  Cranial Nerves: No cranial nerve deficit.  Psychiatric:        Mood and Affect: Mood and affect normal.     ED Results / Procedures / Treatments   Labs (all labs ordered are listed, but only abnormal results are displayed) Labs Reviewed  BASIC METABOLIC PANEL - Abnormal; Notable for the following components:      Result Value   CO2 21 (*)    All other components within normal limits  D-DIMER, QUANTITATIVE (NOT AT [facility]) - Abnormal; Notable for the following components:   D-Dimer, Quant 0.63 (*)    All other components within normal limits  CBC WITH DIFFERENTIAL/PLATELET    EKG None  Radiology DG Chest 2 View  Result Date: 04/14/2020 CLINICAL DATA:  Right-sided pain inferior to the axillary region. History of asthma. EXAM: CHEST - 2 VIEW COMPARISON:  02/07/2017 FINDINGS: Postoperative changes in the mediastinum. Normal heart size and pulmonary vascularity. No focal airspace disease or consolidation in the lungs. No blunting of costophrenic angles. No pneumothorax. Mediastinal contours appear intact. Degenerative changes in the spine. IMPRESSION: No active cardiopulmonary disease. Electronically Signed   By: Lucienne Capers M.D.   On: 04/14/2020 23:43    Procedures Procedures   Medications Ordered in ED Medications  ketorolac (TORADOL) 30 MG/ML injection 30 mg (has no administration in time range)  HYDROmorphone (DILAUDID) injection 1 mg (has no administration in time range)    ED Course  I have reviewed the triage vital signs and the nursing notes.  Pertinent labs & imaging results that were available during my care of the patient were reviewed by me and considered in my medical decision making (see chart for details).  Adult female with multiple medical issues including prior atrial myxoma resection, gastric bypass now presents with new pain in the right anterior inferior axillary line about the costal margin above and below. Patient is awake, alert, hemodynamically  unremarkable, has no other GI complaints, no abdominal pain has no GU complaints, has no other respiratory complaints.  However, the patient has had no relief with multiple attempts at analgesia, and with initial labs notable for positive D-dimer, CT scans were performed. Initial look at the CT scans did not demonstrate acute findings, the patient will require additional evaluation when results are available.  Dr. Laverta Baltimore is aware of the patient.  Final Clinical Impression(s) / ED Diagnoses Final diagnoses:  Pain in right axilla     Carmin Muskrat, MD 04/15/20 1645

## 2020-04-15 NOTE — ED Provider Notes (Signed)
Blood pressure (!) 146/80, pulse 78, temperature 99.1 F (37.3 C), temperature source Oral, resp. rate 16, SpO2 99 %.  Assuming care from Dr. Vanita Panda.  In short, Melissa [name] is a 55 y.o. female with a chief complaint of Flank Pain .  Refer to the original H&P for additional details.  The current plan of care is to f/u on CT renal and CTA chest.   04:55 PM  CT scans of the chest, abdomen, pelvis reviewed.  Patient has no PE or other acute process to explain her pain symptoms.  She takes Norco at home for pain but will need some additional medication to make it to the end of the month.  This seems reasonable given her acute pain.  She cannot take anti-inflammatory medications.  Will prescribe some lidocaine gel which she can apply topically.  Patient tells me she is not under pain contract.  We discussed the possibility of pain prior to a zoster type rash and she will be on the look out for a rash to this location but none visible now. [place] drug database reviewed prior to opiate Rx.     Margette Fast, MD 04/15/20 [phone]

## 2020-04-15 NOTE — ED Notes (Signed)
Pt in room. Pt denies any complaints or concerns at this time. Pt denies any need for warm blanket. Vanita Panda MD at bedside

## 2020-04-17 ENCOUNTER — Other Ambulatory Visit: Payer: 59

## 2020-04-17 ENCOUNTER — Ambulatory Visit: Payer: 59 | Admitting: Nurse Practitioner

## 2020-05-16 ENCOUNTER — Other Ambulatory Visit: Payer: Self-pay

## 2020-05-16 DIAGNOSIS — I480 Paroxysmal atrial fibrillation: Secondary | ICD-10-CM

## 2020-05-16 MED ORDER — METOPROLOL TARTRATE 25 MG PO TABS
37.5000 mg | ORAL_TABLET | Freq: Two times a day (BID) | ORAL | 0 refills | Status: DC
Start: 2020-05-16 — End: 2020-06-19

## 2020-05-16 MED ORDER — FLECAINIDE ACETATE 50 MG PO TABS: 50.0000 mg | ORAL_TABLET | Freq: Two times a day (BID) | ORAL | 0 refills | Status: DC

## 2020-05-16 MED ORDER — RIVAROXABAN 20 MG PO TABS
20.0000 mg | ORAL_TABLET | Freq: Every day | ORAL | 1 refills | Status: DC
Start: 2020-05-16 — End: 2021-01-22

## 2020-05-16 NOTE — Telephone Encounter (Signed)
Prescription refill request for Xarelto received.   Indication: Afib Last office visit: Nishan, 05/04/2019 Weight: 100.8 kg  Age: 55 yo  Scr: 0.53, 04/14/2020 CrCl: 188 ml/min   Pt is on the correct dose of Xarelto per dosing Criteria Xarelto 20mg  daily. Prescription refill sent.

## 2020-06-19 ENCOUNTER — Other Ambulatory Visit: Payer: Self-pay

## 2020-06-19 DIAGNOSIS — I480 Paroxysmal atrial fibrillation: Secondary | ICD-10-CM

## 2020-06-19 MED ORDER — METOPROLOL TARTRATE 25 MG PO TABS
37.5000 mg | ORAL_TABLET | Freq: Two times a day (BID) | ORAL | 0 refills | Status: DC
Start: 2020-06-19 — End: 2020-07-15

## 2020-06-19 MED ORDER — FLECAINIDE ACETATE 50 MG PO TABS: 50.0000 mg | ORAL_TABLET | Freq: Two times a day (BID) | ORAL | 0 refills | Status: DC

## 2020-07-10 ENCOUNTER — Other Ambulatory Visit: Payer: Self-pay

## 2020-07-10 ENCOUNTER — Emergency Department (HOSPITAL_BASED_OUTPATIENT_CLINIC_OR_DEPARTMENT_OTHER)
Admission: EM | Admit: 2020-07-10 | Discharge: 2020-07-10 | Disposition: A | Discharge status: Home / Self Care | Payer: 59 | Attending: Emergency Medicine | Admitting: Emergency Medicine

## 2020-07-10 ENCOUNTER — Encounter (HOSPITAL_BASED_OUTPATIENT_CLINIC_OR_DEPARTMENT_OTHER): Payer: Self-pay

## 2020-07-10 DIAGNOSIS — Z96659 Presence of unspecified artificial knee joint: Secondary | ICD-10-CM | POA: Insufficient documentation

## 2020-07-10 DIAGNOSIS — Z7901 Long term (current) use of anticoagulants: Secondary | ICD-10-CM | POA: Insufficient documentation

## 2020-07-10 DIAGNOSIS — G43901 Migraine, unspecified, not intractable, with status migrainosus: Secondary | ICD-10-CM

## 2020-07-10 DIAGNOSIS — Z87891 Personal history of nicotine dependence: Secondary | ICD-10-CM | POA: Insufficient documentation

## 2020-07-10 DIAGNOSIS — I5033 Acute on chronic diastolic (congestive) heart failure: Secondary | ICD-10-CM | POA: Diagnosis not present

## 2020-07-10 DIAGNOSIS — G43909 Migraine, unspecified, not intractable, without status migrainosus: Secondary | ICD-10-CM | POA: Diagnosis present

## 2020-07-10 DIAGNOSIS — J45909 Unspecified asthma, uncomplicated: Secondary | ICD-10-CM | POA: Insufficient documentation

## 2020-07-10 MED ORDER — METOCLOPRAMIDE HCL 5 MG/ML IJ SOLN
10.0000 mg | Freq: Once | INTRAMUSCULAR | Status: AC
  Administered 2020-07-10: 10 mg via INTRAVENOUS
  Filled 2020-07-10: qty 2

## 2020-07-10 MED ORDER — DIPHENHYDRAMINE HCL 50 MG/ML IJ SOLN
12.5000 mg | Freq: Once | INTRAMUSCULAR | Status: AC
  Administered 2020-07-10: 12.5 mg via INTRAVENOUS
  Filled 2020-07-10: qty 1

## 2020-07-10 MED ORDER — KETOROLAC TROMETHAMINE 30 MG/ML IJ SOLN
30.0000 mg | Freq: Once | INTRAMUSCULAR | Status: AC
  Administered 2020-07-10: 30 mg via INTRAVENOUS
  Filled 2020-07-10: qty 1

## 2020-07-10 MED ORDER — SODIUM CHLORIDE 0.9 % IV SOLN
1000.0000 mL | INTRAVENOUS | Status: DC
  Administered 2020-07-10: 1000 mL via INTRAVENOUS

## 2020-07-10 MED ORDER — SODIUM CHLORIDE 0.9 % IV BOLUS (SEPSIS)
500.0000 mL | Freq: Once | INTRAVENOUS | Status: AC
  Administered 2020-07-10: 500 mL via INTRAVENOUS

## 2020-07-10 NOTE — Discharge Instructions (Addendum)
Continue your current medications.  Follow-up with your neurologist.  Return as needed for worsening symptoms

## 2020-07-10 NOTE — ED Triage Notes (Signed)
He c/o typical migraine h/a, with pain being = R & L and is mainly frontal. She is in no distress.

## 2020-07-10 NOTE — ED Provider Notes (Signed)
[place] EMERGENCY DEPT Provider Note   CSN: [id number] Arrival date & time: 07/10/20  1214     History Chief Complaint  Patient presents with  . Migraine    Melissa [name] is a 55 y.o. female.  HPI   Patient presents to the ED for evaluation of headache.  Patient states she has a history of migraine headaches.  She sees a Garment/textile technologist at [facility].  Patient started having a headache earlier this week.  Its bilateral frontal and very typical for her migraine headaches.  Patient has tried to manage this at home but the headache has persisted.  Occasionally gets this bad and she has required treatment in the ED.  Patient denies any fevers or chills.  No vomiting or diarrhea.  Denies any focal numbness or weakness.  Past Medical History:  Diagnosis Date  . (HFpEF) heart failure with preserved ejection fraction ([place])   . ADD (attention deficit disorder)   . Allergy   . Arthritis   . Asthma   . 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 Depression   . Family history of breast cancer   . Family history of thyroid cancer   . Family history of uterine cancer   . Migraine   . Osteoporosis   . Pulmonary hypertension ([place])   . RLS (restless legs syndrome)     Patient Active Problem List   Diagnosis Date Noted  . Chronic intractable headache 04/03/2019  . Genetic testing 02/01/2018  . Family history of breast cancer   . Family history of thyroid cancer   . Family history of uterine cancer   . Gastroesophageal reflux disease with esophagitis 09/07/2017  . Iron deficiency anemia 07/05/2017  . Persistent atrial fibrillation ([place]) 02/22/2017  . Chronic diastolic heart failure ([place]) 02/22/2017  . Migraine   . Depression   . Arthritis   . Allergy   . ADD (attention deficit disorder)   . Paroxysmal atrial fibrillation ([place]) 01/31/2017  . Pulmonary hypertension, unspecified ([place]) 01/14/2017  . History of atrial myxoma   . Atrial mass 12/28/2016  . Acute diastolic heart failure ([place])    . Migraines 12/24/2016  . Left atrial mass 12/24/2016  . (HFpEF) heart failure with preserved ejection fraction ([place]) 12/24/2016  . Asthma without status asthmaticus 12/20/2016  . 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/2016  . Weight gain 10/21/2016  . BMI 38.0-38.9,adult 09/24/2016  . Dyspnea on exertion 09/24/2016  . Intractable persistent migraine aura without cerebral infarction and without status migrainosus 09/24/2016  . Stress-related physiological response affecting medical condition 09/24/2016  . Fibromyalgia 08/28/2015  . Obesity (BMI 30-39.9) 08/28/2015  . Status migrainosus 07/11/2015  . Vitamin D deficiency 03/27/2015  . Migraine without aura, intractable, without status migrainosus 03/13/2015  . H/O gastric bypass 10/16/2013  . Heartburn 10/16/2013  . Osteoporosis 10/16/2013  . Hair loss 08/28/2012  . Disordered sleep 08/28/2012  . S/P gastric bypass 06/21/2012  . Insomnia 06/21/2012  . Postoperative anemia 01/12/2012  . MENOPAUSE-RELATED VASOMOTOR SYMPTOMS, HOT FLASHES 12/07/2007  . RLS (restless legs syndrome) 10/26/2007  . Allergic rhinitis 12/23/2006  . HEADACHE 12/23/2006  . ALLERGIC RHINITIS, SEASONAL 11/04/2006  . SYMPTOM, DYSFUNCTION, SLEEP STAGE 11/04/2006    Past Surgical History:  Procedure Laterality Date  . ABDOMINAL HYSTERECTOMY    . CARDIOVERSION N/A 01/10/2017   Procedure: CARDIOVERSION;  Surgeon: Dorothy Spark, MD;  Location[facility] END[facility]COPY;  Service: Cardiovascular;  Laterality: N/A;  . CHOLECYSTECTOMY    . EXCISION OF ATRIAL MYXOMA Left 12/28/2016   Procedure: EXCISION  OF LEFT ATRIAL MYXOMA;  Surgeon: Ivin Poot, MD;  Location: [place];  Service: Open Heart Surgery;  Laterality: Left;  . FOOT SURGERY    . GASTRIC BYPASS    . INTRAOPERATIVE TRANSESOPHAGEAL ECHOCARDIOGRAM N/A 12/28/2016   Procedure: INTRAOPERATIVE TRANSESOPHAGEAL ECHOCARDIOGRAM;  Surgeon: Ivin Poot, MD;  Location: [place];  Service: Open Heart Surgery;  Laterality: N/A;  . TEE  WITHOUT CARDIOVERSION N/A 01/10/2017   Procedure: TRANSESOPHAGEAL ECHOCARDIOGRAM (TEE);  Surgeon: Dorothy Spark, MD;  Location: [facility] ENDOSCOPY;  Service: Cardiovascular;  Laterality: N/A;  . TEE WITHOUT CARDIOVERSION N/A 05/17/2017   Procedure: TRANSESOPHAGEAL ECHOCARDIOGRAM (TEE);  Surgeon: Josue Hector, MD;  Location: [facility] ENDOSCOPY;  Service: Cardiovascular;  Laterality: N/A;  . TOTAL KNEE ARTHROPLASTY    . TUBAL LIGATION       OB History   No obstetric history on file.     Family History  Problem Relation Age of Onset  . Thyroid cancer Mother   . Endometrial cancer Sister 75  . Breast cancer Maternal Aunt        dx > 50  . Breast cancer Maternal Grandmother        dx >50  . Breast cancer Maternal Aunt        dx  >50  . Depression Other        FMHx  . Stroke Paternal Grandfather   . Migraines Neg Hx     Social History   Tobacco Use  . Smoking status: Former Smoker    Packs/day: 0.25    Years: 10.00    Pack years: 2.50    Types: Cigarettes    Quit date: 1997    Years since quitting: 25.3  . Smokeless tobacco: Never Used  Vaping Use  . Vaping Use: Never used  Substance Use Topics  . Alcohol use: Yes    Alcohol/week: 3.0 - 5.0 standard drinks    Types: 3 - 5 Glasses of wine per week  . Drug use: No    Home Medications Prior to Admission medications   Medication Sig Start Date End Date Taking? Authorizing Provider  acetaZOLAMIDE (DIAMOX) 250 MG tablet Take 2 tablets (500 mg total) by mouth 2 (two) times daily. Patient not taking: Reported on 04/15/2020 05/22/19   Melvenia Beam, MD  AJOVY 225 MG/1.5ML SOAJ Inject 225 mg into the skin every 30 (thirty) days. 03/17/20   [provider]  amitriptyline (ELAVIL) 25 MG tablet Take 1-2 tablets (25-50 mg total) by mouth at bedtime. Patient not taking: Reported on 04/15/2020 04/03/19   Melvenia Beam, MD  Cholecalciferol (VITAMIN D3) 125 MCG (5000 UT) CAPS Take 5,000 Units by mouth daily.    [provider]  diltiazem (CARDIZEM) 30 MG tablet Take 1 tablet (30 mg total) every 6 (six) hours as needed by mouth. For fast heart rate. Continue long acting diltiazem, total of no more than 360 mg per day 01/29/17 01/29/18  Buford Dresser, MD  [facility] 0.05 MG/24HR RING Place 0.05 mg vaginally every 3 (three) months. 11/21/19   [provider]  flecainide (TAMBOCOR) 50 MG tablet Take 1 tablet (50 mg total) by mouth 2 (two) times daily. Please call our office to schedule an overdue appointment with Dr. Johnsie Cancel before anymore refills. Thank you 2nd attempt 06/19/20   Josue Hector, MD  furosemide (LASIX) 40 MG tablet As needed for weight gain 04/28/17   End, Harrell Gave, MD  HYDROcodone-acetaminophen [facility]) 10-325 MG tablet Take 1 tablet by  mouth 2 (two) times daily as needed for moderate pain. 03/31/19   [provider]  HYDROcodone-acetaminophen (NORCO/VICODIN) 5-325 MG tablet Take 1 tablet by mouth every 6 (six) hours as needed for severe pain. 04/15/20   Long, Wonda Olds, MD  lidocaine (XYLOCAINE) 5 % ointment Apply 1 application topically 3 (three) times daily as needed for moderate pain. 04/15/20   Long, Wonda Olds, MD  methylPREDNISolone (MEDROL DOSEPAK) 4 MG TBPK tablet follow package directions Patient not taking: Reported on 04/15/2020 05/10/19   Melvenia Beam, MD  metoprolol tartrate (LOPRESSOR) 25 MG tablet Take 1.5 tablets (37.5 mg total) by mouth 2 (two) times daily. Please make overdue appt with Dr. Johnsie Cancel before anymore refills. Thank you 2nd attempt 06/19/20   Josue Hector, MD  Multiple Vitamin (MULTIVITAMIN) tablet Take 1 tablet by mouth 2 (two) times daily.    [provider]  NURTEC 75 MG TBDP Take 75 mg by mouth every other day. 03/07/20   [provider]  potassium chloride SA (K-DUR,KLOR-CON) 20 MEQ tablet As needed for weight gain with Lasix 04/28/17   End, Harrell Gave, MD  pramipexole (MIRAPEX) 1 MG tablet Take 1 mg by mouth 2 (two) times  daily.     [provider]  promethazine (PHENERGAN) 12.5 MG tablet Take 12.5-25 mg by mouth 3 (three) times daily as needed for nausea/vomiting. 12/17/16   [provider]  rivaroxaban (XARELTO) 20 MG TABS tablet Take 1 tablet (20 mg total) by mouth daily. 05/16/20   Josue Hector, MD  rizatriptan (MAXALT-MLT) 10 MG disintegrating tablet Take 1 tablet (10 mg total) by mouth as needed for migraine. May repeat in 2 hours if needed Patient not taking: Reported on 04/15/2020 04/17/19   Melvenia Beam, MD  tiZANidine (ZANAFLEX) 2 MG tablet Take 2 mg by mouth every 6 (six) hours as needed (headache).  10/11/15   [provider]    Allergies    Other  Review of Systems   Review of Systems  All other systems reviewed and are negative.   Physical Exam Updated Vital Signs BP 125/79   Pulse 65   Temp 98.3 F (36.8 C) (Oral)   Resp 18   SpO2 100%   Physical Exam Vitals and nursing note reviewed.  Constitutional:      General: She is not in acute distress.    Appearance: She is well-developed.  HENT:     Head: Normocephalic and atraumatic.     Right Ear: External ear normal.     Left Ear: External ear normal.  Eyes:     General: No scleral icterus.       Right eye: No discharge.        Left eye: No discharge.     Conjunctiva/sclera: Conjunctivae normal.  Neck:     Trachea: No tracheal deviation.  Cardiovascular:     Rate and Rhythm: Normal rate and regular rhythm.  Pulmonary:     Effort: Pulmonary effort is normal. No respiratory distress.     Breath sounds: Normal breath sounds. No stridor. No wheezing or rales.  Abdominal:     General: Bowel sounds are normal. There is no distension.     Palpations: Abdomen is soft.     Tenderness: There is no abdominal tenderness. There is no guarding or rebound.  Musculoskeletal:        General: No tenderness.     Cervical back: Neck supple.  Skin:    General: Skin is warm and  dry.     Findings: No rash.   Neurological:     Mental Status: She is alert.     Cranial Nerves: No cranial nerve deficit (no facial droop, extraocular movements intact, no slurred speech).     Sensory: No sensory deficit.     Motor: No abnormal muscle tone or seizure activity.     Coordination: Coordination normal.     ED Results / Procedures / Treatments   Labs (all labs ordered are listed, but only abnormal results are displayed) Labs Reviewed - No data to display  EKG None  Radiology No results found.  Procedures Procedures   Medications Ordered in ED Medications  sodium chloride 0.9 % bolus 500 mL (0 mLs Intravenous Stopped 07/10/20 1320)    Followed by  0.9 %  sodium chloride infusion (1,000 mLs Intravenous New Bag/Given 07/10/20 1251)  ketorolac (TORADOL) 30 MG/ML injection 30 mg (30 mg Intravenous Given 07/10/20 1248)  diphenhydrAMINE (BENADRYL) injection 12.5 mg (12.5 mg Intravenous Given 07/10/20 1247)  metoCLOPramide (REGLAN) injection 10 mg (10 mg Intravenous Given 07/10/20 1247)    ED Course  I have reviewed the triage vital signs and the nursing notes.  Pertinent labs & imaging results that were available during my care of the patient were reviewed by me and considered in my medical decision making (see chart for details).  Clinical Course as of 07/10/20 1406  Thu Jul 10, 2020  1404 Patient's headache is much better.  Patient states she feels so much better now and is ready to go home [JK]    Clinical Course User Index [JK] Dorie Rank, MD   MDM Rules/Calculators/A&P                          Patient presented with complaints of persistent migraine headache.  Patient has history of same.  No findings to suggest infection, cerebral hemorrhage.  Patient was treated with migraine cocktail.  Symptoms have improved significantly.  Stable for discharge Final Clinical Impression(s) / ED Diagnoses Final diagnoses:  Migraine with status migrainosus, not intractable, unspecified migraine type     Rx / DC Orders ED Discharge Orders    None       Dorie Rank, MD 07/10/20 1407

## 2020-07-15 ENCOUNTER — Other Ambulatory Visit: Payer: Self-pay

## 2020-07-15 DIAGNOSIS — I480 Paroxysmal atrial fibrillation: Secondary | ICD-10-CM

## 2020-07-15 MED ORDER — FLECAINIDE ACETATE 50 MG PO TABS: 50.0000 mg | ORAL_TABLET | Freq: Two times a day (BID) | ORAL | 0 refills | Status: DC

## 2020-07-15 MED ORDER — METOPROLOL TARTRATE 25 MG PO TABS: 37.5000 mg | ORAL_TABLET | Freq: Two times a day (BID) | ORAL | 0 refills | Status: DC

## 2020-09-27 NOTE — Progress Notes (Signed)
Cardiology Office Note:    Date:  10/06/2020   ID:  Melissa [name], DOB 03/04/1966, MRN [id number]  PCP:  Maurice Small, MD  Cardiologist:  Jenkins Rouge, MD   Electrophysiologist:  None   Referring MD: Maurice Small, MD   No chief complaint on file.    History of Present Illness:    Melissa [name] is a 55 y.o. female   She underewent resection of the atrial myxoma with atrial septal pericardial patch in 79/0240 complicated by post op atrial fibrillation.  She has been continued on anticoagulation with Rivaroxaban.  PASP has returned to normal since her resection.   She has an apple watch and has had some bouts of PAF and PAC;s since surgery     Lastt monitor done 01/27/18 showed no PAF. 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 Melissa [name]  at [employer]  Having some cervical neck issues headaches with previous LP normal opening pressures  Seen in ED 07/10/20 with migraine Seeing neurology at [place] with migraine cocktail improved   She is taking estrogen which really helps her menopausal symptoms Since she is on anticoagulation risk of stroke should be less  Had nice beach trip with family to [place] Discussed compliance with cardiac meds which is lacking at times    Prior CV studies:   The following studies were reviewed today:  TEE 05/17/17 EF 55-60, trivial MR, no residual atrial myxoma, BAE, small PFO, pericardial septal patch intact, bubble study negative  48 Hr Holter 02/08/17 Sinus rhythm with rare PACs and PVCs. Single 4 beat atrial run. No atrial fibrillation or other sustained arrhythmia.  Echo 02/07/17 EF 55-60, no RWMA, mild LAE, neg bubble study, mild TR, normal PASP  TEE 01/10/17 EF 55-60, no RWMA, trivial AI, mild MR, severe BAE, severe TR, PASP 48, interatrial patch  significantly thickened with possible dehiscence and thrombus  R/L heart cath 12/27/16 LM normal LAD min irregs LCx ost 10 RCA normal  RA (mean): 11 mmHg RV (S/EDP): 71/13 mmHg PA (S/D, mean): 68/33 (47) mmHg PCWP (mean): 27 mmHg Ao sat: 99% PA sat: 73% Fick CO: 7.9 L/min Fick CI: 3.7 L/min/m^2 PVR: 2.5 Wood units Conclusions: Minimal atherosclerotic plaquing; no angiographically significant coronary artery disease. Hypervascular, mobile structure supplied predominantly by the right coronary artery, consistent with left atrial mass seen on recent echo. Moderately elevated left and right heart filling pressures. Severe pulmonary hypertension. Normal Fick cardiac output/index.  PreCABG Dopplers 12/27/16 1. Carotid Doppler Evaluation - findings are suggestive of 1-39%    diameter reduction bilaterally. There is moderate tortuosity of    the left internal carotid artery noted.  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   Depression    Family history of breast cancer    Family history of thyroid cancer    Family history of uterine cancer    Migraine  Osteoporosis    Pulmonary hypertension (HCC)    RLS (restless legs syndrome)    Surgical Hx: The patient  has a past surgical history that includes Cholecystectomy; Tubal ligation; Abdominal hysterectomy; Foot surgery; Total knee arthroplasty; Gastric bypass; Excision of atrial myxoma (Left, 12/28/2016); Intraoprative transesophageal echocardiogram (N/A, 12/28/2016); TEE without cardioversion (N/A, 01/10/2017); Cardioversion (N/A, 01/10/2017); and TEE without cardioversion (N/A, 05/17/2017).   Current  Medications: Current Meds  Medication Sig   albuterol (VENTOLIN HFA) 108 (90 Base) MCG/ACT inhaler Inhale into the lungs.   amitriptyline (ELAVIL) 25 MG tablet Take 1-2 tablets (25-50 mg total) by mouth at bedtime.   Cholecalciferol (VITAMIN D3) 125 MCG (5000 UT) CAPS Take 5,000 Units by mouth daily.   FEMRING 0.05 MG/24HR RING Place 0.05 mg vaginally every 3 (three) months.   flecainide (TAMBOCOR) 50 MG tablet Take 1 tablet (50 mg total) by mouth 2 (two) times daily. Please keep upcoming appointment with Dr. Johnsie Cancel in July 2022 before anymore refills. Thank you Final attempt   furosemide (LASIX) 40 MG tablet As needed for weight gain   HYDROcodone-acetaminophen (NORCO) 10-325 MG tablet Take 1 tablet by mouth 2 (two) times daily as needed for moderate pain.   metoprolol tartrate (LOPRESSOR) 25 MG tablet Take 1.5 tablets (37.5 mg total) by mouth 2 (two) times daily. Please keep upcoming appt with Dr. Johnsie Cancel in July 2022 before anymore refills. Thank you Final Attempt   Multiple Vitamin (MULTIVITAMIN) tablet Take 1 tablet by mouth 2 (two) times daily.   potassium chloride SA (K-DUR,KLOR-CON) 20 MEQ tablet As needed for weight gain with Lasix   pramipexole (MIRAPEX) 1 MG tablet Take 1 mg by mouth 2 (two) times daily.    promethazine (PHENERGAN) 12.5 MG tablet Take 12.5-25 mg by mouth 3 (three) times daily as needed for nausea/vomiting.   rivaroxaban (XARELTO) 20 MG TABS tablet Take 1 tablet (20 mg total) by mouth daily.   tiZANidine (ZANAFLEX) 2 MG tablet Take 2 mg by mouth every 6 (six) hours as needed (headache).    topiramate (TOPAMAX) 25 MG tablet Take by mouth.   TRUDHESA 0.725 MG/ACT AERS SMARTSIG:1 Spray(s) Both Nares 1-2 Times Daily     Allergies:   Other   Social History   Tobacco Use   Smoking status: Former    Packs/day: 0.25    Years: 10.00    Pack years: 2.50    Types: Cigarettes    Quit date: 1997    Years since quitting: 25.5   Smokeless tobacco: Never  Vaping Use    Vaping Use: Never used  Substance Use Topics   Alcohol use: Yes    Alcohol/week: 3.0 - 5.0 standard drinks    Types: 3 - 5 Glasses of wine per week   Drug use: No     Family Hx: The patient's family history includes Breast cancer in her maternal aunt, maternal aunt, and maternal grandmother; Depression in an other family member; Endometrial cancer (age of onset: 30) in her sister; Stroke in her paternal grandfather; Thyroid cancer in her mother. There is no history of Migraines.  ROS:   Please see the history of present illness.    Review of Systems  Constitutional: Positive for diaphoresis.  Cardiovascular:  Positive for irregular heartbeat.  Musculoskeletal:  Positive for back pain and myalgias.  Neurological:  Positive for headaches.  All other systems reviewed and are negative.   EKGs/Labs/Other Test Reviewed:    EKG:   10/06/2020 NSR rate 63 nonspecific ST changes  Recent Labs: 04/14/2020: BUN 18; Creatinine, Ser 0.53; Hemoglobin 13.7; Platelets 277; Potassium 3.8; Sodium 137   Recent Lipid Panel Lab Results  Component Value Date/Time   CHOL 145 06/21/2012 04:05 PM   TRIG 36.0 06/21/2012 04:05 PM   HDL 64.60 06/21/2012 04:05 PM   CHOLHDL 2 06/21/2012 04:05 PM   LDLCALC 73 06/21/2012 04:05 PM   LDLDIRECT 111.7 07/08/2008 08:39 AM    Physical Exam:    VS:  BP 122/68   Pulse 63   Ht 5\' 6"  (1.676 m)   Wt 105.7 kg   SpO2 99%   BMI 37.61 kg/m     Wt Readings from Last 3 Encounters:  10/06/20 105.7 kg  05/04/19 100.8 kg  04/18/19 98.7 kg     Affect appropriate Overweight white female  HEENT: normal Neck supple with no adenopathy JVP normal no bruits no thyromegaly Lungs clear with no wheezing and good diaphragmatic motion Heart:  S1/S2  1/6/ SEM  murmur, no rub, gallop or click PMI normal post sternotomy  Abdomen: benighn, BS positve, no tenderness, no AAA no bruit.  No HSM or HJR Distal pulses intact with no bruits No edema Neuro non-focal Skin warm  and dry No muscular weakness   ASSESSMENT & PLAN:    Paroxysmal atrial fibrillation (HCC) Continue flecainide, beta blocker seems improved On xarelto for anticoagulation   History of atrial myxoma S/p resection.  Transesophageal echocardiogram in 04/2017 demonstrated intact pericardial septal patch and no residual myxoma.  Chronic heart failure with preserved ejection fraction (HCC) Overall, volume status stable.  Lasix as needed  .    Headaches:  F/u with neuro/primary Post LP and cervical spine films not very remarkable  Migraines f/u [facility] neurology    Dispo:  F/U in a year   Jenkins Rouge

## 2020-10-06 ENCOUNTER — Encounter: Payer: Self-pay | Admitting: Cardiovascular Disease

## 2020-10-06 ENCOUNTER — Ambulatory Visit (INDEPENDENT_AMBULATORY_CARE_PROVIDER_SITE_OTHER): Payer: 59 | Admitting: Cardiovascular Disease

## 2020-10-06 ENCOUNTER — Other Ambulatory Visit: Payer: Self-pay

## 2020-10-06 VITALS — BP 122/68 | HR 63 | Ht 66.0 in | Wt 233.0 lb

## 2020-10-06 DIAGNOSIS — I480 Paroxysmal atrial fibrillation: Secondary | ICD-10-CM | POA: Diagnosis not present

## 2020-10-06 DIAGNOSIS — G43109 Migraine with aura, not intractable, without status migrainosus: Secondary | ICD-10-CM

## 2020-10-06 DIAGNOSIS — D151 Benign neoplasm of heart: Secondary | ICD-10-CM

## 2020-10-06 NOTE — Patient Instructions (Signed)
Medication Instructions:  Your physician recommends that you continue on your current medications as directed. Please refer to the Current Medication list given to you today.  *If you need a refill on your cardiac medications before your next appointment, please call your pharmacy*  Lab Work: If you have labs (blood work) drawn today and your tests are completely normal, you will receive your results only by: [place] (if you have MyChart) OR A paper copy in the mail If you have any lab test that is abnormal or we need to change your treatment, we will call you to review the results.  Testing/Procedures: None ordered today.  Follow-Up: At [facility], you and your health needs are our priority.  As part of our continuing mission to provide you with exceptional heart care, we have created designated Provider Care Teams.  These Care Teams include your primary Cardiologist (physician) and Advanced Practice Providers (APPs -  Physician Assistants and Nurse Practitioners) who all work together to provide you with the care you need, when you need it.  We recommend signing up for the patient portal called "MyChart".  Sign up information is provided on this After Visit Summary.  MyChart is used to connect with patients for Virtual Visits (Telemedicine).  Patients are able to view lab/test results, encounter notes, upcoming appointments, etc.  Non-urgent messages can be sent to your provider as well.   To learn more about what you can do with MyChart, go to [email or web address].    Your next appointment:   12 month(s)  The format for your next appointment:   In Person  Provider:   You may see Jenkins Rouge, MD or one of the following Advanced Practice Providers on your designated Care Team:   Cecilie Kicks, NP

## 2020-12-03 ENCOUNTER — Other Ambulatory Visit: Payer: Self-pay

## 2020-12-03 DIAGNOSIS — I480 Paroxysmal atrial fibrillation: Secondary | ICD-10-CM

## 2020-12-03 MED ORDER — METOPROLOL TARTRATE 25 MG PO TABS: 37.5000 mg | ORAL_TABLET | Freq: Two times a day (BID) | ORAL | 3 refills | Status: DC

## 2020-12-03 MED ORDER — FLECAINIDE ACETATE 50 MG PO TABS
50.0000 mg | ORAL_TABLET | Freq: Two times a day (BID) | ORAL | 3 refills | Status: DC
Start: 2020-12-03 — End: 2021-11-24

## 2021-01-22 ENCOUNTER — Other Ambulatory Visit: Payer: Self-pay | Admitting: Cardiovascular Disease

## 2021-01-22 NOTE — Telephone Encounter (Signed)
Prescription refill request for Xarelto received.   Indication: afib  Last office visit: Melissa [name] 10/06/2020 Weight: 105.7kg  Age: 55 yo  Scr: 0.53 04/14/2020 CrCl: 197/ml/min   Refill sent.

## 2021-05-22 ENCOUNTER — Encounter (HOSPITAL_BASED_OUTPATIENT_CLINIC_OR_DEPARTMENT_OTHER): Payer: Self-pay | Admitting: Emergency Medicine

## 2021-05-22 ENCOUNTER — Emergency Department (HOSPITAL_BASED_OUTPATIENT_CLINIC_OR_DEPARTMENT_OTHER): Payer: 59 | Admitting: Radiology

## 2021-05-22 ENCOUNTER — Other Ambulatory Visit: Payer: Self-pay

## 2021-05-22 ENCOUNTER — Emergency Department (HOSPITAL_BASED_OUTPATIENT_CLINIC_OR_DEPARTMENT_OTHER)
Admission: EM | Admit: 2021-05-22 | Discharge: 2021-05-22 | Disposition: A | Discharge status: Home / Self Care | Payer: 59 | Attending: Emergency Medicine | Admitting: Emergency Medicine

## 2021-05-22 DIAGNOSIS — R079 Chest pain, unspecified: Secondary | ICD-10-CM | POA: Insufficient documentation

## 2021-05-22 DIAGNOSIS — Z7901 Long term (current) use of anticoagulants: Secondary | ICD-10-CM | POA: Diagnosis not present

## 2021-05-22 DIAGNOSIS — Z79899 Other long term (current) drug therapy: Secondary | ICD-10-CM | POA: Insufficient documentation

## 2021-05-22 LAB — CBC
HCT: 38.3 % (ref 36.0–46.0)
Hemoglobin: 12.5 g/dL (ref 12.0–15.0)
MCH: 28.2 pg (ref 26.0–34.0)
MCHC: 32.6 g/dL (ref 30.0–36.0)
MCV: 86.3 fL (ref 80.0–100.0)
Platelets: 305 10*3/uL (ref 150–400)
RBC: 4.44 MIL/uL (ref 3.87–5.11)
RDW: 13.7 % (ref 11.5–15.5)
WBC: 5.7 10*3/uL (ref 4.0–10.5)
nRBC: 0 % (ref 0.0–0.2)

## 2021-05-22 LAB — BASIC METABOLIC PANEL
Anion gap: 9 (ref 5–15)
BUN: 5 mg/dL — ABNORMAL LOW (ref 6–20)
CO2: 27 mmol/L (ref 22–32)
Calcium: 9.3 mg/dL (ref 8.9–10.3)
Chloride: 105 mmol/L (ref 98–111)
Creatinine, Ser: 0.59 mg/dL (ref 0.44–1.00)
GFR, Estimated: >60 mL/min (ref >60)
Glucose, Bld: 88 mg/dL (ref 70–99)
Potassium: 3.6 mmol/L (ref 3.5–5.1)
Sodium: 141 mmol/L (ref 135–145)

## 2021-05-22 LAB — TROPONIN I (HIGH SENSITIVITY)
Troponin I (High Sensitivity): 3 ng/L (ref <18)
Troponin I (High Sensitivity): 3 ng/L (ref <18)

## 2021-05-22 NOTE — ED Provider Notes (Signed)
[facility] EMERGENCY DEPT Provider Note   CSN: [id number] Arrival date & time: 05/22/21  9381     History  Chief Complaint  Patient presents with   Chest Pain    Melissa [name] is a 56 y.o. female.  Patient presents chief complaint of midline chest pressure palpitation type sensation.  Has been off and on for the past couple days.  She states that nothing really seems to bring it on and nothing seems to cause it.  Does not radiate elsewhere.  Last may be 3 to 4 minutes at a time.  Describes it is not exertional.  Currently has no symptoms but had 1 earlier today and presented to the ER.      Home Medications Prior to Admission medications   Medication Sig Start Date End Date Taking? Authorizing Provider  acetaZOLAMIDE (DIAMOX) 250 MG tablet Take 2 tablets (500 mg total) by mouth 2 (two) times daily. Patient not taking: Reported on 10/06/2020 05/22/19   Melvenia Beam, MD  AJOVY 225 MG/1.5ML SOAJ Inject 225 mg into the skin every 30 (thirty) days. Patient not taking: Reported on 10/06/2020 03/17/20   [provider]  albuterol (VENTOLIN HFA) 108 (90 Base) MCG/ACT inhaler Inhale into the lungs. 07/17/20   [provider]  amitriptyline (ELAVIL) 25 MG tablet Take 1-2 tablets (25-50 mg total) by mouth at bedtime. 04/03/19   Melvenia Beam, MD  Cholecalciferol (VITAMIN D3) 125 MCG (5000 UT) CAPS Take 5,000 Units by mouth daily.    [provider]  diltiazem (CARDIZEM) 30 MG tablet Take 1 tablet (30 mg total) every 6 (six) hours as needed by mouth. For fast heart rate. Continue long acting diltiazem, total of no more than 360 mg per day 01/29/17 01/29/18  Buford Dresser, MD  [facility] 0.05 MG/24HR RING Place 0.05 mg vaginally every 3 (three) months. 11/21/19   [provider]  flecainide (TAMBOCOR) 50 MG tablet Take 1 tablet (50 mg total) by mouth 2 (two) times daily. 12/03/20   Josue Hector, MD  furosemide (LASIX) 40 MG tablet As  needed for weight gain 04/28/17   End, Harrell Gave, MD  HYDROcodone-acetaminophen (NORCO) 10-325 MG tablet Take 1 tablet by mouth 2 (two) times daily as needed for moderate pain. 03/31/19   [provider]  HYDROcodone-acetaminophen (NORCO/VICODIN) 5-325 MG tablet Take 1 tablet by mouth every 6 (six) hours as needed for severe pain. Patient not taking: Reported on 10/06/2020 04/15/20   Long, Wonda Olds, MD  lidocaine (XYLOCAINE) 5 % ointment Apply 1 application topically 3 (three) times daily as needed for moderate pain. Patient not taking: Reported on 10/06/2020 04/15/20   Long, Wonda Olds, MD  methylPREDNISolone (MEDROL DOSEPAK) 4 MG TBPK tablet follow package directions Patient not taking: Reported on 10/06/2020 05/10/19   Melvenia Beam, MD  metoprolol tartrate (LOPRESSOR) 25 MG tablet Take 1.5 tablets (37.5 mg total) by mouth 2 (two) times daily. 12/03/20   Josue Hector, MD  Multiple Vitamin (MULTIVITAMIN) tablet Take 1 tablet by mouth 2 (two) times daily.    [provider]  NURTEC 75 MG TBDP Take 75 mg by mouth every other day. Patient not taking: Reported on 10/06/2020 03/07/20   [provider]  potassium chloride SA (K-DUR,KLOR-CON) 20 MEQ tablet As needed for weight gain with Lasix 04/28/17   End, Harrell Gave, MD  pramipexole (MIRAPEX) 1 MG tablet Take 1 mg by mouth 2 (two) times daily.     [provider]  promethazine (  PHENERGAN) 12.5 MG tablet Take 12.5-25 mg by mouth 3 (three) times daily as needed for nausea/vomiting. 12/17/16   [provider]  rizatriptan (MAXALT-MLT) 10 MG disintegrating tablet Take 1 tablet (10 mg total) by mouth as needed for migraine. May repeat in 2 hours if needed Patient not taking: Reported on 10/06/2020 04/17/19   Melvenia Beam, MD  tiZANidine (ZANAFLEX) 2 MG tablet Take 2 mg by mouth every 6 (six) hours as needed (headache).  10/11/15   [provider]  topiramate (TOPAMAX) 25 MG tablet Take by mouth. 09/03/20    [provider]  TRUDHESA 0.725 MG/ACT AERS SMARTSIG:1 Spray(s) Both Nares 1-2 Times Daily 08/15/20   [provider]  XARELTO 20 MG TABS tablet Take 1 tablet by mouth daily. 01/22/21   Josue Hector, MD      Allergies    Other    Review of Systems   Review of Systems  Constitutional:  Negative for fever.  HENT:  Negative for ear pain.   Eyes:  Negative for pain.  Respiratory:  Negative for cough.   Cardiovascular:  Positive for chest pain.  Gastrointestinal:  Negative for abdominal pain.  Genitourinary:  Negative for flank pain.  Musculoskeletal:  Negative for back pain.  Skin:  Negative for rash.  Neurological:  Negative for headaches.   Physical Exam Updated Vital Signs BP 137/81    Pulse 66    Temp 98.2 F (36.8 C)    Resp 13    Wt 108.9 kg    SpO2 100%    BMI 38.74 kg/m  Physical Exam Constitutional:      General: She is not in acute distress.    Appearance: Normal appearance.  HENT:     Head: Normocephalic.     Nose: Nose normal.  Eyes:     Extraocular Movements: Extraocular movements intact.  Cardiovascular:     Rate and Rhythm: Normal rate.  Pulmonary:     Effort: Pulmonary effort is normal.  Musculoskeletal:        General: Normal range of motion.     Cervical back: Normal range of motion.  Neurological:     General: No focal deficit present.     Mental Status: She is alert. Mental status is at baseline.    ED Results / Procedures / Treatments   Labs (all labs ordered are listed, but only abnormal results are displayed) Labs Reviewed  BASIC METABOLIC PANEL - Abnormal; Notable for the following components:      Result Value   BUN 5 (*)    All other components within normal limits  CBC  TROPONIN I (HIGH SENSITIVITY)  TROPONIN I (HIGH SENSITIVITY)    EKG EKG Interpretation  Date/Time:  Friday May 22 2021 10:02:49 EST Ventricular Rate:  74 PR Interval:  160 QRS Duration: 84 QT Interval:  410 QTC Calculation: 455 R  Axis:   50 Text Interpretation: Normal sinus rhythm Normal ECG When compared with ECG of 10-Jan-2017 14:27, Nonspecific T wave abnormality no longer evident in Inferior leads Nonspecific T wave abnormality no longer evident in Lateral leads Confirmed by Thamas Jaegers ([id number]) on 05/22/2021 1:58:38 PM  Radiology DG Chest 2 View  Result Date: 05/22/2021 CLINICAL DATA:  Chest pain EXAM: CHEST - 2 VIEW COMPARISON:  Chest x-ray 04/14/2020 FINDINGS: Heart size and mediastinal contours are within normal limits. Median sternotomy wires. No suspicious pulmonary opacities identified. No pleural effusion or pneumothorax visualized. No acute osseous abnormality appreciated. IMPRESSION: No acute intrathoracic  process identified. Electronically Signed   By: Ofilia Neas M.D.   On: 05/22/2021 11:05    Procedures Procedures    Medications Ordered in ED Medications - No data to display  ED Course/ Medical Decision Making/ A&P                           Medical Decision Making Prior comorbid conditions include congestive heart failure, pulmonary hypertension, prior cardiac disease.  Amount and/or Complexity of Data Reviewed Independent Historian: parent External Data Reviewed: notes.    Details: Seen in clinic a few days ago for back pain. Labs: ordered.    Details: CBC normal chemistry normal troponin negative x2. Radiology: ordered.    Details: Chest x-ray unremarkable no infiltrate effusion or edema. ECG/medicine tests: ordered and independent interpretation performed. Decision-making details documented in ED Course.    Details: Normal rate, no ST elevations depressions no T wave inversions sinus rhythm.  Risk Risk Details: Patient presented with chief complaint of chest pain.  Differential includes acute coronary syndrome, pulm embolism, pneumothorax, aortic pathology, versus other.   Given negative work-up and continued asymptomatic presentation, she safe to be discharged home.  Advised  outpatient follow-up with cardiology within the week.  Recommend avoidance of strenuous activity until cleared by cardiology, recommend immediate return for worsening symptoms or any additional concerns.         Final Clinical Impression(s) / ED Diagnoses Final diagnoses:  Chest pain, unspecified type    Rx / DC Orders ED Discharge Orders     None         Luna Fuse, MD 05/22/21 1404

## 2021-05-22 NOTE — Discharge Instructions (Addendum)
Follow-up with your cardiologist within the week. ?Avoid strenuous activity until cleared by cardiology. ? ?Return immediately back to the ER if: ? ?Your symptoms worsen within the next 12-24 hours. ?You develop new symptoms such as new fevers, persistent vomiting, new pain, shortness of breath, or new weakness or numbness, or if you have any other concerns. ? ?

## 2021-05-22 NOTE — ED Triage Notes (Signed)
Central chest pressure , palpitation , intensive cardia history .  ?

## 2021-07-26 ENCOUNTER — Other Ambulatory Visit: Payer: Self-pay | Admitting: Cardiovascular Disease

## 2021-07-26 DIAGNOSIS — I480 Paroxysmal atrial fibrillation: Secondary | ICD-10-CM

## 2021-07-27 NOTE — Telephone Encounter (Signed)
Xarelto '20mg'$  refill request received. Pt is 56 years old, weight-108.9kg, Crea-0.59 on 05/22/2021, last seen by Dr. Johnsie Cancel on 10/06/2020, Diagnosis-Afib, CrCl-180.70m/min; Dose is appropriate based on dosing criteria. Will send in refill to requested pharmacy.    ?

## 2021-10-14 ENCOUNTER — Other Ambulatory Visit: Payer: Self-pay | Admitting: Neurosurgery

## 2021-10-14 DIAGNOSIS — M47816 Spondylosis without myelopathy or radiculopathy, lumbar region: Secondary | ICD-10-CM

## 2021-10-23 ENCOUNTER — Ambulatory Visit
Admission: RE | Admit: 2021-10-23 | Discharge: 2021-10-23 | Disposition: A | Discharge status: Home / Self Care | Payer: 59 | Source: Ambulatory Visit | Attending: Neurosurgery | Admitting: Neurosurgery

## 2021-10-23 DIAGNOSIS — M47816 Spondylosis without myelopathy or radiculopathy, lumbar region: Secondary | ICD-10-CM

## 2021-11-21 ENCOUNTER — Other Ambulatory Visit: Payer: Self-pay | Admitting: Cardiovascular Disease

## 2021-11-21 DIAGNOSIS — I480 Paroxysmal atrial fibrillation: Secondary | ICD-10-CM

## 2021-11-24 ENCOUNTER — Other Ambulatory Visit: Payer: Self-pay

## 2021-11-24 DIAGNOSIS — I480 Paroxysmal atrial fibrillation: Secondary | ICD-10-CM

## 2021-11-24 MED ORDER — METOPROLOL TARTRATE 25 MG PO TABS: 37.5000 mg | ORAL_TABLET | Freq: Two times a day (BID) | ORAL | 0 refills | Status: DC

## 2021-11-24 MED ORDER — FLECAINIDE ACETATE 50 MG PO TABS: 50.0000 mg | ORAL_TABLET | Freq: Two times a day (BID) | ORAL | 0 refills | Status: DC

## 2021-11-24 NOTE — Addendum Note (Signed)
Addended by: Carter Kitten D on: 11/24/2021 11:24 AM   Modules accepted: Orders

## 2021-12-03 ENCOUNTER — Encounter: Payer: Self-pay | Admitting: Cardiovascular Disease

## 2021-12-03 ENCOUNTER — Other Ambulatory Visit: Payer: Self-pay | Admitting: Cardiovascular Disease

## 2021-12-03 DIAGNOSIS — I480 Paroxysmal atrial fibrillation: Secondary | ICD-10-CM

## 2021-12-03 MED ORDER — FLECAINIDE ACETATE 50 MG PO TABS: 50.0000 mg | ORAL_TABLET | Freq: Two times a day (BID) | ORAL | 0 refills | Status: DC

## 2021-12-03 MED ORDER — METOPROLOL TARTRATE 25 MG PO TABS: 37.5000 mg | ORAL_TABLET | Freq: Two times a day (BID) | ORAL | 0 refills | Status: DC

## 2022-01-11 NOTE — Progress Notes (Unsigned)
Cardiology Office Note:    Date:  01/14/2022   ID:  Melissa [name], DOB 1966/01/13, MRN [id number]  PCP:  Maurice Small, MD  Cardiologist:  Jenkins Rouge, MD   Electrophysiologist:  None   Referring MD: Maurice Small, MD   No chief complaint on file.    History of Present Illness:    Melissa [name] is a 56 y.o. 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  She has been continued on anticoagulation with Rivaroxaban.  PASP has returned to normal since her resection.   She has an apple watch and has had some bouts of PAF and PAC;s since surgery     Lastt monitor done 01/27/18 showed no PAF. TEE 05/17/17 with intact septal patch and no recurrent atrial myxoma  Limited palpitations only at night when she first lays down Has not had to take PRN cardizem   Son [name] transferred to [facility]  and now is in fire department  Younger son [name]  at [employer]  Having some cervical neck issues headaches with previous LP normal opening pressures  Seen in ED 07/10/20 with migraine Seeing neurology at [facility] with migraine cocktail improved   She is taking estrogen which really helps her menopausal symptoms Since she is on anticoagulation risk of stroke should be less  Had nice beach trip with family to [place] Discussed compliance with cardiac meds which is lacking at times   Seen in ED 05/22/21 for atypical chest pain R/O  Having lower back pain MRI 10/23/21 with small left foraminal protrusion At L5-S2 stable since 2017  Seeing Dr Junius Roads now for concierge medicine as he is more responsive Still doing IT for [employer] and Gerald Stabs husband doing finance work for [employer]    Prior CV studies:   The following studies were reviewed today:  TEE 05/17/17 EF 55-60, trivial MR, no residual atrial myxoma, BAE, small PFO, pericardial septal patch intact, bubble study negative  48 Hr Holter 02/08/17 Sinus rhythm  with rare PACs and PVCs. Single 4 beat atrial run. No atrial fibrillation or other sustained arrhythmia.  Echo 02/07/17 EF 55-60, no RWMA, mild LAE, neg bubble study, mild TR, normal PASP  TEE 01/10/17 EF 55-60, no RWMA, trivial AI, mild MR, severe BAE, severe TR, PASP 48, interatrial patch significantly thickened with possible dehiscence and thrombus  R/L heart cath 12/27/16 LM normal LAD min irregs LCx ost 10 RCA normal  RA (mean): 11 mmHg RV (S/EDP): 71/13 mmHg PA (S/D, mean): 68/33 (47) mmHg PCWP (mean): 27 mmHg Ao sat: 99% PA sat: 73% Fick CO: 7.9 L/min Fick CI: 3.7 L/min/m^2 PVR: 2.5 Wood units Conclusions: Minimal atherosclerotic plaquing; no angiographically significant coronary artery disease. Hypervascular, mobile structure supplied predominantly by the right coronary artery, consistent with left atrial mass seen on recent echo. Moderately elevated left and right heart filling pressures. Severe pulmonary hypertension. Normal Fick cardiac output/index.  PreCABG Dopplers 12/27/16 1. Carotid Doppler Evaluation - findings are suggestive of 1-39%    diameter reduction bilaterally. There is moderate tortuosity of    the left internal carotid artery noted.  Echo 12/24/16 EF 55-60, no RWMA, mod MR, MV gradients severely elevated due to L atrial mass (mean 20), severea LAE, larg (2.9 x 4.5 cm) mass, mod TR, PASP 89  Past Medical History:  Diagnosis Date   (HFpEF) heart failure with preserved ejection fraction ([place])    ADD (  attention deficit disorder)    Allergy    Arthritis    Asthma    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16   Depression    Family history of breast cancer    Family history of thyroid cancer    Family history of uterine cancer    Migraine    Osteoporosis    Pulmonary hypertension (HCC)    RLS (restless legs syndrome)    Surgical Hx: The patient  has a past surgical history that includes Cholecystectomy; Tubal ligation; Abdominal hysterectomy; Foot surgery; Total  knee arthroplasty; Gastric bypass; Excision of atrial myxoma (Left, 12/28/2016); Intraoprative transesophageal echocardiogram (N/A, 12/28/2016); TEE without cardioversion (N/A, 01/10/2017); Cardioversion (N/A, 01/10/2017); and TEE without cardioversion (N/A, 05/17/2017).   Current Medications: Current Meds  Medication Sig   AJOVY 225 MG/1.5ML SOAJ Inject 225 mg into the skin every 30 (thirty) days.   albuterol (VENTOLIN HFA) 108 (90 Base) MCG/ACT inhaler Inhale into the lungs.   FEMRING 0.05 MG/24HR RING Place 0.05 mg vaginally every 3 (three) months.   flecainide (TAMBOCOR) 50 MG tablet Take 1 tablet (50 mg total) by mouth 2 (two) times daily.   furosemide (LASIX) 40 MG tablet As needed for weight gain   HYDROcodone-acetaminophen (NORCO) 10-325 MG tablet Take 1 tablet by mouth 2 (two) times daily as needed for moderate pain.   methocarbamol (ROBAXIN) 500 MG tablet Take 500 mg by mouth 2 (two) times daily.   metoprolol tartrate (LOPRESSOR) 25 MG tablet Take 1.5 tablets (37.5 mg total) by mouth 2 (two) times daily.   Multiple Vitamin (MULTIVITAMIN) tablet Take 1 tablet by mouth 2 (two) times daily.   potassium chloride SA (K-DUR,KLOR-CON) 20 MEQ tablet As needed for weight gain with Lasix   promethazine (PHENERGAN) 12.5 MG tablet Take 12.5-25 mg by mouth 3 (three) times daily as needed for nausea/vomiting.   rivaroxaban (XARELTO) 20 MG TABS tablet Take 1 tablet by mouth daily.   triamcinolone cream (KENALOG) 0.1 % Apply topically as needed.     Allergies:   Other   Social History   Tobacco Use   Smoking status: Former    Packs/day: 0.25    Years: 10.00    Total pack years: 2.50    Types: Cigarettes    Quit date: 1997    Years since quitting: 26.8   Smokeless tobacco: Never  Vaping Use   Vaping Use: Never used  Substance Use Topics   Alcohol use: Yes    Alcohol/week: 3.0 - 5.0 standard drinks of alcohol    Types: 3 - 5 Glasses of wine per week   Drug use: No     Family Hx: The  patient's family history includes Breast cancer in her maternal aunt, maternal aunt, and maternal grandmother; Depression in an other family member; Endometrial cancer (age of onset: 57) in her sister; Stroke in her paternal grandfather; Thyroid cancer in her mother. There is no history of Migraines.  ROS:   Please see the history of present illness.    Review of Systems  Constitutional: Positive for diaphoresis.  Cardiovascular:  Positive for irregular heartbeat.  Musculoskeletal:  Positive for back pain and myalgias.  Neurological:  Positive for headaches.   All other systems reviewed and are negative.   EKGs/Labs/Other Test Reviewed:    EKG:   01/14/2022 NSR rate 63 nonspecific ST changes   Recent Labs: 05/22/2021: BUN 5; Creatinine, Ser 0.59; Hemoglobin 12.5; Platelets 305; Potassium 3.6; Sodium 141   Recent Lipid Panel Lab Results  Component Value Date/Time  CHOL 145 06/21/2012 04:05 PM   TRIG 36.0 06/21/2012 04:05 PM   HDL 64.60 06/21/2012 04:05 PM   CHOLHDL 2 06/21/2012 04:05 PM   LDLCALC 73 06/21/2012 04:05 PM   LDLDIRECT 111.7 07/08/2008 08:39 AM    Physical Exam:    VS:  BP (!) 130/90   Pulse 69   Ht '5\' 6"'$  (1.676 m)   Wt 257 lb 9.6 oz (116.8 kg)   SpO2 98%   BMI 41.58 kg/m     Wt Readings from Last 3 Encounters:  01/14/22 257 lb 9.6 oz (116.8 kg)  05/22/21 240 lb (108.9 kg)  10/06/20 233 lb (105.7 kg)     Affect appropriate Overweight white female  HEENT: normal Neck supple with no adenopathy JVP normal no bruits no thyromegaly Lungs clear with no wheezing and good diaphragmatic motion Heart:  S1/S2  1/6/ SEM  murmur, no rub, gallop or click PMI normal post sternotomy  Abdomen: benighn, BS positve, no tenderness, no AAA no bruit.  No HSM or HJR Distal pulses intact with no bruits No edema Neuro non-focal Skin warm and dry No muscular weakness   ASSESSMENT & PLAN:    Paroxysmal atrial fibrillation (HCC) Continue flecainide, beta blocker seems  improved On xarelto for anticoagulation She had no coronary disease on cath 12/27/16 prior  To myxoma surgery   History of atrial myxoma S/p resection.  Transesophageal echocardiogram in 04/2017 demonstrated intact pericardial septal patch and no residual myxoma. Will update TTE  Chronic heart failure with preserved ejection fraction (HCC) Overall, volume status stable.  Lasix as needed  .    Headaches:  F/u with neuro/primary Post LP and cervical spine films not very remarkable  Migraines f/u [facility] neurology    TTE    Dispo:  F/U in a year   Jenkins Rouge

## 2022-01-14 ENCOUNTER — Ambulatory Visit: Payer: 59 | Attending: Cardiovascular Disease | Admitting: Cardiovascular Disease

## 2022-01-14 ENCOUNTER — Encounter: Payer: Self-pay | Admitting: Cardiovascular Disease

## 2022-01-14 VITALS — BP 130/90 | HR 69 | Ht 66.0 in | Wt 257.6 lb

## 2022-01-14 DIAGNOSIS — I480 Paroxysmal atrial fibrillation: Secondary | ICD-10-CM

## 2022-01-14 DIAGNOSIS — D151 Benign neoplasm of heart: Secondary | ICD-10-CM | POA: Diagnosis not present

## 2022-01-14 DIAGNOSIS — G43109 Migraine with aura, not intractable, without status migrainosus: Secondary | ICD-10-CM

## 2022-01-14 NOTE — Patient Instructions (Signed)

## 2022-01-23 ENCOUNTER — Other Ambulatory Visit: Payer: Self-pay | Admitting: Cardiovascular Disease

## 2022-01-23 DIAGNOSIS — I480 Paroxysmal atrial fibrillation: Secondary | ICD-10-CM

## 2022-01-25 NOTE — Telephone Encounter (Signed)
Prescription refill request for Xarelto received.  Indication: Afib  Last office visit: 01/14/22 Johnsie Cancel)  Weight: 116.8kg Age: 56 Scr: 0.69 (05/22/21)  CrCl: 165.82m/min  Appropriate dose and refill sent to requested pharmacy.

## 2022-02-02 ENCOUNTER — Encounter: Payer: Self-pay | Admitting: Cardiovascular Disease

## 2022-02-02 ENCOUNTER — Other Ambulatory Visit: Payer: Self-pay

## 2022-02-02 DIAGNOSIS — I480 Paroxysmal atrial fibrillation: Secondary | ICD-10-CM

## 2022-02-02 MED ORDER — RIVAROXABAN 20 MG PO TABS: 20.0000 mg | ORAL_TABLET | Freq: Every day | ORAL | 1 refills | Status: DC

## 2022-02-02 NOTE — Telephone Encounter (Signed)
Prescription refill request for Xarelto received.  Indication:afib Last office visit:10/23 Weight:116.8 kg Age:56 Scr:0.5 CrCl:22809  ml/min  Prescription refilled

## 2022-02-20 ENCOUNTER — Other Ambulatory Visit: Payer: Self-pay | Admitting: Cardiovascular Disease

## 2022-02-20 DIAGNOSIS — I480 Paroxysmal atrial fibrillation: Secondary | ICD-10-CM

## 2022-02-22 ENCOUNTER — Other Ambulatory Visit: Payer: Self-pay | Admitting: *Deleted

## 2022-02-22 DIAGNOSIS — I480 Paroxysmal atrial fibrillation: Secondary | ICD-10-CM

## 2022-02-22 MED ORDER — METOPROLOL TARTRATE 25 MG PO TABS: 37.5000 mg | ORAL_TABLET | Freq: Two times a day (BID) | ORAL | 3 refills | Status: DC

## 2022-02-22 MED ORDER — FLECAINIDE ACETATE 50 MG PO TABS: 50.0000 mg | ORAL_TABLET | Freq: Two times a day (BID) | ORAL | 3 refills | Status: DC

## 2022-06-14 ENCOUNTER — Other Ambulatory Visit: Payer: Self-pay

## 2022-06-14 DIAGNOSIS — I480 Paroxysmal atrial fibrillation: Secondary | ICD-10-CM

## 2022-06-14 MED ORDER — RIVAROXABAN 20 MG PO TABS: 20.0000 mg | ORAL_TABLET | Freq: Every day | ORAL | 1 refills | Status: DC

## 2022-06-14 NOTE — Telephone Encounter (Signed)
Prescription refill request for Xarelto received.  Indication: Afib  Last office visit: 01/14/22 Johnsie Cancel)  Weight: 116.8kg Age: 57 Scr: 0.62 (04/27/22)  CrCl: 182.91ml/min  Appropriate dose. Refill sent.

## 2022-07-07 ENCOUNTER — Other Ambulatory Visit (HOSPITAL_COMMUNITY): Payer: Self-pay

## 2022-07-07 ENCOUNTER — Encounter: Payer: Self-pay | Admitting: Hematology and Oncology

## 2022-07-07 MED ORDER — ZEPBOUND 15 MG/0.5ML ~~LOC~~ SOAJ
15.0000 mg | SUBCUTANEOUS | 3 refills | Status: DC
  Filled 2022-07-07: qty 2, 28d supply, fill #0
  Filled 2022-07-20 – 2022-07-28 (×2): qty 2, 28d supply, fill #1
  Filled 2022-07-28: qty 2, 28d supply, fill #0
  Filled 2022-08-18: qty 2, 28d supply, fill #1
  Filled 2022-09-08: qty 2, 28d supply, fill #2
  Filled 2022-09-08 – 2022-09-09 (×2): qty 2, 28d supply, fill #0

## 2022-07-20 ENCOUNTER — Other Ambulatory Visit (HOSPITAL_COMMUNITY): Payer: Self-pay

## 2022-07-22 ENCOUNTER — Other Ambulatory Visit (HOSPITAL_COMMUNITY): Payer: Self-pay

## 2022-07-28 ENCOUNTER — Other Ambulatory Visit: Payer: Self-pay

## 2022-07-28 ENCOUNTER — Other Ambulatory Visit (HOSPITAL_COMMUNITY): Payer: Self-pay

## 2022-07-30 ENCOUNTER — Other Ambulatory Visit: Payer: Self-pay | Admitting: Family Medicine

## 2022-07-30 ENCOUNTER — Ambulatory Visit
Admission: RE | Admit: 2022-07-30 | Discharge: 2022-07-30 | Disposition: A | Discharge status: Home / Self Care | Payer: 59 | Source: Ambulatory Visit | Attending: Family Medicine | Admitting: Family Medicine

## 2022-07-30 DIAGNOSIS — M25551 Pain in right hip: Secondary | ICD-10-CM

## 2022-08-04 ENCOUNTER — Other Ambulatory Visit: Payer: Self-pay

## 2022-08-18 ENCOUNTER — Other Ambulatory Visit (HOSPITAL_COMMUNITY): Payer: Self-pay

## 2022-09-08 ENCOUNTER — Other Ambulatory Visit (HOSPITAL_COMMUNITY): Payer: Self-pay

## 2022-09-08 ENCOUNTER — Other Ambulatory Visit (HOSPITAL_BASED_OUTPATIENT_CLINIC_OR_DEPARTMENT_OTHER): Payer: Self-pay

## 2022-09-09 ENCOUNTER — Other Ambulatory Visit: Payer: Self-pay

## 2022-09-16 ENCOUNTER — Telehealth: Payer: Self-pay | Admitting: *Deleted

## 2022-09-16 NOTE — Telephone Encounter (Signed)
   Pre-operative Risk Assessment    Patient Name: Melissa [name]  DOB: 19-Jul-1965 MRN: [id number]      Request for Surgical Clearance    Procedure:   COLONOSCOPY  Date of Surgery:  Clearance 10/28/22                                 Surgeon:  DR. Ewing Schlein Surgeon's Group or Practice Name:  [facility] Phone number:  [phone] Fax number:  [phone]   Type of Clearance Requested:   - Medical  - Pharmacy:  Hold Rivaroxaban (Xarelto) x 3 DAYS PRIOR    Type of Anesthesia:   PROPOFOL   Additional requests/questions:    Elpidio Anis   09/16/2022, 4:56 PM

## 2022-09-16 NOTE — Telephone Encounter (Signed)
Left message to call back to schedule tele pre op appt as well as pt needs CBC.

## 2022-09-16 NOTE — Telephone Encounter (Addendum)
   Name: Melissa [name]  DOB: [birth year]  MRN: [id number]  Primary Cardiologist: Charlton Haws, MD   Preoperative team, please contact this patient and set up a phone call appointment for further preoperative risk assessment. Please obtain consent and complete medication review. Thank you for your help.  Patient will need updated CBC prior to having clearance granted for holding Xarelto.  Her procedure is scheduled for 10/2022 and televisit can be pushed to the end of July.  I confirm that guidance regarding antiplatelet and oral anticoagulation therapy has been completed and, if necessary, noted below.  Per office protocol, patient can hold Xarelto  for 1- 2 days prior to procedure.     Napoleon Form, Leodis Rains, NP 09/16/2022, 5:14 PM [facility]

## 2022-09-17 ENCOUNTER — Telehealth: Payer: Self-pay | Admitting: *Deleted

## 2022-09-17 ENCOUNTER — Encounter: Payer: Self-pay | Admitting: Cardiovascular Disease

## 2022-09-17 ENCOUNTER — Telehealth: Payer: Self-pay | Admitting: Cardiovascular Disease

## 2022-09-17 DIAGNOSIS — E7800 Pure hypercholesterolemia, unspecified: Secondary | ICD-10-CM

## 2022-09-17 NOTE — Telephone Encounter (Signed)
I s/w the pt and she has been scheduled for tele preop appt 10/12/22 @ 9 am. Med rec and consent are done.  Pt states she had CBC done with PCP May 7th, HGB 13.1. I felt that we would not need another CBC, though I did confirm this with the pre op Pharm-d as well, Laural Golden, RPH, no need to repeat CBC he found results in [facility].

## 2022-09-17 NOTE — Telephone Encounter (Signed)
Placed order for test. 

## 2022-09-17 NOTE — Telephone Encounter (Signed)
Pharmacy please advise on holding Xarelto prior to Colonoscopy scheduled for 10/28/22. Thank you.

## 2022-09-17 NOTE — Telephone Encounter (Signed)
Pt was returning Carol's call regarding clearance 10/28/2022. Please advise   (Refer to previous phone encounter)

## 2022-09-17 NOTE — Telephone Encounter (Signed)
-----   Message from Wendall Stade, MD sent at 09/17/2022  4:25 PM EDT ----- Order coronary calcium score on patient

## 2022-09-17 NOTE — Telephone Encounter (Signed)
Pt did call back before per the schedulers though I was another pt when she called back. I called back and left vm to call back to pre op, see notes from yesterday.

## 2022-09-17 NOTE — Telephone Encounter (Signed)
I s/w the pt and she has been scheduled for tele preop appt 10/12/22 @ 9 am. Med rec and consent are done.  Pt states she had CBC done with PCP May 7th, HGB 13.1. I felt that we would not need another CBC, though I did confirm this with the pre op Pharm-d as well, Laural Golden, RPH, no need to repeat CBC he found results in [facility].    Patient Consent for Virtual Visit        Melissa [name] has provided verbal consent on 09/17/2022 for a virtual visit (video or telephone).   CONSENT FOR VIRTUAL VISIT FOR:  Melissa [name]  By participating in this virtual visit I agree to the following:  I hereby voluntarily request, consent and authorize [facility] and its employed or contracted physicians, physician assistants, nurse practitioners or other licensed health care professionals (the Practitioner), to provide me with telemedicine health care services (the "Services") as deemed necessary by the treating Practitioner. I acknowledge and consent to receive the Services by the Practitioner via telemedicine. I understand that the telemedicine visit will involve communicating with the Practitioner through live audiovisual communication technology and the disclosure of certain medical information by electronic transmission. I acknowledge that I have been given the opportunity to request an in-person assessment or other available alternative prior to the telemedicine visit and am voluntarily participating in the telemedicine visit.  I understand that I have the right to withhold or withdraw my consent to the use of telemedicine in the course of my care at any time, without affecting my right to future care or treatment, and that the Practitioner or I may terminate the telemedicine visit at any time. I understand that I have the right to inspect all information obtained and/or recorded in the course of the telemedicine visit and may receive copies of available information for a reasonable fee.  I  understand that some of the potential risks of receiving the Services via telemedicine include:  Delay or interruption in medical evaluation due to technological equipment failure or disruption; Information transmitted may not be sufficient (e.g. poor resolution of images) to allow for appropriate medical decision making by the Practitioner; and/or  In rare instances, security protocols could fail, causing a breach of personal health information.  Furthermore, I acknowledge that it is my responsibility to provide information about my medical history, conditions and care that is complete and accurate to the best of my ability. I acknowledge that Practitioner's advice, recommendations, and/or decision may be based on factors not within their control, such as incomplete or inaccurate data provided by me or distortions of diagnostic images or specimens that may result from electronic transmissions. I understand that the practice of medicine is not an exact science and that Practitioner makes no warranties or guarantees regarding treatment outcomes. I acknowledge that a copy of this consent can be made available to me via my patient portal [facility] MyChart), or I can request a printed copy by calling the office of [facility].    I understand that my insurance will be billed for this visit.   I have read or had this consent read to me. I understand the contents of this consent, which adequately explains the benefits and risks of the Services being provided via telemedicine.  I have been provided ample opportunity to ask questions regarding this consent and the Services and have had my questions answered to my satisfaction. I give my informed consent for the  services to be provided through the use of telemedicine in my medical care

## 2022-09-17 NOTE — Telephone Encounter (Signed)
Patient with diagnosis of PAF on Xarelto for anticoagulation.    Procedure: COLONOSCOPY  Date of procedure: 10/28/2022   CHA2DS2-VASc Score = 2   This indicates a 2.2% annual risk of stroke. The patient's score is based upon: CHF History: 1 HTN History: 0 Diabetes History: 0 Stroke History: 0 Vascular Disease History: 0 Age Score: 0 Gender Score: 1       CrCl >90 ml/min (Sr Cr 0.62 on 04/27/2022 Platelet count 305 K    Per office protocol, patient can hold Xarelto  for 1- 2 days prior to procedure.     **This guidance is not considered finalized until pre-operative APP has relayed final recommendations.**

## 2022-09-29 ENCOUNTER — Other Ambulatory Visit (HOSPITAL_COMMUNITY): Payer: Self-pay

## 2022-09-29 MED ORDER — TIRZEPATIDE-WEIGHT MANAGEMENT 15 MG/0.5ML ~~LOC~~ SOAJ
15.0000 mg | SUBCUTANEOUS | 3 refills | Status: AC
  Filled 2022-09-29: qty 2, 28d supply, fill #0

## 2022-09-30 ENCOUNTER — Other Ambulatory Visit (HOSPITAL_COMMUNITY): Payer: Self-pay

## 2022-10-06 ENCOUNTER — Other Ambulatory Visit (HOSPITAL_COMMUNITY): Payer: Self-pay

## 2022-10-12 ENCOUNTER — Ambulatory Visit: Payer: 59 | Attending: Cardiovascular Disease | Admitting: Nurse Practitioner

## 2022-10-12 DIAGNOSIS — Z01810 Encounter for preprocedural cardiovascular examination: Secondary | ICD-10-CM | POA: Diagnosis not present

## 2022-10-12 NOTE — Progress Notes (Signed)
Virtual Visit via Telephone Note   Because of Melissa [name]'s co-morbid illnesses, she is at least at moderate risk for complications without adequate follow up.  This format is felt to be most appropriate for this patient at this time.  The patient did not have access to video technology/had technical difficulties with video requiring transitioning to audio format only (telephone).  All issues noted in this document were discussed and addressed.  No physical exam could be performed with this format.  Please refer to the patient's chart for her consent to telehealth for [facility].  Evaluation Performed:  Preoperative cardiovascular risk assessment _____________   Date:  10/12/2022   Patient ID:  Melissa [name], Melissa [name] 1965-12-27, MRN [id number] Patient Location:  Home Provider location:   Office  Primary Care Provider:  [name], MD Primary Cardiologist:  Charlton Haws, MD  Chief Complaint / Patient Profile   57 y.o. y/o female with a h/o atrial myxoma s/p resection, chronic diastolic heart failure, paroxysmal atrial fibrillation, and migraines who is pending colonoscopy on 10/28/2022 with Dr. Ewing Schlein of [facility] and presents today for telephonic preoperative cardiovascular risk assessment.  History of Present Illness    Melissa [name] is a 57 y.o. female who presents via audio/video conferencing for a telehealth visit today.  Pt was last seen in cardiology clinic on 01/14/2022 by Dr. [name].  At that time Melissa [name] was doing well.  The patient is now pending procedure as outlined above. Since her last visit, she has done well from a cardiac standpoint.   She denies chest pain, palpitations, dyspnea, pnd, orthopnea, n, v, dizziness, syncope, edema, weight gain, or early satiety. All other systems reviewed and are otherwise negative except as noted above.   Past Medical History    Past Medical History:  Diagnosis Date   (HFpEF) heart failure with preserved ejection  fraction (HCC)    ADD (attention deficit disorder)    Allergy    Arthritis    Asthma    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16   Depression    Family history of breast cancer    Family history of thyroid cancer    Family history of uterine cancer    Migraine    Osteoporosis    Pulmonary hypertension (HCC)    RLS (restless legs syndrome)    Past Surgical History:  Procedure Laterality Date   ABDOMINAL HYSTERECTOMY     CARDIOVERSION N/A 01/10/2017   Procedure: CARDIOVERSION;  Surgeon: Lars Masson, MD;  Location: [facility] ENDOSCOPY;  Service: Cardiovascular;  Laterality: N/A;   CHOLECYSTECTOMY     EXCISION OF ATRIAL MYXOMA Left 12/28/2016   Procedure: EXCISION OF LEFT ATRIAL MYXOMA;  Surgeon: Kerin Perna, MD;  Location: [facility] OR;  Service: Open Heart Surgery;  Laterality: Left;   FOOT SURGERY     GASTRIC BYPASS     INTRAOPERATIVE TRANSESOPHAGEAL ECHOCARDIOGRAM N/A 12/28/2016   Procedure: INTRAOPERATIVE TRANSESOPHAGEAL ECHOCARDIOGRAM;  Surgeon: Kerin Perna, MD;  Location: [facility] OR;  Service: Open Heart Surgery;  Laterality: N/A;   TEE WITHOUT CARDIOVERSION N/A 01/10/2017   Procedure: TRANSESOPHAGEAL ECHOCARDIOGRAM (TEE);  Surgeon: Lars Masson, MD;  Location: [facility] ENDOSCOPY;  Service: Cardiovascular;  Laterality: N/A;   TEE WITHOUT CARDIOVERSION N/A 05/17/2017   Procedure: TRANSESOPHAGEAL ECHOCARDIOGRAM (TEE);  Surgeon: Wendall Stade, MD;  Location: [facility] ENDOSCOPY;  Service: Cardiovascular;  Laterality: N/A;   TOTAL KNEE ARTHROPLASTY     TUBAL LIGATION      Allergies  Allergies  Allergen Reactions   Other Anaphylaxis and Other (See Comments)    HONEYDEW MELON    Home Medications    Prior to Admission medications   Medication Sig Start Date End Date Taking? Authorizing Provider  acetaZOLAMIDE (DIAMOX) 250 MG tablet Take 2 tablets (500 mg total) by mouth 2 (two) times daily. Patient not taking: Reported on 01/14/2022 05/22/19   Anson Fret, MD  AJOVY 225 MG/1.5ML SOAJ Inject 225 mg  into the skin every 30 (thirty) days. 03/17/20   [provider]  albuterol (VENTOLIN HFA) 108 (90 Base) MCG/ACT inhaler Inhale into the lungs. 07/17/20   [provider]  amitriptyline (ELAVIL) 25 MG tablet Take 1-2 tablets (25-50 mg total) by mouth at bedtime. Patient not taking: Reported on 01/14/2022 04/03/19   Anson Fret, MD  Cholecalciferol (VITAMIN D3) 125 MCG (5000 UT) CAPS Take 5,000 Units by mouth daily. Patient not taking: Reported on 01/14/2022    [provider]  diltiazem (CARDIZEM) 30 MG tablet Take 1 tablet (30 mg total) every 6 (six) hours as needed by mouth. For fast heart rate. Continue long acting diltiazem, total of no more than 360 mg per day 01/29/17 01/29/18  Jodelle Red, MD  [facility] 0.05 MG/24HR RING Place 0.05 mg vaginally every 3 (three) months. 11/21/19   [provider]  flecainide (TAMBOCOR) 50 MG tablet Take 1 tablet (50 mg total) by mouth 2 (two) times daily. 02/22/22 02/23/23  Wendall Stade, MD  furosemide (LASIX) 40 MG tablet As needed for weight gain 04/28/17   End, Cristal Deer, MD  HYDROcodone-acetaminophen (NORCO) 10-325 MG tablet Take 1 tablet by mouth 2 (two) times daily as needed for moderate pain. 03/31/19   [provider]  HYDROcodone-acetaminophen (NORCO/VICODIN) 5-325 MG tablet Take 1 tablet by mouth every 6 (six) hours as needed for severe pain. Patient not taking: Reported on 01/14/2022 04/15/20   Long, Arlyss Repress, MD  lidocaine (XYLOCAINE) 5 % ointment Apply 1 application topically 3 (three) times daily as needed for moderate pain. Patient not taking: Reported on 01/14/2022 04/15/20   Long, Arlyss Repress, MD  methocarbamol (ROBAXIN) 500 MG tablet Take 500 mg by mouth 2 (two) times daily. 12/22/21   [provider]  methylPREDNISolone (MEDROL DOSEPAK) 4 MG TBPK tablet follow package directions Patient not taking: Reported on 01/14/2022 05/10/19   Anson Fret, MD  metoprolol tartrate (LOPRESSOR)  25 MG tablet Take 1.5 tablets (37.5 mg total) by mouth 2 (two) times daily. 02/22/22 02/23/23  Wendall Stade, MD  Multiple Vitamin (MULTIVITAMIN) tablet Take 1 tablet by mouth 2 (two) times daily.    [provider]  NURTEC 75 MG TBDP Take 75 mg by mouth every other day. Patient not taking: Reported on 01/14/2022 03/07/20   [provider]  potassium chloride SA (K-DUR,KLOR-CON) 20 MEQ tablet As needed for weight gain with Lasix 04/28/17   End, Cristal Deer, MD  pramipexole (MIRAPEX) 1 MG tablet Take 1 mg by mouth 2 (two) times daily.  Patient not taking: Reported on 01/14/2022    [provider]  promethazine (PHENERGAN) 12.5 MG tablet Take 12.5-25 mg by mouth 3 (three) times daily as needed for nausea/vomiting. 12/17/16   [provider]  rivaroxaban (XARELTO) 20 MG TABS tablet Take 1 tablet (20 mg total) by mouth daily. 06/14/22   Wendall Stade, MD  rizatriptan (MAXALT-MLT) 10 MG disintegrating tablet Take 1 tablet (10 mg total) by mouth as needed for migraine. May repeat in 2 hours if  needed Patient not taking: Reported on 01/14/2022 04/17/19   Anson Fret, MD  tirzepatide [facility]) 15 MG/0.5ML Pen Inject 15 mg into the skin once a week. 09/29/22     tiZANidine (ZANAFLEX) 2 MG tablet Take 2 mg by mouth every 6 (six) hours as needed (headache).  Patient not taking: Reported on 01/14/2022 10/11/15   [provider]  topiramate (TOPAMAX) 25 MG tablet Take by mouth. Patient not taking: Reported on 01/14/2022 09/03/20   [provider]  triamcinolone cream (KENALOG) 0.1 % Apply topically as needed. 10/06/21   [provider]  TRUDHESA 0.725 MG/ACT AERS SMARTSIG:1 Spray(s) Both Nares 1-2 Times Daily Patient not taking: Reported on 01/14/2022 08/15/20   [provider]  TYMLOS 3120 MCG/1.56ML SOPN Inject into the skin. 12/29/21   [provider]  Vitamin D, Ergocalciferol, (DRISDOL) 1.25 MG (50000 UNIT) CAPS capsule Take  50,000 Units by mouth once a week. 01/12/22   [provider]    Physical Exam    Vital Signs:  Jaylina Ramdass does not have vital signs available for review today.  Given telephonic nature of communication, physical exam is limited. AAOx3. NAD. Normal affect.  Speech and respirations are unlabored.  Accessory Clinical Findings    None  Assessment & Plan    1.  Preoperative Cardiovascular Risk Assessment:  According to the Revised Cardiac Risk Index (RCRI), her Perioperative Risk of Major Cardiac Event is (%): 0.9. Her Functional Capacity in METs is: 7.99 according to the Duke Activity Status Index (DASI). Therefore, based on ACC/AHA guidelines, patient would be at acceptable risk for the planned procedure without further cardiovascular testing.   The patient was advised that if she develops new symptoms prior to surgery to contact our office to arrange for a follow-up visit, and she verbalized understanding.   Per office protocol, patient can hold Xarelto for 1- 2 days prior to procedure.  Please resume Xarelto as soon as possible postprocedure, at the discretion of the surgeon.     A copy of this note will be routed to requesting surgeon.  Time:   Today, I have spent 5 minutes with the patient with telehealth technology discussing medical history, symptoms, and management plan.     Joylene Grapes, NP  10/12/2022, 9:07 AM

## 2022-10-29 ENCOUNTER — Encounter: Payer: Self-pay | Admitting: Cardiovascular Disease

## 2022-10-29 ENCOUNTER — Ambulatory Visit (HOSPITAL_BASED_OUTPATIENT_CLINIC_OR_DEPARTMENT_OTHER)
Admission: RE | Admit: 2022-10-29 | Discharge: 2022-10-29 | Disposition: A | Discharge status: Home / Self Care | Payer: Self-pay | Source: Ambulatory Visit | Attending: Cardiovascular Disease | Admitting: Cardiovascular Disease

## 2022-10-29 ENCOUNTER — Telehealth: Payer: Self-pay

## 2022-10-29 DIAGNOSIS — E7800 Pure hypercholesterolemia, unspecified: Secondary | ICD-10-CM

## 2022-10-29 DIAGNOSIS — R931 Abnormal findings on diagnostic imaging of heart and coronary circulation: Secondary | ICD-10-CM

## 2022-10-29 DIAGNOSIS — I25118 Atherosclerotic heart disease of native coronary artery with other forms of angina pectoris: Secondary | ICD-10-CM

## 2022-10-29 NOTE — Telephone Encounter (Signed)
The patient has been notified of the result and verbalized understanding.  All questions (if any) were answered. Ethelda Chick, RN 10/29/2022 5:20 PM   Placed orders for testing. Will send instructions through mychart. Patient coming in on Monday for lab work.

## 2022-10-29 NOTE — Telephone Encounter (Signed)
-----   Message from Charlton Haws sent at 10/29/2022  3:57 PM EDT ----- Calcium score high for age needs lipid/liver order f/u lexiscan myovue

## 2022-11-01 ENCOUNTER — Ambulatory Visit: Payer: 59 | Attending: Cardiovascular Disease

## 2022-11-01 DIAGNOSIS — I25118 Atherosclerotic heart disease of native coronary artery with other forms of angina pectoris: Secondary | ICD-10-CM

## 2022-11-01 DIAGNOSIS — R931 Abnormal findings on diagnostic imaging of heart and coronary circulation: Secondary | ICD-10-CM

## 2022-11-01 DIAGNOSIS — E7800 Pure hypercholesterolemia, unspecified: Secondary | ICD-10-CM

## 2022-11-01 LAB — HEPATIC FUNCTION PANEL
ALT: 14 IU/L (ref 0–32)
AST: 17 IU/L (ref 0–40)
Albumin: 4.5 g/dL (ref 3.8–4.9)
Alkaline Phosphatase: 80 IU/L (ref 44–121)
Bilirubin Total: 0.4 mg/dL (ref 0.0–1.2)
Bilirubin, Direct: 0.11 mg/dL (ref 0.00–0.40)
Total Protein: 7 g/dL (ref 6.0–8.5)

## 2022-11-01 LAB — LIPID PANEL
Chol/HDL Ratio: 3.2 ratio (ref 0.0–4.4)
Cholesterol, Total: 217 mg/dL — ABNORMAL HIGH (ref 100–199)
HDL: 67 mg/dL (ref >39)
LDL Chol Calc (NIH): 133 mg/dL — ABNORMAL HIGH (ref 0–99)
Triglycerides: 98 mg/dL (ref 0–149)
VLDL Cholesterol Cal: 17 mg/dL (ref 5–40)

## 2022-11-02 ENCOUNTER — Telehealth: Payer: Self-pay

## 2022-11-02 MED ORDER — ATORVASTATIN CALCIUM 20 MG PO TABS: 20.0000 mg | ORAL_TABLET | Freq: Every day | ORAL | 11 refills | Status: DC

## 2022-11-02 NOTE — Telephone Encounter (Signed)
The patient has been notified of the result and verbalized understanding.  All questions (if any) were answered. Cindi Carbon [place], RN 11/02/2022 9:12 AM   Placed order for medication. Will place order for lab work at patient's office visit in October. Patient wants to see if she is tolerating medication first.

## 2022-11-02 NOTE — Telephone Encounter (Signed)
-----   Message from Charlton Haws sent at 11/02/2022  8:21 AM EDT ----- LDL high for calcium score start lipitor 20 mg and repeat labs in 3 months

## 2022-11-03 ENCOUNTER — Other Ambulatory Visit (HOSPITAL_COMMUNITY): Payer: Self-pay

## 2022-11-08 ENCOUNTER — Encounter: Payer: Self-pay | Admitting: Cardiovascular Disease

## 2022-11-10 ENCOUNTER — Ambulatory Visit (HOSPITAL_COMMUNITY): Payer: 59

## 2022-11-10 ENCOUNTER — Telehealth (HOSPITAL_COMMUNITY): Payer: Self-pay | Admitting: *Deleted

## 2022-11-10 NOTE — Telephone Encounter (Signed)
Patient given detailed instructions per Myocardial Perfusion Study Information Sheet for the test on 11/15/2022 at 8:15. Patient notified to arrive 15 minutes early and that it is imperative to arrive on time for appointment to keep from having the test rescheduled.  If you need to cancel or reschedule your appointment, please call the office within 24 hours of your appointment. . Patient verbalized understanding.Daneil Dolin

## 2022-11-12 ENCOUNTER — Ambulatory Visit (HOSPITAL_COMMUNITY): Payer: 59

## 2022-11-15 ENCOUNTER — Ambulatory Visit (HOSPITAL_COMMUNITY): Payer: 59 | Attending: Cardiology

## 2022-11-15 ENCOUNTER — Encounter (HOSPITAL_COMMUNITY): Payer: Self-pay

## 2022-11-15 DIAGNOSIS — R931 Abnormal findings on diagnostic imaging of heart and coronary circulation: Secondary | ICD-10-CM | POA: Diagnosis present

## 2022-11-15 DIAGNOSIS — E7800 Pure hypercholesterolemia, unspecified: Secondary | ICD-10-CM | POA: Insufficient documentation

## 2022-11-15 DIAGNOSIS — I25118 Atherosclerotic heart disease of native coronary artery with other forms of angina pectoris: Secondary | ICD-10-CM | POA: Insufficient documentation

## 2022-11-15 LAB — MYOCARDIAL PERFUSION IMAGING
Base ST Depression (mm): 0 mm
Estimated workload: 1
Exercise duration (min): 1 min
Exercise duration (sec): 0 s
LV dias vol: 86 mL (ref 46–106)
LV sys vol: 32 mL
MPHR: 162 {beats}/min
Nuc Stress EF: 63 %
Peak HR: 90 {beats}/min
Percent HR: 55 %
Rest HR: 74 {beats}/min
Rest Nuclear Isotope Dose: 10.6 mCi
SDS: 1
SRS: 0
SSS: 1
ST Depression (mm): 0 mm
Stress Nuclear Isotope Dose: 30.4 mCi
TID: 0.97

## 2022-11-15 MED ORDER — REGADENOSON 0.4 MG/5ML IV SOLN
0.4000 mg | Freq: Once | INTRAVENOUS | Status: AC
  Administered 2022-11-15: 0.4 mg via INTRAVENOUS

## 2022-11-15 MED ORDER — TECHNETIUM TC 99M TETROFOSMIN IV KIT
30.4000 | PACK | Freq: Once | INTRAVENOUS | Status: AC | PRN
  Administered 2022-11-15: 30.4 via INTRAVENOUS

## 2022-11-15 MED ORDER — TECHNETIUM TC 99M TETROFOSMIN IV KIT
10.6000 | PACK | Freq: Once | INTRAVENOUS | Status: AC | PRN
  Administered 2022-11-15: 10.6 via INTRAVENOUS

## 2022-11-17 ENCOUNTER — Ambulatory Visit (HOSPITAL_COMMUNITY): Payer: 59

## 2022-12-13 ENCOUNTER — Other Ambulatory Visit: Payer: Self-pay

## 2022-12-13 ENCOUNTER — Encounter: Payer: Self-pay | Admitting: Cardiovascular Disease

## 2022-12-13 DIAGNOSIS — I480 Paroxysmal atrial fibrillation: Secondary | ICD-10-CM

## 2022-12-13 MED ORDER — ATORVASTATIN CALCIUM 20 MG PO TABS: 20.0000 mg | ORAL_TABLET | Freq: Every day | ORAL | 0 refills | Status: DC

## 2022-12-13 MED ORDER — METOPROLOL TARTRATE 25 MG PO TABS: 37.5000 mg | ORAL_TABLET | Freq: Two times a day (BID) | ORAL | 0 refills | Status: DC

## 2022-12-13 MED ORDER — FLECAINIDE ACETATE 50 MG PO TABS
50.0000 mg | ORAL_TABLET | Freq: Two times a day (BID) | ORAL | 0 refills | Status: DC
Start: 2022-12-13 — End: 2023-02-22

## 2022-12-13 MED ORDER — RIVAROXABAN 20 MG PO TABS
20.0000 mg | ORAL_TABLET | Freq: Every day | ORAL | 1 refills | Status: DC
Start: 2022-12-13 — End: 2023-05-13

## 2022-12-13 NOTE — Telephone Encounter (Signed)
Prescription refill request for Xarelto received.  Indication: Afib  Last office visit: 01/14/22 Melissa [name])  Weight: 83.5kg Age: 57 Scr: 0.62 (04/27/22 via [facility])  CrCl: 130.36ml/min  Appropriate dose. Refill sent.

## 2022-12-30 NOTE — Progress Notes (Signed)
Cardiology Office Note:    Date:  01/11/2023   ID:  Melissa, [name] [birth year], MRN [id number]  PCP:  [name], MD  Cardiologist:  Charlton Haws, MD   Electrophysiologist:  None   Referring MD: [name], MD   No chief complaint on file.    History of Present Illness:    Melissa [name] is a 57 y.o. 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  She has been continued on anticoagulation with Rivaroxaban.  PASP has returned to normal since her resection.   She has an apple watch and has had some bouts of PAF and PAC;s since surgery     Lastt monitor done 01/27/18 showed no PAF. 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  got married in September 2024 to [name] his highschool sweetheart  Younger son Colon Branch  at [employer] and will finish up in December 2024   Having some cervical neck issues headaches with previous LP normal opening pressures  Seen in ED 07/10/20 with migraine Seeing neurology at [facility] Rx with migraine cocktail improved   She is taking estrogen which really helps her menopausal symptoms Since she is on anticoagulation risk of stroke should be less   Seen in ED 05/22/21 for atypical chest pain R/O  Having lower back pain MRI 10/23/21 with small left foraminal protrusion At L5-S2 stable since 2017  Seeing Dr Prince Rome now for concierge medicine as he is more responsive 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 Single 4 beat atrial run. No atrial fibrillation or other sustained arrhythmia.  Echo 02/07/17 EF 55-60, no RWMA, mild LAE, neg bubble study, mild TR, normal PASP  TEE 01/10/17 EF 55-60, no RWMA, trivial AI, mild MR, severe BAE, severe TR, PASP 48, interatrial patch significantly thickened with possible dehiscence and thrombus  R/L heart cath 12/27/16 LM normal LAD min irregs LCx ost 10 RCA normal  RA (mean): 11 mmHg RV (S/EDP): 71/13 mmHg PA (S/D, mean): 68/33 (47) mmHg PCWP (mean): 27 mmHg Ao sat: 99% PA sat: 73% Fick CO: 7.9 L/min Fick CI: 3.7 L/min/m^2 PVR: 2.5 Wood units Conclusions: Minimal atherosclerotic plaquing; no angiographically significant coronary artery disease. Hypervascular, mobile structure supplied predominantly by the right coronary artery, consistent with left atrial mass seen on recent echo. Moderately elevated left and right heart filling pressures. Severe pulmonary hypertension. Normal Fick cardiac output/index.  PreCABG Dopplers 12/27/16 1. Carotid Doppler Evaluation - findings are suggestive of 1-39%    diameter reduction bilaterally. There is moderate tortuosity of    the left internal carotid artery noted.  Echo 12/24/16 EF 55-60, no RWMA, mod MR, MV gradients severely elevated due to L atrial mass (mean 20), severea LAE, larg (2.9  x 4.5 cm) mass, mod TR, PASP 89  Past Medical History:  Diagnosis Date   (HFpEF) heart failure with preserved ejection fraction (HCC)    ADD (attention deficit disorder)    Allergy    Arthritis    Asthma    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16   Depression    Family history of breast cancer    Family history of thyroid cancer    Family history of uterine cancer    Migraine    Osteoporosis    Pulmonary hypertension (HCC)    RLS (restless legs syndrome)     Surgical Hx: The patient  has a past surgical history that includes Cholecystectomy; Tubal ligation; Abdominal hysterectomy; Foot surgery; Total knee arthroplasty; Gastric bypass; Excision of atrial myxoma (Left, 12/28/2016); Intraoprative transesophageal echocardiogram (N/A, 12/28/2016); TEE without cardioversion (N/A, 01/10/2017); Cardioversion (N/A, 01/10/2017); and TEE without cardioversion (N/A, 05/17/2017).   Current Medications: Current Meds  Medication Sig   acetaZOLAMIDE (DIAMOX) 250 MG tablet Take 2 tablets (500 mg total) by mouth 2 (two) times daily.   albuterol (VENTOLIN HFA) 108 (90 Base) MCG/ACT inhaler Inhale into the lungs.   atorvastatin (LIPITOR) 20 MG tablet Take 1 tablet (20 mg total) by mouth daily.   Cholecalciferol (VITAMIN D3) 125 MCG (5000 UT) CAPS Take 5,000 Units by mouth daily.   FEMRING 0.05 MG/24HR RING Place 0.05 mg vaginally every 3 (three) months.   flecainide (TAMBOCOR) 50 MG tablet Take 1 tablet (50 mg total) by mouth 2 (two) times daily.   furosemide (LASIX) 40 MG tablet As needed for weight gain   HYDROcodone-acetaminophen (NORCO) 10-325 MG tablet Take 1 tablet by mouth 2 (two) times daily as needed for moderate pain.   methocarbamol (ROBAXIN) 500 MG tablet Take 500 mg by mouth 2 (two) times daily.   metoprolol tartrate (LOPRESSOR) 25 MG tablet Take 1.5 tablets (37.5 mg total) by mouth 2 (two) times daily.   Multiple Vitamin (MULTIVITAMIN) tablet Take 1 tablet by mouth 2 (two) times daily.   potassium chloride SA (K-DUR,KLOR-CON) 20 MEQ tablet As needed for weight gain with Lasix   pramipexole (MIRAPEX) 1 MG tablet Take 1 mg by mouth 2 (two) times daily.   promethazine (PHENERGAN) 12.5 MG tablet Take 12.5-25 mg by mouth 3 (three) times daily as needed for nausea/vomiting.   rivaroxaban (XARELTO) 20 MG TABS tablet Take 1 tablet (20 mg total) by mouth daily.   rizatriptan (MAXALT-MLT) 10 MG disintegrating tablet Take 1 tablet (10 mg total) by mouth as needed  for migraine. May repeat in 2 hours if needed   tirzepatide (ZEPBOUND) 15 MG/0.5ML Pen Inject 15 mg into the skin once a week.   tiZANidine (ZANAFLEX) 2 MG tablet Take 2 mg by mouth every 6 (six) hours as needed (headache).   triamcinolone cream (KENALOG) 0.1 % Apply topically as needed.   TYMLOS 3120 MCG/1.56ML SOPN Inject into the skin.   Vitamin D, Ergocalciferol, (DRISDOL) 1.25 MG (50000 UNIT) CAPS capsule Take 50,000 Units by mouth once a week.     Allergies:   Other   Social History   Tobacco Use   Smoking status: Former    Current packs/day: 0.00    Average packs/day: 0.3 packs/day for 10.0 years (2.5 ttl pk-yrs)    Types: Cigarettes    Start date: 59    Quit date: 1997    Years since quitting: 27.8   Smokeless tobacco: Never  Vaping Use   Vaping status: Never Used  Substance Use Topics   Alcohol  use: Yes    Alcohol/week: 3.0 - 5.0 standard drinks of alcohol    Types: 3 - 5 Glasses of wine per week   Drug use: No     Family Hx: The patient's family history includes Breast cancer in her maternal aunt, maternal aunt, and maternal grandmother; Depression in an other family member; Endometrial cancer (age of onset: 5) in her sister; Stroke in her paternal grandfather; Thyroid cancer in her mother. There is no history of Migraines.  ROS:   Please see the history of present illness.    Review of Systems  Constitutional: Positive for diaphoresis.  Cardiovascular:  Positive for irregular heartbeat.  Musculoskeletal:  Positive for back pain and myalgias.  Neurological:  Positive for headaches.   All other systems reviewed and are negative.   EKGs/Labs/Other Test Reviewed:    EKG:   01/11/2023 NSR rate 63 nonspecific ST changes   Recent Labs: 11/01/2022: ALT 14   Recent Lipid Panel Lab Results  Component Value Date/Time   CHOL 217 (H) 11/01/2022 09:05 AM   TRIG 98 11/01/2022 09:05 AM   HDL 67 11/01/2022 09:05 AM   CHOLHDL 3.2 11/01/2022 09:05 AM   CHOLHDL 2  06/21/2012 04:05 PM   LDLCALC 133 (H) 11/01/2022 09:05 AM   LDLDIRECT 111.7 07/08/2008 08:39 AM    Physical Exam:    VS:  BP 100/78   Pulse 80   Ht 5\' 6"  (1.676 m)   Wt 178 lb (80.7 kg)   SpO2 98%   BMI 28.73 kg/m     Wt Readings from Last 3 Encounters:  01/11/23 178 lb (80.7 kg)  11/15/22 184 lb (83.5 kg)  01/14/22 257 lb 9.6 oz (116.8 kg)     Affect appropriate Overweight white female  HEENT: normal Neck supple with no adenopathy JVP normal no bruits no thyromegaly Lungs clear with no wheezing and good diaphragmatic motion Heart:  S1/S2  1/6/ SEM  murmur, no rub, gallop or click PMI normal post sternotomy  Abdomen: benighn, BS positve, no tenderness, no AAA no bruit.  No HSM or HJR Distal pulses intact with no bruits No edema Neuro non-focal Skin warm and dry No muscular weakness   ASSESSMENT & PLAN:    Paroxysmal atrial fibrillation (HCC) Continue flecainide, beta blocker seems improved On xarelto for anticoagulation She had no coronary disease on cath 12/27/16 prior  To myxoma surgery   History of atrial myxoma S/p resection.  Transesophageal echocardiogram in 04/2017 demonstrated intact pericardial septal patch and no residual myxoma. Will update TTE  Chronic heart failure with preserved ejection fraction (HCC) Overall, volume status stable.  Lasix as needed  .  See below TTE  Headaches:  F/u with neuro/primary Post LP and cervical spine films not very remarkable  Migraines f/u [facility] neurology   CAD:  high calcium score for age. On statin myovue non ischemic 11/15/22  HLD:  LDL 133 started on lipitor 20 mg 12/13/22 update labs she has not been compliant so will defer labs for another 3 months     Lipid/Liver 3 months    Dispo:  F/U in a year   Charlton Haws

## 2023-01-11 ENCOUNTER — Encounter: Payer: Self-pay | Admitting: Cardiovascular Disease

## 2023-01-11 ENCOUNTER — Ambulatory Visit: Payer: 59 | Attending: Cardiovascular Disease | Admitting: Cardiovascular Disease

## 2023-01-11 VITALS — BP 100/78 | HR 80 | Ht 66.0 in | Wt 178.0 lb

## 2023-01-11 DIAGNOSIS — I25118 Atherosclerotic heart disease of native coronary artery with other forms of angina pectoris: Secondary | ICD-10-CM

## 2023-01-11 DIAGNOSIS — I480 Paroxysmal atrial fibrillation: Secondary | ICD-10-CM | POA: Diagnosis not present

## 2023-01-11 DIAGNOSIS — E7800 Pure hypercholesterolemia, unspecified: Secondary | ICD-10-CM

## 2023-01-11 DIAGNOSIS — D151 Benign neoplasm of heart: Secondary | ICD-10-CM

## 2023-01-11 DIAGNOSIS — R931 Abnormal findings on diagnostic imaging of heart and coronary circulation: Secondary | ICD-10-CM | POA: Diagnosis not present

## 2023-01-11 NOTE — Patient Instructions (Signed)
Medication Instructions:  Your physician recommends that you continue on your current medications as directed. Please refer to the Current Medication list given to you today.  *If you need a refill on your cardiac medications before your next appointment, please call your pharmacy*   Lab Work: Your physician recommends that you return for lab work in: 3 mouths for fasting lipid and liver panel If you have labs (blood work) drawn today and your tests are completely normal, you will receive your results only by: MyChart Message (if you have MyChart) OR A paper copy in the mail If you have any lab test that is abnormal or we need to change your treatment, we will call you to review the results.   Testing/Procedures: None ordered today.   Follow-Up: At [facility], you and your health needs are our priority.  As part of our continuing mission to provide you with exceptional heart care, we have created designated Provider Care Teams.  These Care Teams include your primary Cardiologist (physician) and Advanced Practice Providers (APPs -  Physician Assistants and Nurse Practitioners) who all work together to provide you with the care you need, when you need it.  We recommend signing up for the patient portal called "MyChart".  Sign up information is provided on this After Visit Summary.  MyChart is used to connect with patients for Virtual Visits (Telemedicine).  Patients are able to view lab/test results, encounter notes, upcoming appointments, etc.  Non-urgent messages can be sent to your provider as well.   To learn more about what you can do with MyChart, go to [email or web address].    Your next appointment:   1 year(s)  Provider:   Charlton Haws, MD

## 2023-01-28 ENCOUNTER — Other Ambulatory Visit: Payer: Self-pay | Admitting: Medical Genetics

## 2023-01-28 DIAGNOSIS — Z006 Encounter for examination for normal comparison and control in clinical research program: Secondary | ICD-10-CM

## 2023-02-18 ENCOUNTER — Other Ambulatory Visit: Payer: Self-pay | Admitting: Cardiovascular Disease

## 2023-02-18 DIAGNOSIS — I480 Paroxysmal atrial fibrillation: Secondary | ICD-10-CM

## 2023-02-21 ENCOUNTER — Encounter: Payer: Self-pay | Admitting: Cardiovascular Disease

## 2023-02-23 ENCOUNTER — Other Ambulatory Visit (HOSPITAL_COMMUNITY)
Admission: RE | Admit: 2023-02-23 | Discharge: 2023-02-23 | Disposition: A | Discharge status: Home / Self Care | Payer: 59 | Source: Ambulatory Visit | Attending: Oncology | Admitting: Oncology

## 2023-02-23 DIAGNOSIS — Z006 Encounter for examination for normal comparison and control in clinical research program: Secondary | ICD-10-CM | POA: Insufficient documentation

## 2023-03-03 ENCOUNTER — Other Ambulatory Visit: Payer: Self-pay | Admitting: Cardiovascular Disease

## 2023-03-07 LAB — GENECONNECT MOLECULAR SCREEN: Genetic Analysis Overall Interpretation: NEGATIVE

## 2023-05-13 ENCOUNTER — Other Ambulatory Visit: Payer: Self-pay | Admitting: Cardiovascular Disease

## 2023-05-13 DIAGNOSIS — I480 Paroxysmal atrial fibrillation: Secondary | ICD-10-CM

## 2023-05-13 NOTE — Telephone Encounter (Signed)
Prescription refill request for Xarelto received.  Indication:afib Last office visit:10/24 Weight:80.7  kg Age:58 Scr:0.63  5/24 CrCl:122.49  ml/min  Prescription refilled

## 2023-10-18 ENCOUNTER — Encounter: Payer: Self-pay | Admitting: Cardiovascular Disease

## 2023-10-20 MED ORDER — AMOXICILLIN 500 MG PO TABS: ORAL_TABLET | ORAL | 2 refills | Status: DC

## 2023-11-24 ENCOUNTER — Encounter: Payer: Self-pay | Admitting: Cardiovascular Disease

## 2023-11-25 MED ORDER — AZITHROMYCIN 500 MG PO TABS: ORAL_TABLET | ORAL | 1 refills | Status: AC

## 2023-11-30 ENCOUNTER — Encounter: Payer: Self-pay | Admitting: Cardiovascular Disease

## 2024-04-04 NOTE — Progress Notes (Signed)
 Date of service:.04/04/2024  PROCEDURE: Thoracic facet radiofrequency ablation with fluoroscopy  SURGEON: Rockey Pae, MD  PREOPERATIVE DIAGNOSES:  1.Thoracic back pain without radiculopathy chronic. 2.  Thoracic facet arthropathy chronic 3.  Thoracic spondylosis  POSTOPERATIVE DIAGNOSES: 1.Thoracic back pain without radiculopathy chronic. 2.  Thoracic facet arthropathy chronic 3.  Thoracic spondylosis  LEVELS TREATED: Right T8-9 and T9-10 with needles at T8, 9, 10   COMPLICATIONS: None  THERAPEUTIC AGENT: 1cc 2% Lidocaine  per needle + 10mg  dexamethasone    BLOOD LOSS: minimal  COMPLICATIONS: none  Needle: 18g, with 10mm active tip  Anesthesia: Local  DESCRIPTION OF PROCEDURE: Patient was identified in the exam room prior to procedure.  Informed consent including discussion of risks(including pneumothorax risk and warning signs), benefits and alternatives procedure obtained.  Patient was then positioned in the prone position.  The cervical lumbar thoracic area and prepped and draped in sterile fashion with sterilizing agent.  AP fluoroscopy was then used to identify the above treated levels.  The junction of the rib in the vertebral body was then identified and marked.  For all above-mentioned levels.  1% lidocaine  was then used for local anesthesia.  A radiofrequency needle was advanced toward this junction using a written fluoroscopic guidance.  Once the needle was appropriately positioned adjacent to the facet joint, the procedure was repeated at the above-mentioned levels in the exact same manner list above.  Once all needles were appropriately positioned adjacent to the facet joints, each one was aspirated was negative for heme or air.  Each needle was then motor tested at 2Hz  and 2 Volts which showed appropriate stimulation.  Next, each needle was injected with therapeutic agent.  Radiofrequency lesioning was then preformed at 80 degrees Celsius for 90 seconds. At the  completion of the lesioning needles were withdrawn intact.  Patient was observed in recovery without complication.  Patient was discharged home in stable condition.  ADDENDUM: Fluoroscopy time and Exposure, mRad were recorded and scanned into chart for procedure. Image count stored.  Katrin Grabel has undergone two sets of MBB at the same levels, >/= two weeks apart with >80% improvement in pain and an improvement in the ability to perform previously painful movements and activities of daily living (ADLs) after each set of injections.  The plan is now to proceed with radiofrequency ablation of the previously treated facet levels.

## 2024-04-09 NOTE — Progress Notes (Signed)
 " Cardiology Office Note:    Date:  04/19/2024   ID:  Melissa [name], DOB 02/27/1966, MRN [id number]  PCP:  Hughie Sharper, MD  Cardiologist:  Maude Emmer, MD   Electrophysiologist:  None   Referring MD: Douglass Ivanoff, MD   Chief Complaint  Patient presents with   Follow-up    1 year. No complaints.     History of Present Illness:    Melissa [name] is a 59 y.o. 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  She has been continued on anticoagulation with Rivaroxaban .  PASP has returned to normal since her resection.   She has an apple watch and has had some bouts of PAF and PAC;s since surgery     Lastt monitor done 01/27/18 showed no PAF. TEE 05/17/17 with intact septal patch and no recurrent atrial myxoma  Limited palpitations only at night when she first lays down Has not had to take PRN cardizem    Son Franchot transferred to [employer]  and now is in fire department  got married in September 2024 to Sydney his highschool Civil Engineer, Contracting some woodcrafts at [employer] younger son doing well He has a health administration degree from [employer] and is working at [employer] in [place]   Having some cervical neck issues headaches with previous LP normal opening pressures  Seen in ED 07/10/20 with migraine Seeing neurology at [facility] Rx with migraine cocktail improved   She is taking estrogen which really helps her menopausal symptoms Since she is on anticoagulation risk of stroke should be less   Seen in ED 05/22/21 for atypical chest pain R/O  Having lower back pain MRI 10/23/21 with small left foraminal protrusion At L5-S2 stable since 2017 Has been getting lidocaine  and dexamethasone  injections for her back pain including thoracic facet radiofrequency ablation. 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 Single 4 beat atrial run. No atrial fibrillation or other sustained arrhythmia.  Echo 02/07/17 EF 55-60, no RWMA, mild LAE, neg bubble study, mild TR, normal PASP  TEE 01/10/17 EF 55-60, no RWMA, trivial AI, mild MR, severe BAE, severe TR, PASP 48, interatrial patch significantly thickened with possible dehiscence and thrombus  R/L heart cath 12/27/16 LM normal LAD min irregs LCx ost 10 RCA normal  RA (mean): 11 mmHg RV (S/EDP): 71/13 mmHg PA (S/D, mean): 68/33 (47) mmHg PCWP (mean): 27 mmHg Ao sat: 99% PA sat: 73% Fick CO: 7.9 L/min Fick CI: 3.7 L/min/m^2 PVR: 2.5 Wood units Conclusions: Minimal atherosclerotic plaquing; no angiographically significant coronary artery disease. Hypervascular, mobile structure supplied predominantly by the right coronary artery, consistent with left atrial mass seen on recent echo. Moderately elevated left and right heart filling pressures. Severe pulmonary hypertension. Normal Fick cardiac output/index.  PreCABG Dopplers 12/27/16 1. Carotid Doppler Evaluation -  findings are suggestive of 1-39%    diameter reduction bilaterally. There is moderate tortuosity of    the left internal carotid artery noted.  Echo 12/24/16 EF 55-60, no RWMA, mod MR, MV gradients severely elevated due to L atrial mass (mean 20), severea LAE, larg (2.9 x 4.5 cm) mass, mod TR, PASP 89  Past Medical History:  Diagnosis Date   (HFpEF) heart failure with preserved ejection fraction (HCC)     ADD (attention deficit disorder)    Allergy    Arthritis    Asthma    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16   Depression    Family history of breast cancer    Family history of thyroid  cancer    Family history of uterine cancer    Migraine    Osteoporosis    Pulmonary hypertension (HCC)    RLS (restless legs syndrome)    Surgical Hx: The patient  has a past surgical history that includes Cholecystectomy; Tubal ligation; Abdominal hysterectomy; Foot surgery; Total knee arthroplasty; Gastric bypass; Excision of atrial myxoma (Left, 12/28/2016); Intraoprative transesophageal echocardiogram (N/A, 12/28/2016); TEE without cardioversion (N/A, 01/10/2017); Cardioversion (N/A, 01/10/2017); and TEE without cardioversion (N/A, 05/17/2017).   Current Medications: Current Meds  Medication Sig   albuterol  (VENTOLIN  HFA) 108 (90 Base) MCG/ACT inhaler Inhale into the lungs.   atorvastatin  (LIPITOR) 20 MG tablet TAKE 1 TABLET BY MOUTH DAILY   azithromycin  (ZITHROMAX ) 500 MG tablet Take 500 mg (1 tablet) by mouth, one hour prior to any dental procedures   Cholecalciferol (VITAMIN D3) 125 MCG (5000 UT) CAPS Take 5,000 Units by mouth daily.   diltiazem  (CARDIZEM ) 30 MG tablet Take 1 tablet (30 mg total) every 6 (six) hours as needed by mouth. For fast heart rate. Continue long acting diltiazem , total of no more than 360 mg per day   FEMRING 0.05 MG/24HR RING Place 0.05 mg vaginally every 3 (three) months.   flecainide  (TAMBOCOR ) 50 MG tablet TAKE 1 TABLET BY MOUTH TWICE  DAILY   furosemide  (LASIX ) 40 MG tablet As needed for weight gain   HYDROcodone -acetaminophen  (NORCO) 10-325 MG tablet Take 1 tablet by mouth 2 (two) times daily as needed for moderate pain.   methocarbamol (ROBAXIN) 500 MG tablet Take 500 mg by mouth 2 (two) times daily.   metoprolol  tartrate (LOPRESSOR ) 25 MG tablet TAKE 1 AND 1/2 TABLETS BY MOUTH  TWICE DAILY   Multiple Vitamin (MULTIVITAMIN) tablet Take 1 tablet by mouth 2 (two) times daily.    potassium chloride  SA (K-DUR,KLOR-CON ) 20 MEQ tablet As needed for weight gain with Lasix    pramipexole  (MIRAPEX ) 1 MG tablet Take 1 mg by mouth 2 (two) times daily.   promethazine  (PHENERGAN ) 12.5 MG tablet Take 12.5-25 mg by mouth 3 (three) times daily as needed for nausea/vomiting.   rizatriptan  (MAXALT -MLT) 10 MG disintegrating tablet Take 1 tablet (10 mg total) by mouth as needed for migraine. May repeat in 2 hours if needed   tirzepatide  (ZEPBOUND ) 15 MG/0.5ML Pen Inject 15 mg into the skin once a week.   tiZANidine  (ZANAFLEX ) 2 MG tablet Take 2 mg by mouth every 6 (six) hours as needed (headache).   triamcinolone cream (KENALOG) 0.1 % Apply topically as needed.   TYMLOS 3120 MCG/1.56ML SOPN Inject into the skin.   Vitamin D , Ergocalciferol , (DRISDOL) 1.25 MG (50000 UNIT) CAPS capsule Take 50,000 Units by mouth once a week.   XARELTO  20 MG TABS tablet TAKE 1 TABLET BY MOUTH DAILY     Allergies:   Other  Social History   Tobacco Use   Smoking status: Former    Current packs/day: 0.00    Average packs/day: 0.3 packs/day for 10.0 years (2.5 ttl pk-yrs)    Types: Cigarettes    Start date: 22    Quit date: 1997    Years since quitting: 29.0   Smokeless tobacco: Never  Vaping Use   Vaping status: Never Used  Substance Use Topics   Alcohol use: Yes    Alcohol/week: 3.0 - 5.0 standard drinks of alcohol    Types: 3 - 5 Glasses of wine per week   Drug use: No     Family Hx: The patient's family history includes Breast cancer in her maternal aunt, maternal aunt, and maternal grandmother; Depression in an other family member; Endometrial cancer (age of onset: 36) in her sister; Stroke in her paternal grandfather; Thyroid  cancer in her mother. There is no history of Migraines.  ROS:   Please see the history of present illness.    Review of Systems  Constitutional: Positive for diaphoresis.  Cardiovascular:  Positive for irregular heartbeat.  Musculoskeletal:  Positive for back  pain and myalgias.  Neurological:  Positive for headaches.   All other systems reviewed and are negative.   EKGs/Labs/Other Test Reviewed:    EKG:   04/19/2024 NSR rate 63 nonspecific ST changes   Recent Labs: No results found for requested labs within last 365 days.   Recent Lipid Panel Lab Results  Component Value Date/Time   CHOL 217 (H) 11/01/2022 09:05 AM   TRIG 98 11/01/2022 09:05 AM   HDL 67 11/01/2022 09:05 AM   CHOLHDL 3.2 11/01/2022 09:05 AM   CHOLHDL 2 06/21/2012 04:05 PM   LDLCALC 133 (H) 11/01/2022 09:05 AM   LDLDIRECT 111.7 07/08/2008 08:39 AM    Physical Exam:    VS:  BP 122/80 (BP Location: Left Arm, Patient Position: Sitting, Cuff Size: Normal)   Pulse 82   Ht 5' 6 (1.676 m)   Wt 151 lb (68.5 kg)   BMI 24.37 kg/m     Wt Readings from Last 3 Encounters:  04/19/24 151 lb (68.5 kg)  01/11/23 178 lb (80.7 kg)  11/15/22 184 lb (83.5 kg)     Affect appropriate Overweight white female  HEENT: normal Neck supple with no adenopathy JVP normal no bruits no thyromegaly Lungs clear with no wheezing and good diaphragmatic motion Heart:  S1/S2  1/6/ SEM  murmur, no rub, gallop or click PMI normal post sternotomy  Abdomen: benighn, BS positve, no tenderness, no AAA no bruit.  No HSM or HJR Distal pulses intact with no bruits No edema Neuro non-focal Skin warm and dry No muscular weakness   ASSESSMENT & PLAN:    Paroxysmal atrial fibrillation (HCC) Continue flecainide , beta blocker seems improved On xarelto  for anticoagulation She had no coronary disease on cath 12/27/16 prior  To myxoma surgery   History of atrial myxoma S/p resection.  Transesophageal echocardiogram in 04/2017 demonstrated intact pericardial septal patch and no residual myxoma.   Chronic heart failure with preserved ejection fraction (HCC) Overall, volume status stable.  Lasix  as needed  .    Headaches:  F/u with neuro/primary Post LP and cervical spine films not very remarkable   Migraines f/u [facility] neurology   CAD:  high calcium  score for age. On statin myovue non ischemic 11/15/22  HLD:  LDL 133 started on lipitor 20 mg 12/13/22 update labs she has not been compliant so will defer labs for another  3 months    Back Pain:  has had injections and radiofrequency ablation f/u with IR and ortho     Dispo:  F/U in a year   Maude Emmer    "

## 2024-04-19 ENCOUNTER — Ambulatory Visit: Attending: Cardiovascular Disease | Admitting: Cardiovascular Disease

## 2024-04-19 ENCOUNTER — Encounter: Payer: Self-pay | Admitting: Cardiovascular Disease

## 2024-04-19 VITALS — BP 122/80 | HR 82 | Ht 66.0 in | Wt 151.0 lb

## 2024-04-19 DIAGNOSIS — I480 Paroxysmal atrial fibrillation: Secondary | ICD-10-CM

## 2024-04-19 DIAGNOSIS — D151 Benign neoplasm of heart: Secondary | ICD-10-CM

## 2024-04-19 DIAGNOSIS — R931 Abnormal findings on diagnostic imaging of heart and coronary circulation: Secondary | ICD-10-CM

## 2024-04-19 NOTE — Patient Instructions (Addendum)
 Medication Instructions: Your physician recommends that you continue on your current medications as directed. Please refer to the Current Medication list given to you today.   *If you need a refill on your cardiac medications before your next appointment, please call your pharmacy*  Lab Work: NONE    Testing/Procedures: NONE    Follow-Up: At [facility], you and your health needs are our priority.  As part of our continuing mission to provide you with exceptional heart care, our providers are all part of one team.  This team includes your primary Cardiologist (physician) and Advanced Practice Providers or APPs (Physician Assistants and Nurse Practitioners) who all work together to provide you with the care you need, when you need it.  Your next appointment:   1 year(s)  Provider:   Maude Emmer, MD     Other Instructions

## 2024-04-25 ENCOUNTER — Other Ambulatory Visit: Payer: Self-pay | Admitting: Cardiovascular Disease

## 2024-04-25 DIAGNOSIS — I480 Paroxysmal atrial fibrillation: Secondary | ICD-10-CM

## 2024-04-27 NOTE — Telephone Encounter (Signed)
 Pt seen 01/29. Okay to continue per Dr. Claiborne note. Most recent labs found in [facility] from 09/06/23. Refill sent.
